# Patient Record
Sex: Male | Born: 1935 | Race: Black or African American | Hispanic: No | Marital: Married | Smoking: Former smoker
Health system: Southern US, Community
[De-identification: ages and names within clinical notes are randomized; demographics above are authoritative.]

## PROBLEM LIST (undated history)

## (undated) DIAGNOSIS — I10 Essential (primary) hypertension: Secondary | ICD-10-CM

## (undated) DIAGNOSIS — N400 Enlarged prostate without lower urinary tract symptoms: Secondary | ICD-10-CM

## (undated) DIAGNOSIS — Z720 Tobacco use: Secondary | ICD-10-CM

## (undated) DIAGNOSIS — F1910 Other psychoactive substance abuse, uncomplicated: Secondary | ICD-10-CM

## (undated) DIAGNOSIS — C8190 Hodgkin lymphoma, unspecified, unspecified site: Secondary | ICD-10-CM

## (undated) DIAGNOSIS — IMO0001 Reserved for inherently not codable concepts without codable children: Secondary | ICD-10-CM

## (undated) DIAGNOSIS — Z86010 Personal history of colonic polyps: Secondary | ICD-10-CM

## (undated) DIAGNOSIS — J449 Chronic obstructive pulmonary disease, unspecified: Secondary | ICD-10-CM

## (undated) DIAGNOSIS — R011 Cardiac murmur, unspecified: Secondary | ICD-10-CM

## (undated) DIAGNOSIS — F1011 Alcohol abuse, in remission: Secondary | ICD-10-CM

## (undated) DIAGNOSIS — Z862 Personal history of diseases of the blood and blood-forming organs and certain disorders involving the immune mechanism: Secondary | ICD-10-CM

## (undated) DIAGNOSIS — Z8719 Personal history of other diseases of the digestive system: Secondary | ICD-10-CM

## (undated) DIAGNOSIS — M1990 Unspecified osteoarthritis, unspecified site: Secondary | ICD-10-CM

## (undated) DIAGNOSIS — F32A Depression, unspecified: Secondary | ICD-10-CM

## (undated) DIAGNOSIS — K219 Gastro-esophageal reflux disease without esophagitis: Secondary | ICD-10-CM

## (undated) DIAGNOSIS — I219 Acute myocardial infarction, unspecified: Secondary | ICD-10-CM

## (undated) DIAGNOSIS — K5790 Diverticulosis of intestine, part unspecified, without perforation or abscess without bleeding: Secondary | ICD-10-CM

## (undated) DIAGNOSIS — K644 Residual hemorrhoidal skin tags: Secondary | ICD-10-CM

## (undated) DIAGNOSIS — E785 Hyperlipidemia, unspecified: Secondary | ICD-10-CM

## (undated) DIAGNOSIS — F329 Major depressive disorder, single episode, unspecified: Secondary | ICD-10-CM

## (undated) DIAGNOSIS — K279 Peptic ulcer, site unspecified, unspecified as acute or chronic, without hemorrhage or perforation: Secondary | ICD-10-CM

## (undated) DIAGNOSIS — K5900 Constipation, unspecified: Secondary | ICD-10-CM

## (undated) HISTORY — DX: Personal history of diseases of the blood and blood-forming organs and certain disorders involving the immune mechanism: Z86.2

## (undated) HISTORY — DX: Benign prostatic hyperplasia without lower urinary tract symptoms: N40.0

## (undated) HISTORY — DX: Unspecified osteoarthritis, unspecified site: M19.90

## (undated) HISTORY — DX: Tobacco use: Z72.0

## (undated) HISTORY — DX: Other psychoactive substance abuse, uncomplicated: F19.10

## (undated) HISTORY — DX: Alcohol abuse, in remission: F10.11

## (undated) HISTORY — DX: Hodgkin lymphoma, unspecified, unspecified site: C81.90

## (undated) HISTORY — DX: Cardiac murmur, unspecified: R01.1

## (undated) HISTORY — PX: ESOPHAGEAL DILATION: SHX303

## (undated) HISTORY — DX: Residual hemorrhoidal skin tags: K64.4

## (undated) HISTORY — DX: Chronic obstructive pulmonary disease, unspecified: J44.9

## (undated) HISTORY — DX: Peptic ulcer, site unspecified, unspecified as acute or chronic, without hemorrhage or perforation: K27.9

## (undated) HISTORY — DX: Hyperlipidemia, unspecified: E78.5

## (undated) HISTORY — PX: INGUINAL HERNIA REPAIR: SHX194

## (undated) HISTORY — DX: Essential (primary) hypertension: I10

## (undated) HISTORY — PX: OTHER SURGICAL HISTORY: SHX169

## (undated) HISTORY — DX: Diverticulosis of intestine, part unspecified, without perforation or abscess without bleeding: K57.90

## (undated) HISTORY — DX: Personal history of colonic polyps: Z86.010

## (undated) HISTORY — DX: Gastro-esophageal reflux disease without esophagitis: K21.9

## (undated) HISTORY — DX: Acute myocardial infarction, unspecified: I21.9

---

## 1997-12-29 ENCOUNTER — Emergency Department (HOSPITAL_COMMUNITY): Admission: EM | Admit: 1997-12-29 | Discharge: 1997-12-29 | Payer: Self-pay | Admitting: Emergency Medicine

## 1998-01-11 ENCOUNTER — Ambulatory Visit (HOSPITAL_COMMUNITY): Admission: RE | Admit: 1998-01-11 | Discharge: 1998-01-11 | Payer: Self-pay | Admitting: General Surgery

## 1999-02-20 ENCOUNTER — Encounter: Payer: Self-pay | Admitting: Cardiology

## 1999-02-20 ENCOUNTER — Ambulatory Visit (HOSPITAL_COMMUNITY): Admission: RE | Admit: 1999-02-20 | Discharge: 1999-02-20 | Payer: Self-pay | Admitting: Cardiology

## 2001-09-08 ENCOUNTER — Encounter: Payer: Self-pay | Admitting: Emergency Medicine

## 2001-09-08 ENCOUNTER — Inpatient Hospital Stay (HOSPITAL_COMMUNITY): Admission: EM | Admit: 2001-09-08 | Discharge: 2001-09-09 | Payer: Self-pay | Admitting: Emergency Medicine

## 2001-09-09 ENCOUNTER — Encounter: Payer: Self-pay | Admitting: Cardiology

## 2001-09-27 ENCOUNTER — Ambulatory Visit (HOSPITAL_COMMUNITY): Admission: RE | Admit: 2001-09-27 | Discharge: 2001-09-27 | Payer: Self-pay | Admitting: Cardiology

## 2002-03-08 HISTORY — PX: UPPER GASTROINTESTINAL ENDOSCOPY: SHX188

## 2002-03-08 HISTORY — PX: COLONOSCOPY: SHX5424

## 2002-08-16 ENCOUNTER — Encounter: Payer: Self-pay | Admitting: Internal Medicine

## 2002-08-16 ENCOUNTER — Encounter: Admission: RE | Admit: 2002-08-16 | Discharge: 2002-08-16 | Payer: Self-pay | Admitting: Internal Medicine

## 2008-12-23 ENCOUNTER — Emergency Department (HOSPITAL_COMMUNITY): Admission: EM | Admit: 2008-12-23 | Discharge: 2008-12-23 | Payer: Self-pay | Admitting: Emergency Medicine

## 2009-04-27 HISTORY — PX: LUNG SURGERY: SHX703

## 2009-05-20 ENCOUNTER — Encounter (HOSPITAL_COMMUNITY)
Admission: RE | Admit: 2009-05-20 | Discharge: 2009-08-02 | Payer: Self-pay | Source: Home / Self Care | Admitting: Cardiology

## 2009-07-23 ENCOUNTER — Ambulatory Visit (HOSPITAL_COMMUNITY): Admission: RE | Admit: 2009-07-23 | Discharge: 2009-07-23 | Payer: Self-pay | Admitting: Cardiology

## 2009-07-26 DIAGNOSIS — C8190 Hodgkin lymphoma, unspecified, unspecified site: Secondary | ICD-10-CM

## 2009-07-26 HISTORY — DX: Hodgkin lymphoma, unspecified, unspecified site: C81.90

## 2009-07-30 ENCOUNTER — Observation Stay (HOSPITAL_COMMUNITY): Admission: EM | Admit: 2009-07-30 | Discharge: 2009-08-04 | Payer: Self-pay | Admitting: Emergency Medicine

## 2009-07-31 ENCOUNTER — Encounter (INDEPENDENT_AMBULATORY_CARE_PROVIDER_SITE_OTHER): Payer: Self-pay | Admitting: Internal Medicine

## 2009-11-19 ENCOUNTER — Ambulatory Visit: Payer: Self-pay | Admitting: Oncology

## 2009-11-19 ENCOUNTER — Ambulatory Visit: Payer: Self-pay | Admitting: Pulmonary Disease

## 2009-11-19 ENCOUNTER — Inpatient Hospital Stay (HOSPITAL_COMMUNITY): Admission: EM | Admit: 2009-11-19 | Discharge: 2009-11-26 | Payer: Self-pay | Admitting: Emergency Medicine

## 2009-11-21 ENCOUNTER — Ambulatory Visit: Payer: Self-pay | Admitting: Cardiothoracic Surgery

## 2009-11-22 ENCOUNTER — Encounter: Payer: Self-pay | Admitting: Cardiothoracic Surgery

## 2009-11-26 ENCOUNTER — Ambulatory Visit: Payer: Self-pay | Admitting: Oncology

## 2009-12-02 ENCOUNTER — Ambulatory Visit: Payer: Self-pay | Admitting: Oncology

## 2009-12-02 LAB — CBC WITH DIFFERENTIAL/PLATELET
BASO%: 0.3 % (ref 0.0–2.0)
Basophils Absolute: 0 10*3/uL (ref 0.0–0.1)
EOS%: 2 % (ref 0.0–7.0)
HCT: 34.8 % — ABNORMAL LOW (ref 38.4–49.9)
HGB: 10.9 g/dL — ABNORMAL LOW (ref 13.0–17.1)
MCH: 28.6 pg (ref 27.2–33.4)
MONO#: 0.7 10*3/uL (ref 0.1–0.9)
NEUT%: 59.2 % (ref 39.0–75.0)
RDW: 14.9 % — ABNORMAL HIGH (ref 11.0–14.6)
WBC: 6.9 10*3/uL (ref 4.0–10.3)
lymph#: 1.9 10*3/uL (ref 0.9–3.3)

## 2009-12-02 LAB — URIC ACID: Uric Acid, Serum: 6.2 mg/dL (ref 4.0–7.8)

## 2009-12-02 LAB — COMPREHENSIVE METABOLIC PANEL
ALT: 40 U/L (ref 0–53)
AST: 18 U/L (ref 0–37)
CO2: 29 mEq/L (ref 19–32)
Creatinine, Ser: 0.99 mg/dL (ref 0.40–1.50)
Sodium: 140 mEq/L (ref 135–145)
Total Bilirubin: 0.3 mg/dL (ref 0.3–1.2)
Total Protein: 6.6 g/dL (ref 6.0–8.3)

## 2009-12-02 LAB — LACTATE DEHYDROGENASE: LDH: 210 U/L (ref 94–250)

## 2009-12-03 ENCOUNTER — Ambulatory Visit (HOSPITAL_COMMUNITY): Admission: RE | Admit: 2009-12-03 | Discharge: 2009-12-03 | Payer: Self-pay | Admitting: Cardiothoracic Surgery

## 2009-12-05 ENCOUNTER — Ambulatory Visit: Payer: Self-pay | Admitting: Cardiothoracic Surgery

## 2009-12-11 ENCOUNTER — Ambulatory Visit: Payer: Self-pay | Admitting: Cardiothoracic Surgery

## 2009-12-11 ENCOUNTER — Encounter: Admission: RE | Admit: 2009-12-11 | Discharge: 2009-12-11 | Payer: Self-pay | Admitting: Cardiothoracic Surgery

## 2010-01-14 ENCOUNTER — Ambulatory Visit: Payer: Self-pay | Admitting: Oncology

## 2010-01-17 LAB — CBC WITH DIFFERENTIAL/PLATELET
HCT: 38.9 % (ref 38.4–49.9)
HGB: 12.3 g/dL — ABNORMAL LOW (ref 13.0–17.1)
LYMPH%: 50.4 % — ABNORMAL HIGH (ref 14.0–49.0)
MCH: 28.3 pg (ref 27.2–33.4)
NEUT#: 2 10*3/uL (ref 1.5–6.5)
NEUT%: 37.4 % — ABNORMAL LOW (ref 39.0–75.0)
RBC: 4.35 10*6/uL (ref 4.20–5.82)
RDW: 14.3 % (ref 11.0–14.6)
WBC: 5.3 10*3/uL (ref 4.0–10.3)
lymph#: 2.7 10*3/uL (ref 0.9–3.3)

## 2010-01-17 LAB — COMPREHENSIVE METABOLIC PANEL
BUN: 12 mg/dL (ref 6–23)
CO2: 27 mEq/L (ref 19–32)
Calcium: 10 mg/dL (ref 8.4–10.5)
Chloride: 105 mEq/L (ref 96–112)
Creatinine, Ser: 0.97 mg/dL (ref 0.40–1.50)
Glucose, Bld: 86 mg/dL (ref 70–99)
Total Bilirubin: 0.5 mg/dL (ref 0.3–1.2)

## 2010-01-17 LAB — SEDIMENTATION RATE: Sed Rate: 13 mm/hr (ref 0–16)

## 2010-03-19 ENCOUNTER — Ambulatory Visit: Payer: Self-pay | Admitting: Oncology

## 2010-06-06 ENCOUNTER — Other Ambulatory Visit (HOSPITAL_COMMUNITY): Payer: Self-pay | Admitting: Oncology

## 2010-06-06 ENCOUNTER — Encounter (HOSPITAL_BASED_OUTPATIENT_CLINIC_OR_DEPARTMENT_OTHER): Payer: PRIVATE HEALTH INSURANCE | Admitting: Oncology

## 2010-06-06 DIAGNOSIS — C8590 Non-Hodgkin lymphoma, unspecified, unspecified site: Secondary | ICD-10-CM

## 2010-06-06 DIAGNOSIS — C8194 Hodgkin lymphoma, unspecified, lymph nodes of axilla and upper limb: Secondary | ICD-10-CM

## 2010-06-06 LAB — COMPREHENSIVE METABOLIC PANEL
Albumin: 4.4 g/dL (ref 3.5–5.2)
Chloride: 106 mEq/L (ref 96–112)
Glucose, Bld: 115 mg/dL — ABNORMAL HIGH (ref 70–99)
Potassium: 4.6 mEq/L (ref 3.5–5.3)
Sodium: 142 mEq/L (ref 135–145)
Total Bilirubin: 0.3 mg/dL (ref 0.3–1.2)

## 2010-06-06 LAB — CBC WITH DIFFERENTIAL/PLATELET
BASO%: 0.3 % (ref 0.0–2.0)
EOS%: 1.2 % (ref 0.0–7.0)
HCT: 40 % (ref 38.4–49.9)
MCV: 89.7 fL (ref 79.3–98.0)
NEUT#: 1.8 10*3/uL (ref 1.5–6.5)
Platelets: 140 10*3/uL (ref 140–400)
RDW: 15.9 % — ABNORMAL HIGH (ref 11.0–14.6)
WBC: 4 10*3/uL (ref 4.0–10.3)

## 2010-06-06 LAB — TECHNOLOGIST REVIEW

## 2010-06-06 LAB — SEDIMENTATION RATE: Sed Rate: 6 mm/hr (ref 0–16)

## 2010-06-19 ENCOUNTER — Other Ambulatory Visit (HOSPITAL_COMMUNITY): Payer: PRIVATE HEALTH INSURANCE

## 2010-06-20 ENCOUNTER — Other Ambulatory Visit (HOSPITAL_COMMUNITY): Payer: Self-pay | Admitting: Oncology

## 2010-06-20 DIAGNOSIS — C8590 Non-Hodgkin lymphoma, unspecified, unspecified site: Secondary | ICD-10-CM

## 2010-06-26 ENCOUNTER — Other Ambulatory Visit (HOSPITAL_COMMUNITY): Payer: Self-pay | Admitting: Oncology

## 2010-06-26 ENCOUNTER — Ambulatory Visit (HOSPITAL_COMMUNITY)
Admission: RE | Admit: 2010-06-26 | Discharge: 2010-06-26 | Disposition: A | Discharge status: Home / Self Care | Payer: Medicare Other | Source: Ambulatory Visit | Attending: Oncology | Admitting: Oncology

## 2010-06-26 ENCOUNTER — Other Ambulatory Visit (HOSPITAL_COMMUNITY): Payer: PRIVATE HEALTH INSURANCE

## 2010-06-26 DIAGNOSIS — K449 Diaphragmatic hernia without obstruction or gangrene: Secondary | ICD-10-CM | POA: Insufficient documentation

## 2010-06-26 DIAGNOSIS — C8588 Other specified types of non-Hodgkin lymphoma, lymph nodes of multiple sites: Secondary | ICD-10-CM | POA: Insufficient documentation

## 2010-06-26 DIAGNOSIS — N323 Diverticulum of bladder: Secondary | ICD-10-CM | POA: Insufficient documentation

## 2010-06-26 DIAGNOSIS — C8590 Non-Hodgkin lymphoma, unspecified, unspecified site: Secondary | ICD-10-CM

## 2010-06-26 DIAGNOSIS — K4020 Bilateral inguinal hernia, without obstruction or gangrene, not specified as recurrent: Secondary | ICD-10-CM | POA: Insufficient documentation

## 2010-06-26 DIAGNOSIS — Z23 Encounter for immunization: Secondary | ICD-10-CM

## 2010-06-26 MED ORDER — IOHEXOL 300 MG/ML  SOLN
100.0000 mL | Freq: Once | INTRAMUSCULAR | Status: AC | PRN
  Administered 2010-06-26: 100 mL via INTRAVENOUS

## 2010-07-11 LAB — GLUCOSE, CAPILLARY: Glucose-Capillary: 120 mg/dL — ABNORMAL HIGH (ref 70–99)

## 2010-07-12 LAB — BASIC METABOLIC PANEL
BUN: 10 mg/dL (ref 6–23)
CO2: 28 mEq/L (ref 19–32)
CO2: 29 mEq/L (ref 19–32)
CO2: 29 mEq/L (ref 19–32)
Calcium: 8.2 mg/dL — ABNORMAL LOW (ref 8.4–10.5)
Calcium: 9 mg/dL (ref 8.4–10.5)
Chloride: 103 mEq/L (ref 96–112)
Chloride: 105 mEq/L (ref 96–112)
Creatinine, Ser: 0.88 mg/dL (ref 0.4–1.5)
Creatinine, Ser: 0.97 mg/dL (ref 0.4–1.5)
Creatinine, Ser: 1.02 mg/dL (ref 0.4–1.5)
GFR calc Af Amer: >60 mL/min (ref >60)
GFR calc Af Amer: >60 mL/min (ref >60)
GFR calc non Af Amer: >60 mL/min (ref >60)
Glucose, Bld: 152 mg/dL — ABNORMAL HIGH (ref 70–99)
Glucose, Bld: 153 mg/dL — ABNORMAL HIGH (ref 70–99)
Potassium: 4.2 mEq/L (ref 3.5–5.1)
Potassium: 4.4 mEq/L (ref 3.5–5.1)
Sodium: 136 mEq/L (ref 135–145)
Sodium: 139 mEq/L (ref 135–145)

## 2010-07-12 LAB — COMPREHENSIVE METABOLIC PANEL
ALT: 34 U/L (ref 0–53)
AST: 42 U/L — ABNORMAL HIGH (ref 0–37)
Albumin: 2.6 g/dL — ABNORMAL LOW (ref 3.5–5.2)
Alkaline Phosphatase: 47 U/L (ref 39–117)
BUN: 11 mg/dL (ref 6–23)
CO2: 30 mEq/L (ref 19–32)
Calcium: 8.7 mg/dL (ref 8.4–10.5)
Chloride: 102 mEq/L (ref 96–112)
Creatinine, Ser: 0.93 mg/dL (ref 0.4–1.5)
GFR calc Af Amer: >60 mL/min (ref >60)
GFR calc non Af Amer: >60 mL/min (ref >60)
Glucose, Bld: 131 mg/dL — ABNORMAL HIGH (ref 70–99)
Potassium: 3.9 mEq/L (ref 3.5–5.1)
Sodium: 138 mEq/L (ref 135–145)
Total Bilirubin: 0.6 mg/dL (ref 0.3–1.2)
Total Protein: 5.6 g/dL — ABNORMAL LOW (ref 6.0–8.3)

## 2010-07-12 LAB — DIFFERENTIAL
Basophils Relative: 0 % (ref 0–1)
Eosinophils Absolute: 0.1 10*3/uL (ref 0.0–0.7)
Monocytes Relative: 8 % (ref 3–12)
Neutrophils Relative %: 27 % — ABNORMAL LOW (ref 43–77)

## 2010-07-12 LAB — BLOOD GAS, ARTERIAL
Acid-Base Excess: 0.9 mmol/L (ref 0.0–2.0)
Acid-Base Excess: 1.2 mmol/L (ref 0.0–2.0)
Bicarbonate: 26.1 mEq/L — ABNORMAL HIGH (ref 20.0–24.0)
Bicarbonate: 27.5 mEq/L — ABNORMAL HIGH (ref 20.0–24.0)
Drawn by: 32526
Drawn by: 325261
FIO2: 0.4 %
FIO2: 1 %
O2 Saturation: 87.2 %
O2 Saturation: 98.2 %
Patient temperature: 98.6
Patient temperature: 98.6
TCO2: 27.7 mmol/L (ref 0–100)
TCO2: 29.4 mmol/L (ref 0–100)
pCO2 arterial: 50.5 mmHg — ABNORMAL HIGH (ref 35.0–45.0)
pCO2 arterial: 62.7 mmHg (ref 35.0–45.0)
pH, Arterial: 7.264 — ABNORMAL LOW (ref 7.350–7.450)
pH, Arterial: 7.334 — ABNORMAL LOW (ref 7.350–7.450)
pO2, Arterial: 126 mmHg — ABNORMAL HIGH (ref 80.0–100.0)
pO2, Arterial: 63.9 mmHg — ABNORMAL LOW (ref 80.0–100.0)

## 2010-07-12 LAB — CARDIAC PANEL(CRET KIN+CKTOT+MB+TROPI)
CK, MB: 2.6 ng/mL (ref 0.3–4.0)
CK, MB: 4.7 ng/mL — ABNORMAL HIGH (ref 0.3–4.0)
Relative Index: 0.4 (ref 0.0–2.5)
Relative Index: 0.8 (ref 0.0–2.5)
Total CK: 595 U/L — ABNORMAL HIGH (ref 7–232)
Total CK: 601 U/L — ABNORMAL HIGH (ref 7–232)
Troponin I: 0.04 ng/mL (ref 0.00–0.06)
Troponin I: 0.04 ng/mL (ref 0.00–0.06)

## 2010-07-12 LAB — TYPE AND SCREEN
ABO/RH(D): O POS
Antibody Screen: NEGATIVE

## 2010-07-12 LAB — CBC
HCT: 30.7 % — ABNORMAL LOW (ref 39.0–52.0)
HCT: 32.2 % — ABNORMAL LOW (ref 39.0–52.0)
HCT: 35.9 % — ABNORMAL LOW (ref 39.0–52.0)
HCT: 37.7 % — ABNORMAL LOW (ref 39.0–52.0)
Hemoglobin: 10.2 g/dL — ABNORMAL LOW (ref 13.0–17.0)
Hemoglobin: 10.3 g/dL — ABNORMAL LOW (ref 13.0–17.0)
Hemoglobin: 11.9 g/dL — ABNORMAL LOW (ref 13.0–17.0)
Hemoglobin: 12 g/dL — ABNORMAL LOW (ref 13.0–17.0)
Hemoglobin: 13.7 g/dL (ref 13.0–17.0)
MCH: 29.4 pg (ref 26.0–34.0)
MCH: 29.5 pg (ref 26.0–34.0)
MCH: 30 pg (ref 26.0–34.0)
MCH: 30 pg (ref 26.0–34.0)
MCH: 30.3 pg (ref 26.0–34.0)
MCHC: 32 g/dL (ref 30.0–36.0)
MCHC: 32.6 g/dL (ref 30.0–36.0)
MCHC: 33.3 g/dL (ref 30.0–36.0)
MCHC: 33.3 g/dL (ref 30.0–36.0)
MCV: 90.2 fL (ref 78.0–100.0)
MCV: 91.1 fL (ref 78.0–100.0)
MCV: 92 fL (ref 78.0–100.0)
MCV: 92 fL (ref 78.0–100.0)
Platelets: 112 10*3/uL — ABNORMAL LOW (ref 150–400)
Platelets: 123 10*3/uL — ABNORMAL LOW (ref 150–400)
Platelets: 137 10*3/uL — ABNORMAL LOW (ref 150–400)
Platelets: 164 10*3/uL (ref 150–400)
RBC: 3.37 MIL/uL — ABNORMAL LOW (ref 4.22–5.81)
RBC: 3.5 MIL/uL — ABNORMAL LOW (ref 4.22–5.81)
RBC: 3.98 MIL/uL — ABNORMAL LOW (ref 4.22–5.81)
RBC: 4.09 MIL/uL — ABNORMAL LOW (ref 4.22–5.81)
RDW: 15.3 % (ref 11.5–15.5)
RDW: 15.7 % — ABNORMAL HIGH (ref 11.5–15.5)
RDW: 15.8 % — ABNORMAL HIGH (ref 11.5–15.5)
WBC: 5.9 10*3/uL (ref 4.0–10.5)
WBC: 6.8 10*3/uL (ref 4.0–10.5)
WBC: 9.7 10*3/uL (ref 4.0–10.5)

## 2010-07-12 LAB — URINALYSIS, ROUTINE W REFLEX MICROSCOPIC
Bilirubin Urine: NEGATIVE
Glucose, UA: NEGATIVE mg/dL
Hgb urine dipstick: NEGATIVE
Ketones, ur: NEGATIVE mg/dL
Nitrite: NEGATIVE
Protein, ur: NEGATIVE mg/dL
Specific Gravity, Urine: 1.007 (ref 1.005–1.030)
Urobilinogen, UA: 0.2 mg/dL (ref 0.0–1.0)
pH: 7 (ref 5.0–8.0)

## 2010-07-12 LAB — POCT I-STAT 3, ART BLOOD GAS (G3+)
Bicarbonate: 28.4 mEq/L — ABNORMAL HIGH (ref 20.0–24.0)
O2 Saturation: 96 %
Patient temperature: 100.3
Patient temperature: 97.7
TCO2: 30 mmol/L (ref 0–100)
pCO2 arterial: 48.8 mmHg — ABNORMAL HIGH (ref 35.0–45.0)
pH, Arterial: 7.369 (ref 7.350–7.450)

## 2010-07-12 LAB — PROTIME-INR
INR: 1.05 (ref 0.00–1.49)
Prothrombin Time: 13.6 seconds (ref 11.6–15.2)

## 2010-07-12 LAB — MRSA PCR SCREENING: MRSA by PCR: NEGATIVE

## 2010-07-12 LAB — APTT: aPTT: 29 seconds (ref 24–37)

## 2010-07-12 LAB — HEPATIC FUNCTION PANEL
Alkaline Phosphatase: 48 U/L (ref 39–117)
Bilirubin, Direct: 0.3 mg/dL (ref 0.0–0.3)
Indirect Bilirubin: 0.4 mg/dL (ref 0.3–0.9)
Total Protein: 6.8 g/dL (ref 6.0–8.3)

## 2010-07-12 LAB — URINE CULTURE
Colony Count: NO GROWTH
Culture: NO GROWTH
Special Requests: NEGATIVE

## 2010-07-12 LAB — LACTATE DEHYDROGENASE: LDH: 331 U/L — ABNORMAL HIGH (ref 94–250)

## 2010-07-12 LAB — CK TOTAL AND CKMB (NOT AT ARMC)
CK, MB: 4.3 ng/mL — ABNORMAL HIGH (ref 0.3–4.0)
Relative Index: 1 (ref 0.0–2.5)
Total CK: 440 U/L — ABNORMAL HIGH (ref 7–232)

## 2010-07-12 LAB — POCT CARDIAC MARKERS: Myoglobin, poc: 104 ng/mL (ref 12–200)

## 2010-07-16 LAB — CBC
HCT: 35.7 % — ABNORMAL LOW (ref 39.0–52.0)
Hemoglobin: 11.6 g/dL — ABNORMAL LOW (ref 13.0–17.0)
Hemoglobin: 12.7 g/dL — ABNORMAL LOW (ref 13.0–17.0)
MCHC: 32.5 g/dL (ref 30.0–36.0)
RBC: 4.18 MIL/uL — ABNORMAL LOW (ref 4.22–5.81)
WBC: 6.1 10*3/uL (ref 4.0–10.5)

## 2010-07-16 LAB — DIFFERENTIAL
Basophils Relative: 0 % (ref 0–1)
Lymphocytes Relative: 30 % (ref 12–46)
Lymphs Abs: 1.8 10*3/uL (ref 0.7–4.0)
Monocytes Absolute: 0.7 10*3/uL (ref 0.1–1.0)
Monocytes Relative: 11 % (ref 3–12)
Neutro Abs: 3.5 10*3/uL (ref 1.7–7.7)
Neutrophils Relative %: 57 % (ref 43–77)

## 2010-07-16 LAB — BASIC METABOLIC PANEL
BUN: 9 mg/dL (ref 6–23)
CO2: 26 mEq/L (ref 19–32)
CO2: 28 mEq/L (ref 19–32)
Calcium: 9.3 mg/dL (ref 8.4–10.5)
Creatinine, Ser: 0.91 mg/dL (ref 0.4–1.5)
GFR calc Af Amer: >60 mL/min (ref >60)
GFR calc non Af Amer: >60 mL/min (ref >60)
Glucose, Bld: 94 mg/dL (ref 70–99)
Potassium: 4.1 mEq/L (ref 3.5–5.1)
Sodium: 137 mEq/L (ref 135–145)
Sodium: 139 mEq/L (ref 135–145)

## 2010-07-16 LAB — PROTIME-INR
INR: 1.05 (ref 0.00–1.49)
Prothrombin Time: 13.6 seconds (ref 11.6–15.2)

## 2010-07-16 LAB — IRON AND TIBC
Iron: 33 ug/dL — ABNORMAL LOW (ref 42–135)
TIBC: 233 ug/dL (ref 215–435)

## 2010-07-16 LAB — BLOOD GAS, ARTERIAL
Bicarbonate: 28 mEq/L — ABNORMAL HIGH (ref 20.0–24.0)
Drawn by: 280971
FIO2: 0.21 %
O2 Saturation: 89.8 %
Patient temperature: 98.6
pH, Arterial: 7.406 (ref 7.350–7.450)

## 2010-07-16 LAB — URINALYSIS, ROUTINE W REFLEX MICROSCOPIC
Hgb urine dipstick: NEGATIVE
Nitrite: NEGATIVE
Protein, ur: NEGATIVE mg/dL
Specific Gravity, Urine: 1.018 (ref 1.005–1.030)
Urobilinogen, UA: 1 mg/dL (ref 0.0–1.0)

## 2010-07-16 LAB — BRAIN NATRIURETIC PEPTIDE: Pro B Natriuretic peptide (BNP): 130 pg/mL — ABNORMAL HIGH (ref 0.0–100.0)

## 2010-07-16 LAB — RETICULOCYTES: Retic Ct Pct: 1.3 % (ref 0.4–3.1)

## 2010-07-16 LAB — HIV ANTIBODY (ROUTINE TESTING W REFLEX): HIV: NONREACTIVE

## 2010-07-16 LAB — RPR: RPR Ser Ql: NONREACTIVE

## 2010-07-16 LAB — URINE CULTURE

## 2010-07-16 LAB — LIPID PANEL
Cholesterol: 141 mg/dL (ref 0–200)
Total CHOL/HDL Ratio: 3.1 RATIO

## 2010-07-16 LAB — VITAMIN B12: Vitamin B-12: 594 pg/mL (ref 211–911)

## 2010-07-16 LAB — APTT: aPTT: 32 seconds (ref 24–37)

## 2010-07-16 LAB — POCT CARDIAC MARKERS
CKMB, poc: 2.9 ng/mL (ref 1.0–8.0)
Myoglobin, poc: 161 ng/mL (ref 12–200)
Troponin i, poc: <0.05 ng/mL (ref 0.00–0.09)

## 2010-07-16 LAB — FOLATE: Folate: 14.9 ng/mL

## 2010-07-16 LAB — CULTURE, BLOOD (ROUTINE X 2)

## 2010-07-16 LAB — HEMOCCULT GUIAC POC 1CARD (OFFICE): Fecal Occult Bld: NEGATIVE

## 2010-07-16 LAB — HSV(HERPES SIMPLEX VRS) I + II AB-IGG: Herpes Simplex Vrs I + II Ab, IgG: 17.1 IV — ABNORMAL HIGH

## 2010-09-09 NOTE — Assessment & Plan Note (Signed)
OFFICE VISIT   [name], [name]  DOB:  May 23, 1932                                        December 11, 2009  CHART #:  [id number]   PROBLEMS:  1. Status post left VATS resection of apical blebs and pleurodesis for      recurrent spontaneous left pneumothorax.  2. COPD, reformed smoker 3 years ago.  3. Recent recurrence of lymphoma treated by Dr. Arline Asp.   PRESENT ILLNESS:  The patient is a very nice 77 year old gentleman who  returns for his first office visit following hospitalization for a  spontaneous pneumothorax requiring a chest tube and subsequent VATS bleb  stapling.  The pathology on the specimens removed from lung were all  negative-inflammatory.  He was also noted to have a left axillary node  biopsy proven to be Hodgkin lymphoma that was not treated up until he  was hospitalized for his pneumothorax.  While he was in the hospital, he  finished his complete evaluation under the direction of Mursinson and a  plan of care was initiated for that problem.  He did well following his  VATS bleb resection and was discharged home in 3-4 days.  He has had no  more difficulty with chest pain, shortness of breath.  Incisions have  healed.  He is doing well at home on room air and his home oxygen is  being removed.  He is taking Ultram for pain.   PHYSICAL EXAMINATION:  Blood pressure 128/80, pulse 70, respirations 20,  saturation on room air 93-94%.  He is alert and pleasant.  Breath sounds  are clear and equal.  The left VATS incisions are all well healed.  Cardiac:  Rhythm is regular.   PA and lateral chest x-ray revealed some postoperative changes in the  left lung, but no recurrent pneumothorax.  He does have stable COPD  changes.   IMPRESSION AND PLAN:  I was told the patient that he could advance his  activity to include driving and lifting up to 10-15 pounds maximum.  He  knows he should continue light activity through September.  He will call  us  if he needs any more refills on the Ultram prescription for pain and  otherwise will be entering treatment under the direction of Dr. Arline Asp  for his probable recurrent lymphoma.   Kerin Perna, M.D.  Electronically Signed   PV/MEDQ  D:  12/11/2009  T:  12/11/2009  Job:  [id number]   cc:   Samul Dada, M.D.

## 2010-09-12 NOTE — Cardiovascular Report (Signed)
[place]. [facility]  Patient:    Jim Beck, Jim Beck Visit Number: [id number] MRN: [id number]          Service Type: CAT Location: [facility] 2852 [id number] Attending Physician:  Robynn Pane Dictated by:   Eduardo Osier Sharyn Lull, M.D. Proc. Date: 09/27/01 Admit Date:  09/27/2001 Discharge Date: 09/27/2001   CC:         Cardiac Catheterization Laboratory   Cardiac Catheterization  PROCEDURE: Left cardiac catheterization with selective left and right coronary angiography, left ventriculography via right groin using Judkins technique.  INDICATIONS FOR PROCEDURE: The patient is a 77 year old black male with a past medical history significant for hypertension, history of peptic ulcer disease, tobacco abuse, was recently discharged from the hospital, continues to complain of retrosternal and precordial burning chest pain associated with dizziness with exertion, relieved with rest. Denies any nausea, vomiting, diaphoresis. Denies palpitations, lightheadedness or syncope. The patient had Persantine Cardiolite approximately two weeks ago for ischemia and also had Persantine Cardiolite in October of 2000, which showed mild ischemia in the inferolateral wall with EF of 51%. The patient initially opted for medical management but due to recurrent chest pain now consents for left catheterization, possible PCI.  PAST MEDICAL HISTORY: As above.  PAST SURGICAL HISTORY: He had bilateral inguinal hernia repair many years ago. Had throat cancer at the age of 55.  ALLERGIES: He has an intolerance to ASPIRIN.  MEDICATIONS: 1. He is on enteric-coated aspirin 81 mg p.o. q.d. 2. Norvasc 5 mg p.o. q.d. 3. Protonix 40 mg p.o. q.d. 4. Lipitor 10 mg p.o. q.d. 5. Nitrostat 0.4 mg sublingual p.r.n.  SOCIAL HISTORY: He is married and has seven children. Smoked one pack-per-day for 40 years, quit 10 years ago. Used to drink socially for 40 years, quit 10 years ago. 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  FAMILY HISTORY: Positive for cancer. His father died of CA of stomach and one sister died of CA of stomach.  PHYSICAL EXAMINATION:  GENERAL: On examination, he is alert, awake, oriented x3 in no acute distress.   VITAL SIGNS: Blood pressure was 130/80, pulse was 68, regular.  HEENT: Conjunctiva pink.  NECK: Supple, no JVD, no bruits.  LUNGS: Lungs are clear to auscultation without rhonchi or rales.  CARDIOVASCULAR: S1 and S2 was normal. There was no S3 gallop.  ABDOMEN: Soft, bowel sounds are present, nontender.  EXTREMITIES: There was no clubbing, cyanosis or edema.  IMPRESSION: Recurrent chest pain, had history of mildly positive Persantine Cardiolite in the past, rule out coronary insufficiency, hypertension, hypercholesterolemia, history of tobacco abuse, history of alcohol abuse.  Discussed at length with patient regarding left catheterization, possible PTCA stenting, its risks, i.e. death, MI, stroke, need for emergency CABG, risk of restenosis, local vascular complications, etc., and consented for PCI.  DESCRIPTION OF PROCEDURE: After obtaining the informed consent, the patient was brought to the catheterization lab and was placed on the fluoroscopy table.  The right groin was prepped and draped in the usual fashion. Xylocaine 2% was used for local anesthesia in the right groin. With the help of a thin-walled needle, a 6 French arterial sheath was placed. The sheath was aspirated and flushed. Next, a 6 French left Judkins catheter was advanced over the wire under fluoroscopic guidance up to the ascending aorta. The wire was pulled out, the catheter was aspirated and connected to the manifold. The catheter was further advanced and engaged into the left coronary ostium. Multiple views of  the left system were taken. Next, the catheter was disengaged and was pulled out over the wire and was replaced with a 6 French right Judkins  catheter, which was advanced over the wire under fluoroscopic guidance up to the ascending aorta. The wire was pulled out, the catheter was aspirated and connected to the manifold. The catheter was further advanced and engaged into the right coronary ostium. Multiple views of the right system were taken. Next, the catheter was disengaged and was pulled out over the wire and was replaced with a 6 French pigtail catheter, which was advanced over the wire under fluoroscopic guidance up to the ascending aorta. The catheter was further advanced across the aortic valve into the LV. LV pressures were recorded. Next, left ventriculography was done in 30-degree RAO position. Post angiographic pressures were recorded from LV and then pullback pressures were recorded from the aorta. There was no gradient across the aortic valve. Next, the pigtail catheter was pulled out over the wire, the sheaths were aspirated and flushed.  FINDINGS: LV showed good left ventricular systolic function.  The left main was patent.  The LAD has 30% proximal stenosis with haziness with TIMI grade 3 distal flow. Diagonal #1 is a very small vessel, which is patent. Diagonal #2 to diagonal #4 are small, but they are patent.  Left circumflex is patent. OM-1 is very small. OM-2 is medium sized, which is patent. Dara Lords is patent.  Arteriotomy was closed with Perclose without any complications. The patient tolerated the procedure well and was transferred to recovery room in stable condition. Dictated by:   Eduardo Osier Sharyn Lull, M.D. Attending Physician:  Robynn Pane DD:  09/27/01 TD:  09/28/01 Job: [id number]

## 2010-09-12 NOTE — Discharge Summary (Signed)
[place]. [facility]  Patient:    Jim Beck, Jim Beck Visit Number: [id number] MRN: [id number]          Service Type: MED Attending Physician:  Robynn Pane Md Dictated by:   Eduardo Osier Sharyn Lull, M.D. Admit Date:  09/08/2001 Discharge Date: 09/09/2001                             Discharge Summary  ADMISSION DIAGNOSES 1. New onset angina, rule out coronaryinfection. 2. History of mildly positive Persantine Cardiolite in the past. 3. Uncontrolled hypertension. 4. History of tobacco abuse. 5. History of peptic ulcer disease.  FINAL DIAGNOSES 1. Stable angina. 2. Hypertension. 3. History of tobacco abuse. 4. Hypercholesterolemia. 5. History of peptic ulcer disease.  DISCHARGE HOME MEDICATIONS 1. Norvasc 5 mg 1 tablet q.d. 2. Baby aspirin 81 mg 1 tablet q.d. 3. Protonix 40 mg 1 tablet q.d. 4. Nitroglycerin 0.4 mg sublingual use as directed. 5. Lipitor 10 mg 1 tablet q.d.  DISCHARGE INSTRUCTIONS: Activity as tolerated. Diet low salt, low cholesterol.  FOLLOW UP: Follow up with me in two weeks.  CONDITION ON DISCHARGE: Stable.  HISTORY OF PRESENT ILLNESS: The patient is a 77 year old black male witha past medical history significant for hypertension, history of peptic ulcer disease, history of tobacco abuse, not on any medication. He came tothe ER complaining of retrosternal burning, chest pain radiating to the left arm which started around 9 p.m. last night, relieved with rest in 20to 30 minutes without associated symptoms, and again this morning had similar pain, so decided to come to the ER. The patient presently denies chest pain, nausea or vomiting, diaphoresis, head pain, orthopnea, leg swelling, denies palpitations, light headedness or syncope, denies relation of chest pain to full breathing or movement. The patient had similar pain in October 2000, and subsequently had Persantine Cardiolite which was mildly positive for reversible  ischemia in the anterior wall, but decided totreat medically.  PAST MEDICAL HISTORY: As above.  PAST SURGICAL HISTORY:  None.  MEDICATIONS: He stopped all his medication.  SOCIAL HISTORY: He is married and has seven children. He smoked 1 pack perday for 40 years, quit approximately 10 years ago. He used to drink socially for 40 plus years, quit 10 years ago. Retired, worked for [employer].  FAMILY HISTORY: Positive for cancer. He had a Persantine Cardiolite in October 2000, which showed mild reversible ischemia in the inferior wall and decided to treat medically.  Since then the patient hasbeen pain free until this chest pain.  PHYSICAL EXAMINATION: Onexamination he is alert, awake and oriented x 3, in no acute distress.  VITAL SIGNS: Blood pressure 153/93, pulse 66 and regular.  HEENT:  Normal.  NECK: Supple, no JVD, no bruits.  LUNGS: Clear to auscultation without rhonchi or rales.  CARDIOVASCULAR EXAMINATION:S1, S2 was normal. There was no S3, gallop.  ABDOMEN: Soft, bowel sounds were present, nontender.  EXTREMITIES: No cyanosis, clubbing or edema. Pedal pulses were 2+ bilaterally.  LABORATORY DATA: His EKG showed sinus bradycardia, LVH, nonspecific ST-T wave changes. His three sets of cardiac enzymes were negative. His LDH, his cholesterol was 331, LDL was 144, HDL of 59. His sodium was 136, potassium 4.0, bicarbonate 25, BUN 13, creatinine 1.0. Troponin I also three sets were negative. Hemoglobin was 12.7, hematocrit 38.7.  HOSPITAL COURSE: The patient was admitted to the telemetry unit and MI was ruled out by serial enzymes and EKG. Thepatient did not  have any episodes of chest pain during the hospital stay. The patient underwent Persantine Cardiolite today which showed no evidence of reversible ischemia or infarct, with an ejection fraction of 61%. The patient has been ambulating in the hallway without any problems.  The patient will be discharged to  home on the above medications and willbe followed up in my office in two weeks. The patient has been advised to keep nitroglycerin with him at all times. If he has recurrent chest pain, pressure, tightness, he should use nitro as directed and notify me. Dictated by:   Eduardo Osier Sharyn Lull, M.D. Attending Physician:Harwani, Eduardo Osier Md DD:  09/09/01 TD:  09/12/01 Job: [id number]

## 2010-12-31 ENCOUNTER — Other Ambulatory Visit: Payer: Self-pay | Admitting: Otolaryngology

## 2010-12-31 DIAGNOSIS — R1310 Dysphagia, unspecified: Secondary | ICD-10-CM

## 2011-02-05 ENCOUNTER — Ambulatory Visit (INDEPENDENT_AMBULATORY_CARE_PROVIDER_SITE_OTHER): Payer: Medicare Other | Admitting: Internal Medicine

## 2011-02-05 ENCOUNTER — Encounter: Payer: Self-pay | Admitting: Internal Medicine

## 2011-02-05 ENCOUNTER — Ambulatory Visit: Payer: Medicare Other | Admitting: Internal Medicine

## 2011-02-05 VITALS — BP 138/88 | HR 78 | Ht 71.0 in | Wt 176.4 lb

## 2011-02-05 DIAGNOSIS — R1314 Dysphagia, pharyngoesophageal phase: Secondary | ICD-10-CM

## 2011-02-05 DIAGNOSIS — K219 Gastro-esophageal reflux disease without esophagitis: Secondary | ICD-10-CM | POA: Insufficient documentation

## 2011-02-05 DIAGNOSIS — R1310 Dysphagia, unspecified: Secondary | ICD-10-CM

## 2011-02-05 NOTE — Progress Notes (Deleted)
  Subjective:    Patient ID: Jim Beck, male    DOB: 31-Jan-1933, 78 y.o.   MRN: [id number]  HPI    Review of Systems     Objective:   Physical Exam        Assessment & Plan:

## 2011-02-05 NOTE — Progress Notes (Signed)
  Subjective:     Jim Beck is a 77 y.o. male who presents for evaluation and management of a dysphagia. The dysphagia occurs with solid foods. Symptoms have been present for approximately 3-4 weeks. The symptoms are rapidly worsening. The dysphagia has been persistent and have been progressive.  He complains of frequent heartburn.  He denies melena, hematochezia, hematemesis, and coffee ground emesis.  This has been associated with early satiety.  He denies unexpected weight loss. Appetite also off. Also complains of rare intermittent blood on the toilet paper and some constipation associated with that.  The following portions of the patient's history were reviewed and updated as appropriate: allergies, current medications, past family history, past medical history, past social history, past surgical history and problem list.  Review of Systems A comprehensive review of systems was negative except for: Respiratory: positive for cough    Objective:    BP 138/88  Pulse 78  Ht 5\' 11"  (1.803 m)  Wt 176 lb 6.4 oz (80.015 kg)  BMI 24.60 kg/m2  SpO2 95% BP 138/88  Pulse 78  Ht 5\' 11"  (1.803 m)  Wt 176 lb 6.4 oz (80.015 kg)  BMI 24.60 kg/m2  SpO2 95%  General Appearance:    Alert, cooperative, no distress, appears stated age  Head:    Normocephalic, without obvious abnormality, atraumatic  Eyes:    PERRL, conjunctiva/corneas clear, + arcus       Ears:    Normal TM's and external ear canals, both ears     Throat:   Lips, mucosa, and tongue normal; teeth absent and gums normal  Neck:   Supple, symmetrical, trachea midline, no adenopathy;       thyroid:  No enlargement/tenderness/nodules; no carotid   bruit or JVD     Lungs:     Clear to auscultation bilaterally, respirations unlabored  Chest wall:    No tenderness or deformity  Heart:    Regular rate and rhythm, S1 and S2 normal, no murmur, rub   +s4  Abdomen:     Soft, non-tender, bowel sounds active all four quadrants,    no masses,  no organomegaly        Extremities:   Extremities normal, atraumatic, no cyanosis or edema        Lymph nodes:   Cervical, supraclavicular, and inguinal nodes normal   Laboratory Lab Results  Component Value Date   WBC 6.8 11/24/2009   RBC 3.37* 11/24/2009   HGB 12.9* 06/06/2010   HGB 10.2* 11/24/2009      Assessment:    Dysphagia - esophageal also has large hiatal hernia and history of Hodgkin's lymphoma.   Plan:    Schedule for upper endoscopy. The risks and benefits of my recommendations, as well as other treatment options were discussed with the patient today. Questions were answered. Follow up: as needed.

## 2011-02-05 NOTE — Patient Instructions (Signed)
You have been scheduled for an endoscopy. Please follow written instructions given to you at your visit today.  

## 2011-02-06 ENCOUNTER — Encounter: Payer: Self-pay | Admitting: Internal Medicine

## 2011-02-10 ENCOUNTER — Ambulatory Visit (AMBULATORY_SURGERY_CENTER): Payer: Medicare Other | Admitting: Internal Medicine

## 2011-02-10 ENCOUNTER — Encounter: Payer: Self-pay | Admitting: Internal Medicine

## 2011-02-10 DIAGNOSIS — R1314 Dysphagia, pharyngoesophageal phase: Secondary | ICD-10-CM

## 2011-02-10 DIAGNOSIS — K219 Gastro-esophageal reflux disease without esophagitis: Secondary | ICD-10-CM

## 2011-02-10 DIAGNOSIS — K222 Esophageal obstruction: Secondary | ICD-10-CM

## 2011-02-10 DIAGNOSIS — K449 Diaphragmatic hernia without obstruction or gangrene: Secondary | ICD-10-CM

## 2011-02-10 MED ORDER — LANSOPRAZOLE 30 MG PO CPDR: 30.0000 mg | DELAYED_RELEASE_CAPSULE | Freq: Every day | ORAL | Status: DC

## 2011-02-10 MED ORDER — SODIUM CHLORIDE 0.9 % IV SOLN: 500.0000 mL | INTRAVENOUS | Status: DC

## 2011-02-10 NOTE — Patient Instructions (Addendum)
There is a narrow area where the esophagus (food tube) and stomach meet. 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 It is caused by acid reflux. It was stretched or dilated today. It may need to be stretched again - will need to see how you are doing with your swallowing. I changed the lansoprazole or Prevacid to 30 mg daily - there is a new prescription at your pharmacy - pick it up this week and start the higher dose. This medication blocks acid and will keep the esophagus open. Do not stop it or run out!.  Follow the special diet provided today and then try normal consistency foods. Use your dentures if you can.  My office will call you to check on the swallowing in a few weeks, to see if you need to be stretched more - I suspect you will. If you do not hear from [place] by November 1, call us back. Iva Boop, MD, [facility]  Please follow the ESOPHAGEAL DILATION DIET as follows:  -Nothing by mouth until 4:30PM  -Clear Liquids for 1 hour 4:30PM-5:30pm  -Soft Foods for the rest of today 5:30PM until tomorrow

## 2011-02-11 ENCOUNTER — Telehealth: Payer: Self-pay | Admitting: *Deleted

## 2011-02-11 NOTE — Telephone Encounter (Signed)

## 2011-02-20 ENCOUNTER — Telehealth: Payer: Self-pay | Admitting: *Deleted

## 2011-02-20 ENCOUNTER — Inpatient Hospital Stay (INDEPENDENT_AMBULATORY_CARE_PROVIDER_SITE_OTHER)
Admission: RE | Admit: 2011-02-20 | Discharge: 2011-02-20 | Disposition: A | Discharge status: Home / Self Care | Payer: Medicare Other | Source: Ambulatory Visit | Attending: Family Medicine | Admitting: Family Medicine

## 2011-02-20 DIAGNOSIS — B029 Zoster without complications: Secondary | ICD-10-CM

## 2011-02-20 NOTE — Telephone Encounter (Signed)
Pt and a young relative walked into our office stating he had a medication reaction to Lansoprazole and showed me the bottle- he was started on this on 02/10/11 after a 02/05/11 OV. Pt had an EGD on that day. The rash looks like SHINGLES TO ME AND ANOTHER RN. We have no doctor or mid level in the office this am, so I am sending him to an UC.  I have called Dr Ferd Hibbs of[facility] and they haven't seen him for 5 years and per pt and the male her has no regular PCP. He has been seen by Dr Arline Asp for Lymphoma, but hasn't been seen since February, 2012. He was referred there by Dr Sharyn Lull, his cardiologist whom he last saw in August, 2012.  NO ONE that I spoke to ordered the Valtrex listed on the med sheet on OV 02/05/11.  I called [facility] and Dr Dolphus Jenny ordered it 08/04/2009. Please call me if you have any questions. Graciella Freer, RN [phone]  This note was sent with pt to an UC facility.

## 2011-02-24 ENCOUNTER — Telehealth: Payer: Self-pay

## 2011-02-24 NOTE — Telephone Encounter (Signed)
Message copied by Annett Fabian on Tue Feb 24, 2011  9:21 AM ------      Message from: Iva Boop      Created: Mon Feb 23, 2011  1:00 PM      Regarding: dysphagia and rash f/u       You may have this on your to do list but call him about      1) dysphagia - is it resolved or not      2) rash - what happened at urgent care            He is going to need a Cormier-term PPI - he thought lansoprazole caused a rash but sounds like he had shingles

## 2011-02-24 NOTE — Telephone Encounter (Signed)
Reports that his dysphagia is better on lansoprazole.  His shingles rash is also improving with the cream and acyclovir they started him on.

## 2011-03-03 ENCOUNTER — Ambulatory Visit: Payer: Medicare Other | Admitting: Internal Medicine

## 2012-02-17 ENCOUNTER — Emergency Department (HOSPITAL_COMMUNITY)
Admission: EM | Admit: 2012-02-17 | Discharge: 2012-02-17 | Disposition: A | Discharge status: Home / Self Care | Payer: Medicare Other | Attending: Emergency Medicine | Admitting: Emergency Medicine

## 2012-02-17 ENCOUNTER — Emergency Department (HOSPITAL_COMMUNITY): Payer: Medicare Other

## 2012-02-17 ENCOUNTER — Encounter (HOSPITAL_COMMUNITY): Payer: Self-pay | Admitting: Emergency Medicine

## 2012-02-17 DIAGNOSIS — R0602 Shortness of breath: Secondary | ICD-10-CM | POA: Insufficient documentation

## 2012-02-17 DIAGNOSIS — J4489 Other specified chronic obstructive pulmonary disease: Secondary | ICD-10-CM | POA: Insufficient documentation

## 2012-02-17 DIAGNOSIS — E785 Hyperlipidemia, unspecified: Secondary | ICD-10-CM | POA: Insufficient documentation

## 2012-02-17 DIAGNOSIS — Z8711 Personal history of peptic ulcer disease: Secondary | ICD-10-CM | POA: Insufficient documentation

## 2012-02-17 DIAGNOSIS — K219 Gastro-esophageal reflux disease without esophagitis: Secondary | ICD-10-CM | POA: Insufficient documentation

## 2012-02-17 DIAGNOSIS — I252 Old myocardial infarction: Secondary | ICD-10-CM | POA: Insufficient documentation

## 2012-02-17 DIAGNOSIS — Z79899 Other long term (current) drug therapy: Secondary | ICD-10-CM | POA: Insufficient documentation

## 2012-02-17 DIAGNOSIS — F17200 Nicotine dependence, unspecified, uncomplicated: Secondary | ICD-10-CM | POA: Insufficient documentation

## 2012-02-17 DIAGNOSIS — J449 Chronic obstructive pulmonary disease, unspecified: Secondary | ICD-10-CM | POA: Insufficient documentation

## 2012-02-17 DIAGNOSIS — T148XXA Other injury of unspecified body region, initial encounter: Secondary | ICD-10-CM

## 2012-02-17 DIAGNOSIS — K5730 Diverticulosis of large intestine without perforation or abscess without bleeding: Secondary | ICD-10-CM | POA: Insufficient documentation

## 2012-02-17 DIAGNOSIS — R0789 Other chest pain: Secondary | ICD-10-CM | POA: Insufficient documentation

## 2012-02-17 LAB — BASIC METABOLIC PANEL
BUN: 12 mg/dL (ref 6–23)
CO2: 26 mEq/L (ref 19–32)
Calcium: 9.3 mg/dL (ref 8.4–10.5)
Creatinine, Ser: 1.04 mg/dL (ref 0.50–1.35)
GFR calc non Af Amer: 67 mL/min — ABNORMAL LOW (ref >90)
Glucose, Bld: 86 mg/dL (ref 70–99)
Sodium: 142 mEq/L (ref 135–145)

## 2012-02-17 LAB — CBC
HCT: 34.1 % — ABNORMAL LOW (ref 39.0–52.0)
Hemoglobin: 10.9 g/dL — ABNORMAL LOW (ref 13.0–17.0)
MCH: 28.8 pg (ref 26.0–34.0)
MCHC: 32 g/dL (ref 30.0–36.0)
MCV: 90.2 fL (ref 78.0–100.0)
RBC: 3.78 MIL/uL — ABNORMAL LOW (ref 4.22–5.81)

## 2012-02-17 LAB — PROTIME-INR: INR: 0.98 (ref 0.00–1.49)

## 2012-02-17 MED ORDER — CYCLOBENZAPRINE HCL 10 MG PO TABS: 10.0000 mg | ORAL_TABLET | Freq: Two times a day (BID) | ORAL | Status: DC | PRN

## 2012-02-17 MED ORDER — DIAZEPAM 5 MG PO TABS
5.0000 mg | ORAL_TABLET | Freq: Once | ORAL | Status: AC
  Administered 2012-02-17: 5 mg via ORAL
  Filled 2012-02-17: qty 1

## 2012-02-17 NOTE — ED Notes (Signed)
Pt reports cleaning his carpet last night, started to have L mid back pain that radiates around to his L side chest.  Pt also reports mild SOB.  Pt has hx of MI x 2 years ago.  Pt is NAD.  No other complaints at this time.  Pt placed on cardiac monitor.  O2 2L [place] in place.  Continous pulse ox in place.

## 2012-02-17 NOTE — ED Notes (Signed)
Pt states he was working on his flooring last night when he started having chest pain and SOB.  Pt is dyspneic.  87% on RA.  Pain 9/10.  Hx of MI.

## 2012-02-17 NOTE — ED Provider Notes (Signed)
History     CSN: [id number]  Arrival date & time 02/17/12  1026   First MD Initiated Contact with Patient 02/17/12 1058      Chief Complaint  Patient presents with  . Chest Pain  . Shortness of Breath    (Consider location/radiation/quality/duration/timing/severity/associated sxs/prior treatment) HPI Comments: Jim Beck presents for evaluation of chest and back pain.  He states he picked up a carpet cleaning machine last night.  The pain began immediately after pivoting to place the machine on a different area of the floor.  He reports sudden onset of a spastic tightness and pain.  He states the pain has been waxing and waning depending on his activity level since.  He reports similar discomfort after a lung collapsed several years ago.  He denies any shortness of breath above his baseline dyspnea.  Patient is a 78 y.o. male presenting with chest pain and shortness of breath. The history is provided by the patient. No language interpreter was used.  Chest Pain The chest pain began yesterday. The chest pain is unchanged. The pain is associated with lifting, exertion and breathing. At its most intense, the pain is at 8/10. The pain is currently at 6/10. The severity of the pain is moderate. The quality of the pain is described as sharp, tightness and burning. The pain radiates to the upper back. Chest pain is worsened by certain positions, deep breathing and exertion. Primary symptoms include shortness of breath. Pertinent negatives for primary symptoms include no fever, no fatigue, no syncope, no cough, no wheezing, no palpitations, no abdominal pain, no nausea, no vomiting, no dizziness and no altered mental status.  Pertinent negatives for associated symptoms include no claudication, no diaphoresis, no lower extremity edema, no near-syncope, no numbness, no orthopnea, no paroxysmal nocturnal dyspnea and no weakness. He tried nothing for the symptoms. Risk factors include male gender, being  elderly and sedentary lifestyle.  His past medical history is significant for CAD, cancer, COPD, hyperlipidemia and hypertension.    Shortness of Breath  Associated symptoms include chest pain and shortness of breath. Pertinent negatives include no orthopnea, no fever, no cough and no wheezing.    Past Medical History  Diagnosis Date  . COPD (chronic obstructive pulmonary disease)   . Tobacco abuse   . Hypertension   . Hyperlipemia   . Hodgkin lymphoma 07/2009    head and neck  . GERD (gastroesophageal reflux disease)     large hiatal hernia and stricture  . Hx of blood diseases     bullous blood disease  . PUD (peptic ulcer disease)   . BPH (benign prostatic hypertrophy)   . History of alcohol abuse   . Heart murmur   . Diverticulosis   . External hemorrhoids   . Arthritis   . Myocardial infarction   . Substance abuse     Past Surgical History  Procedure Date  . Inguinal hernia repair     bilateral  . Upper gastrointestinal endoscopy 03/08/2002    esophageal stricture, hiatal hernia  . Colonoscopy 03/08/2002    diverticulosis, external hemorrhoids    Family History  Problem Relation Age of Onset  . Cancer Brother     abdominal  . Lung cancer Brother   . Throat cancer Sister     History  Substance Use Topics  . Smoking status: Former Games developer  . Smokeless tobacco: Never Used  . Alcohol Use: No      Review of Systems  Constitutional: Negative for fever,  diaphoresis and fatigue.  Respiratory: Positive for shortness of breath. Negative for cough and wheezing.   Cardiovascular: Positive for chest pain. Negative for palpitations, orthopnea, claudication, syncope and near-syncope.  Gastrointestinal: Negative for nausea, vomiting and abdominal pain.  Musculoskeletal: Positive for back pain.  Neurological: Negative for dizziness, weakness and numbness.  Psychiatric/Behavioral: Negative for altered mental status.  All other systems reviewed and are  negative.    Allergies  Aspirin  Home Medications   Current Outpatient Rx  Name Route Sig Dispense Refill  . AMLODIPINE BESYLATE 5 MG PO TABS Oral Take 5 mg by mouth daily.     Marland Kitchen DOCUSATE SODIUM 100 MG PO CAPS Oral Take 100 mg by mouth 2 (two) times daily as needed. Constipation    . LANSOPRAZOLE 30 MG PO CPDR Oral Take 1 capsule (30 mg total) by mouth daily. Take 30 minutes before breakfast 30 capsule 11  . ROSUVASTATIN CALCIUM 10 MG PO TABS Oral Take 10 mg by mouth every evening.     Marland Kitchen NITROGLYCERIN 0.4 MG SL SUBL Sublingual Place 0.4 mg under the tongue every 5 (five) minutes as needed.        BP 154/97  Pulse 72  Resp 25  SpO2 97%  Physical Exam  Nursing note and vitals reviewed. Constitutional: He appears well-developed and well-nourished. No distress. He is not intubated.  HENT:  Head: Normocephalic and atraumatic.  Right Ear: External ear normal.  Left Ear: External ear normal.  Mouth/Throat: Oropharynx is clear and moist. No oropharyngeal exudate.  Eyes: Conjunctivae normal are normal. Pupils are equal, round, and reactive to light. Right eye exhibits no discharge. Left eye exhibits no discharge. No scleral icterus.  Neck: Normal range of motion. No JVD present. No tracheal deviation present.  Cardiovascular: Normal rate, regular rhythm, normal heart sounds and intact distal pulses.  Exam reveals no gallop and no friction rub.   No murmur heard. Pulmonary/Chest: Effort normal. No accessory muscle usage or stridor. No apnea, not tachypneic and not bradypneic. He is not intubated. No respiratory distress. He has decreased breath sounds (diffuse and symmetric). He has no wheezes. He has no rhonchi. He has no rales. Chest wall is not dull to percussion. He exhibits tenderness. He exhibits no crepitus.    Abdominal: Soft. Bowel sounds are normal. He exhibits no distension, no ascites, no pulsatile midline mass and no mass. There is no hepatosplenomegaly. There is no  tenderness. There is no rebound, no guarding and no CVA tenderness. No hernia.  Musculoskeletal:       Left shoulder: He exhibits normal range of motion, no tenderness, no bony tenderness, no swelling, no crepitus, no deformity, no spasm and normal strength.       Lumbar back: He exhibits tenderness.       Arms:      Tenderness just lateral to the left scapula.  Shoulder is nontender.  No skin changes noted.  Discomfort wraps around to the anterior/lateral chest wall.  Lymphadenopathy:    He has no cervical adenopathy.  Skin: Skin is warm, dry and intact. No rash noted. He is not diaphoretic. No cyanosis. No pallor. Nails show no clubbing.    ED Course  Procedures (including critical care time)  Labs Reviewed - No data to display No results found.   No diagnosis found.    MDM  Pt presents for evaluation of left side and back pain.  The discomfort began immediately after lifting a carpet cleaner.  It is exacerbated by changes  in the position of his torso or the act of sitting upright.  He does have reproducible tenderness on exam.  He has also risk factors for heart disease and has been admitted and treated previously for a collapsed left lung.  Will screen for MI and ptx secondary to these risk factors.  His exam does point towards musculoskeletal pain.  1700.  Pt stable, NAD.  His pain is greatly improved but still reproduced with palpation just lateral to the left scapula.  He reports no worsening of his chronic shortness of breath.  Chest xray demonstrates no acute changes, PNA, PTX, effusion, edema.  He does have a hiatal hernia but clinically it appears to be asymptomatic at this time.  Repeat troponin is also negative.  Plan discharge home.  He will follow-up with his primary physician.      Tobin Chad, MD 02/17/12 [phone]

## 2012-02-17 NOTE — Progress Notes (Signed)
Pt states CV MD is Dr Sharyn Lull, Oncologist is Murinson, GI is Leone Payor but unable to recall who his pcp is He can only recall that the pcp is located on [address] in [place]

## 2012-02-17 NOTE — ED Notes (Signed)
Pt resting comfortably-denies pain when he is not moving.

## 2012-03-04 ENCOUNTER — Encounter: Payer: Self-pay | Admitting: Internal Medicine

## 2012-08-15 ENCOUNTER — Encounter: Payer: Self-pay | Admitting: Internal Medicine

## 2012-09-30 ENCOUNTER — Encounter: Payer: Medicare Other | Admitting: Internal Medicine

## 2012-10-12 ENCOUNTER — Ambulatory Visit (INDEPENDENT_AMBULATORY_CARE_PROVIDER_SITE_OTHER): Payer: Medicare Other | Admitting: Internal Medicine

## 2012-10-12 ENCOUNTER — Ambulatory Visit: Payer: Medicare Other | Admitting: Internal Medicine

## 2012-10-12 ENCOUNTER — Encounter: Payer: Self-pay | Admitting: Internal Medicine

## 2012-10-12 VITALS — BP 140/70 | HR 80 | Ht 70.5 in | Wt 182.2 lb

## 2012-10-12 DIAGNOSIS — R1314 Dysphagia, pharyngoesophageal phase: Secondary | ICD-10-CM

## 2012-10-12 DIAGNOSIS — K222 Esophageal obstruction: Secondary | ICD-10-CM

## 2012-10-12 DIAGNOSIS — K449 Diaphragmatic hernia without obstruction or gangrene: Secondary | ICD-10-CM

## 2012-10-12 DIAGNOSIS — Z1211 Encounter for screening for malignant neoplasm of colon: Secondary | ICD-10-CM

## 2012-10-12 DIAGNOSIS — K219 Gastro-esophageal reflux disease without esophagitis: Secondary | ICD-10-CM

## 2012-10-12 MED ORDER — LANSOPRAZOLE 30 MG PO CPDR: 30.0000 mg | DELAYED_RELEASE_CAPSULE | Freq: Every day | ORAL | Status: DC

## 2012-10-12 NOTE — Progress Notes (Signed)
  Subjective:    Patient ID: Jim Beck, male    DOB: 08/28/1932, 79 y.o.   MRN: [id number]  HPI The patient returns, having been seen last in 2012. At that time I dilated an esophageal stricture, started him on lansoprazole and his dysphagia resolved. Sometime since then he stopped taking his lansoprazole and is having increasing solid food dysphagia again. It is now I think a daily problem though he is trying to chew well and avoid problems. This also is an appropriate time for a repeat screening colonoscopy. Wt Readings from Last 3 Encounters:  10/12/12 182 lb 4 oz (82.668 kg)  02/10/11 176 lb (79.833 kg)  02/05/11 176 lb 6.4 oz (80.015 kg)  Medications, allergies, past medical history, past surgical history, family history and social history are reviewed and updated in the EMR.  Review of Systems As above    Objective:   Physical Exam General:  NAD Eyes:   Anicteric Mouth: dentures upper and lower but no lesions Lungs:  clear Heart:  S1S2 no rubs, murmurs or gallops Abdomen:  soft and nontender, BS+ Ext:   no edema    Data Reviewed: 2012 EGD, 2003 colonoscopy    Assessment & Plan:  Dysphagia, pharyngoesophageal phase  Esophageal stricture  Hiatal hernia, large  V76.51  1. Restart lansoprazole 30 mg daily 2. EGD and dilation of esophagus - The risks and benefits as well as alternatives of endoscopic procedure(s) have been discussed and reviewed. All questions answered. The patient agrees to proceed. 3.   Eventual screening colonoscopy

## 2012-10-12 NOTE — Patient Instructions (Addendum)
You have been scheduled for an endoscopy with propofol. Please follow written instructions given to you at your visit today. If you use inhalers (even only as needed), please bring them with you on the day of your procedure. Your physician has requested that you go to www.startemmi.com and enter the access code given to you at your visit today. This web site gives a general overview about your procedure. However, you should still follow specific instructions given to you by our office regarding your preparation for the procedure.  We have sent the following medications to your pharmacy for you to pick up at your convenience: Prevacid  May sure and chop up your food into small bites and chew it thoroughly.   I appreciate the opportunity to care for you. Marland Kitchen

## 2012-10-13 ENCOUNTER — Encounter: Payer: Self-pay | Admitting: Gastroenterology

## 2012-10-27 ENCOUNTER — Encounter: Payer: Medicare Other | Admitting: Internal Medicine

## 2013-01-10 ENCOUNTER — Other Ambulatory Visit: Payer: Self-pay | Admitting: Specialist

## 2013-01-13 ENCOUNTER — Ambulatory Visit
Admission: RE | Admit: 2013-01-13 | Discharge: 2013-01-13 | Disposition: A | Discharge status: Home / Self Care | Payer: Medicare Other | Source: Ambulatory Visit | Attending: Specialist | Admitting: Specialist

## 2013-01-13 MED ORDER — GADOBENATE DIMEGLUMINE 529 MG/ML IV SOLN
16.0000 mL | Freq: Once | INTRAVENOUS | Status: AC | PRN
  Administered 2013-01-13: 16 mL via INTRAVENOUS

## 2013-10-16 ENCOUNTER — Emergency Department (HOSPITAL_COMMUNITY)
Admission: EM | Admit: 2013-10-16 | Discharge: 2013-10-16 | Disposition: A | Discharge status: Home / Self Care | Payer: Medicare Other | Attending: Emergency Medicine | Admitting: Emergency Medicine

## 2013-10-16 ENCOUNTER — Emergency Department (HOSPITAL_COMMUNITY): Payer: Medicare Other

## 2013-10-16 ENCOUNTER — Encounter (HOSPITAL_COMMUNITY): Payer: Self-pay | Admitting: Emergency Medicine

## 2013-10-16 DIAGNOSIS — Z8711 Personal history of peptic ulcer disease: Secondary | ICD-10-CM | POA: Insufficient documentation

## 2013-10-16 DIAGNOSIS — Z8571 Personal history of Hodgkin lymphoma: Secondary | ICD-10-CM | POA: Insufficient documentation

## 2013-10-16 DIAGNOSIS — Z8739 Personal history of other diseases of the musculoskeletal system and connective tissue: Secondary | ICD-10-CM | POA: Insufficient documentation

## 2013-10-16 DIAGNOSIS — I10 Essential (primary) hypertension: Secondary | ICD-10-CM | POA: Insufficient documentation

## 2013-10-16 DIAGNOSIS — R011 Cardiac murmur, unspecified: Secondary | ICD-10-CM | POA: Insufficient documentation

## 2013-10-16 DIAGNOSIS — I252 Old myocardial infarction: Secondary | ICD-10-CM | POA: Insufficient documentation

## 2013-10-16 DIAGNOSIS — Z87448 Personal history of other diseases of urinary system: Secondary | ICD-10-CM | POA: Insufficient documentation

## 2013-10-16 DIAGNOSIS — R1031 Right lower quadrant pain: Secondary | ICD-10-CM | POA: Insufficient documentation

## 2013-10-16 DIAGNOSIS — R350 Frequency of micturition: Secondary | ICD-10-CM | POA: Insufficient documentation

## 2013-10-16 DIAGNOSIS — Z9889 Other specified postprocedural states: Secondary | ICD-10-CM | POA: Insufficient documentation

## 2013-10-16 DIAGNOSIS — E785 Hyperlipidemia, unspecified: Secondary | ICD-10-CM | POA: Insufficient documentation

## 2013-10-16 DIAGNOSIS — Z87891 Personal history of nicotine dependence: Secondary | ICD-10-CM | POA: Insufficient documentation

## 2013-10-16 DIAGNOSIS — K5900 Constipation, unspecified: Secondary | ICD-10-CM | POA: Insufficient documentation

## 2013-10-16 DIAGNOSIS — Z79899 Other long term (current) drug therapy: Secondary | ICD-10-CM | POA: Insufficient documentation

## 2013-10-16 DIAGNOSIS — Z862 Personal history of diseases of the blood and blood-forming organs and certain disorders involving the immune mechanism: Secondary | ICD-10-CM | POA: Insufficient documentation

## 2013-10-16 DIAGNOSIS — R6883 Chills (without fever): Secondary | ICD-10-CM | POA: Insufficient documentation

## 2013-10-16 DIAGNOSIS — R1084 Generalized abdominal pain: Secondary | ICD-10-CM | POA: Insufficient documentation

## 2013-10-16 LAB — URINALYSIS, ROUTINE W REFLEX MICROSCOPIC
BILIRUBIN URINE: NEGATIVE
Glucose, UA: NEGATIVE mg/dL
Hgb urine dipstick: NEGATIVE
Ketones, ur: NEGATIVE mg/dL
LEUKOCYTES UA: NEGATIVE
NITRITE: NEGATIVE
Protein, ur: 30 mg/dL — AB
SPECIFIC GRAVITY, URINE: 1.02 (ref 1.005–1.030)
Urobilinogen, UA: 1 mg/dL (ref 0.0–1.0)
pH: 6 (ref 5.0–8.0)

## 2013-10-16 LAB — COMPREHENSIVE METABOLIC PANEL
ALBUMIN: 3.7 g/dL (ref 3.5–5.2)
ALT: 24 U/L (ref 0–53)
AST: 23 U/L (ref 0–37)
Alkaline Phosphatase: 48 U/L (ref 39–117)
BILIRUBIN TOTAL: 0.5 mg/dL (ref 0.3–1.2)
BUN: 15 mg/dL (ref 6–23)
CHLORIDE: 103 meq/L (ref 96–112)
CO2: 27 mEq/L (ref 19–32)
CREATININE: 1.1 mg/dL (ref 0.50–1.35)
Calcium: 9.4 mg/dL (ref 8.4–10.5)
GFR calc Af Amer: 72 mL/min — ABNORMAL LOW (ref >90)
GFR calc non Af Amer: 62 mL/min — ABNORMAL LOW (ref >90)
Glucose, Bld: 129 mg/dL — ABNORMAL HIGH (ref 70–99)
POTASSIUM: 4.1 meq/L (ref 3.7–5.3)
Sodium: 143 mEq/L (ref 137–147)
TOTAL PROTEIN: 7.5 g/dL (ref 6.0–8.3)

## 2013-10-16 LAB — CBC WITH DIFFERENTIAL/PLATELET
BASOS ABS: 0 10*3/uL (ref 0.0–0.1)
BASOS PCT: 0 % (ref 0–1)
Eosinophils Absolute: 0 10*3/uL (ref 0.0–0.7)
Eosinophils Relative: 0 % (ref 0–5)
HCT: 39.5 % (ref 39.0–52.0)
Hemoglobin: 12.6 g/dL — ABNORMAL LOW (ref 13.0–17.0)
Lymphocytes Relative: 12 % (ref 12–46)
Lymphs Abs: 1.7 10*3/uL (ref 0.7–4.0)
MCH: 28.2 pg (ref 26.0–34.0)
MCHC: 31.9 g/dL (ref 30.0–36.0)
MCV: 88.4 fL (ref 78.0–100.0)
MONO ABS: 1.1 10*3/uL — AB (ref 0.1–1.0)
Monocytes Relative: 8 % (ref 3–12)
NEUTROS ABS: 11.6 10*3/uL — AB (ref 1.7–7.7)
NEUTROS PCT: 80 % — AB (ref 43–77)
Platelets: 159 10*3/uL (ref 150–400)
RBC: 4.47 MIL/uL (ref 4.22–5.81)
RDW: 16.7 % — AB (ref 11.5–15.5)
WBC: 14.4 10*3/uL — ABNORMAL HIGH (ref 4.0–10.5)

## 2013-10-16 LAB — LIPASE, BLOOD: Lipase: 20 U/L (ref 11–59)

## 2013-10-16 LAB — URINE MICROSCOPIC-ADD ON

## 2013-10-16 LAB — LACTIC ACID, PLASMA: LACTIC ACID, VENOUS: 1.4 mmol/L (ref 0.5–2.2)

## 2013-10-16 LAB — TROPONIN I: Troponin I: <0.3 ng/mL (ref <0.30)

## 2013-10-16 MED ORDER — IOHEXOL 300 MG/ML  SOLN
50.0000 mL | Freq: Once | INTRAMUSCULAR | Status: AC | PRN
  Administered 2013-10-16: 50 mL via ORAL

## 2013-10-16 MED ORDER — MORPHINE SULFATE 2 MG/ML IJ SOLN
2.0000 mg | INTRAMUSCULAR | Status: DC | PRN
  Filled 2013-10-16: qty 1

## 2013-10-16 MED ORDER — IOHEXOL 300 MG/ML  SOLN
100.0000 mL | Freq: Once | INTRAMUSCULAR | Status: AC | PRN
  Administered 2013-10-16: 100 mL via INTRAVENOUS

## 2013-10-16 NOTE — ED Notes (Addendum)
Patient has a urinal at bedside- patient knows we need urine

## 2013-10-16 NOTE — Discharge Instructions (Signed)

## 2013-10-16 NOTE — ED Notes (Signed)
Pt c/o right lower side abd pan that started last night, pt denies n/v/d. Last BM on last week on Wednesday.

## 2013-10-16 NOTE — ED Provider Notes (Signed)
CSN: [id number]     Arrival date & time 10/16/13  0539 History   First MD Initiated Contact with Patient 10/16/13 1124     Chief Complaint  Patient presents with  . Abdominal Pain     (Consider location/radiation/quality/duration/timing/severity/associated sxs/prior Treatment) Patient is a 80 y.o. male presenting with abdominal pain.  Abdominal Pain Pain location:  RLQ Pain quality: aching and sharp   Pain radiates to:  Does not radiate Pain severity:  Moderate Onset quality:  Gradual Duration:  2 days Timing:  Constant Progression:  Improving Chronicity:  New Context comment:  Started yesterday after playing bingo Relieved by:  Nothing Worsened by:  Palpation Associated symptoms: chills ( and sweats) and constipation (chronic)   Associated symptoms: no chest pain, no diarrhea, no dysuria (urinary frequency which is chronic), no fever, no shortness of breath and no vomiting     Past Medical History  Diagnosis Date  . COPD (chronic obstructive pulmonary disease)   . Tobacco abuse   . Hypertension   . Hyperlipemia   . Hodgkin lymphoma 07/2009    head and neck  . GERD (gastroesophageal reflux disease)     large hiatal hernia and stricture  . Hx of blood diseases     bullous blood disease  . PUD (peptic ulcer disease)   . BPH (benign prostatic hypertrophy)   . History of alcohol abuse   . Heart murmur   . Diverticulosis   . External hemorrhoids   . Arthritis   . Myocardial infarction   . Substance abuse    Past Surgical History  Procedure Laterality Date  . Inguinal hernia repair      bilateral  . Upper gastrointestinal endoscopy  03/08/2002    esophageal stricture, hiatal hernia  . Colonoscopy  03/08/2002    diverticulosis, external hemorrhoids  . Lung surgery  2011   Family History  Problem Relation Age of Onset  . Cancer Brother     abdominal  . Lung cancer Brother   . Throat cancer Sister    History  Substance Use Topics  . Smoking status: Former  Research scientist (life sciences)  . Smokeless tobacco: Never Used  . Alcohol Use: No    Review of Systems  Constitutional: Positive for chills ( and sweats). Negative for fever.  Respiratory: Negative for shortness of breath.   Cardiovascular: Negative for chest pain.  Gastrointestinal: Positive for abdominal pain and constipation (chronic). Negative for vomiting and diarrhea.  Genitourinary: Negative for dysuria (urinary frequency which is chronic).  All other systems reviewed and are negative.     Allergies  Aspirin  Home Medications   Prior to Admission medications   Medication Sig Start Date End Date Taking? Authorizing Provider  amLODipine (NORVASC) 5 MG tablet Take 5 mg by mouth daily.   Yes Historical Provider, MD  ferrous sulfate 325 (65 FE) MG tablet Take 325 mg by mouth 3 (three) times daily with meals.   Yes Historical Provider, MD  gabapentin (NEURONTIN) 100 MG capsule Take 100 mg by mouth 2 (two) times a week.   Yes Historical Provider, MD  rosuvastatin (CRESTOR) 10 MG tablet Take 10 mg by mouth every evening.    Yes Historical Provider, MD   BP 94/70  Pulse 96  Temp(Src) 98.5 F (36.9 C) (Oral)  Resp 18  SpO2 92% Physical Exam  Nursing note and vitals reviewed. Constitutional: He is oriented to person, place, and time. He appears well-developed and well-nourished. No distress.  HENT:  Head: Normocephalic and atraumatic.  Mouth/Throat: Oropharynx is clear and moist.  Eyes: Conjunctivae are normal. Pupils are equal, round, and reactive to light. No scleral icterus.  Neck: Neck supple.  Cardiovascular: Normal rate, regular rhythm, normal heart sounds and intact distal pulses.   No murmur heard. Pulmonary/Chest: Effort normal and breath sounds normal. No stridor. No respiratory distress. He has no wheezes. He has no rales.  Abdominal: Soft. He exhibits no distension. There is generalized tenderness (worse in RLQ).  Genitourinary: Penis normal. Right testis shows no mass and no  tenderness. Left testis shows no mass and no tenderness.  Musculoskeletal: Normal range of motion. He exhibits no edema.  Neurological: He is alert and oriented to person, place, and time.  Skin: Skin is warm and dry. No rash noted.  Psychiatric: He has a normal mood and affect. His behavior is normal.    ED Course  Procedures (including critical care time) Labs Review Labs Reviewed  CBC WITH DIFFERENTIAL - Abnormal; Notable for the following:    WBC 14.4 (*)    Hemoglobin 12.6 (*)    RDW 16.7 (*)    Neutrophils Relative % 80 (*)    Neutro Abs 11.6 (*)    Monocytes Absolute 1.1 (*)    All other components within normal limits  COMPREHENSIVE METABOLIC PANEL - Abnormal; Notable for the following:    Glucose, Bld 129 (*)    GFR calc non Af Amer 62 (*)    GFR calc Af Amer 72 (*)    All other components within normal limits  URINALYSIS, ROUTINE W REFLEX MICROSCOPIC - Abnormal; Notable for the following:    Protein, ur 30 (*)    All other components within normal limits  LIPASE, BLOOD  LACTIC ACID, PLASMA  TROPONIN I  URINE MICROSCOPIC-ADD ON    Imaging Review Ct Abdomen Pelvis W Contrast  10/16/2013   CLINICAL DATA:  Right lower quadrant abdominal pain.  EXAM: CT ABDOMEN AND PELVIS WITH CONTRAST  TECHNIQUE: Multidetector CT imaging of the abdomen and pelvis was performed using the standard protocol following bolus administration of intravenous contrast.  CONTRAST:  70mL OMNIPAQUE IOHEXOL 300 MG/ML SOLN, 164mL OMNIPAQUE IOHEXOL 300 MG/ML SOLN  COMPARISON:  06/26/2010.  FINDINGS: Lung Bases: Emphysema is evident. There is a large hiatal hernia containing most of the stomach.  Liver:  No focal abnormality.  Spleen: Normal  Stomach: Large hiatal hernia as mentioned above.  Pancreas: Normal.  No dilatation of the main duct.  Gallbladder/Biliary Tree: No evidence for gallstones. No intra or extrahepatic biliary duct dilatation.  Kidneys/Adrenals: No adrenal nodule or mass. Left kidney is  normal. Several small cysts are seen in the right kidney, as before.  Bowel Loops: Duodenum is normal. No evidence for small bowel obstruction. Terminal ileum is normal. The appendix is normal. Scattered diverticular changes seen in the colon without diverticulitis.  Nodes: No pelvic sidewall lymphadenopathy. There is no retroperitoneal or intraperitoneal lymphadenopathy in the abdomen  Vasculature: Atherosclerotic calcification is noted in the wall of the abdominal aorta without aneurysm.  Pelvic Genitourinary: Posterior lateral right bladder diverticulum noted, as before. Prostate gland is unremarkable.  Bones/Musculoskeletal: Bone windows reveal no worrisome lytic or sclerotic osseous lesions.  Body Wall: Bilateral inguinal hernias are evident, right greater than left. Small soft tissue attenuating structure in the right inguinal hernia is unchanged in the interval. There is no edema or inflammation in either hernia sac to suggest fatty incarceration.  Other: No intraperitoneal free fluid.  IMPRESSION: No acute findings in the abdomen  or pelvis. Specifically, no findings to explain the patient's history of right lower sign pain. The appendix and the terminal ileum are normal.  Bilateral inguinal hernias, right greater than left, contain only fat. These are unchanged in appearance since 06/26/2010.  Large hiatal hernia containing nearly all of the stomach. This is stable since the prior study.   Electronically Signed   By: Misty Stanley M.D.   On: 10/16/2013 13:57  All radiology studies independently viewed by me.      EKG Interpretation   Date/Time:  Monday October 16 2013 11:53:17 EDT Ventricular Rate:  78 PR Interval:  180 QRS Duration: 138 QT Interval:  420 QTC Calculation: 478 R Axis:   28 Text Interpretation:  Sinus rhythm Right bundle branch block compared to  last tracing in 2013, RBBB is new Confirmed by [facility]  MD, TREY ([id number]) on  10/16/2013 12:08:45 PM      MDM   Final diagnoses:   Abdominal pain, RLQ (right lower quadrant)    80 year old male presenting with right lower quadrant abdominal pain. Pain severe last night, improved today. Lab work unremarkable except for mild leukocytosis. CT negative for acute process. Given his improving symptoms and negative CT, he appears stable for discharge home. Advised close PCP followup and have given return precautions.    [name] III, MD 10/16/13 [phone]

## 2013-11-17 ENCOUNTER — Telehealth: Payer: Self-pay | Admitting: Internal Medicine

## 2013-11-17 ENCOUNTER — Encounter: Payer: Self-pay | Admitting: Internal Medicine

## 2013-11-17 NOTE — Telephone Encounter (Signed)
Patient with dysphagia has had esophageal stricture dilation in the past.  Is it ok to set up for direct procedure rather than OV?

## 2013-11-17 NOTE — Telephone Encounter (Signed)
Direct ok 

## 2013-11-17 NOTE — Telephone Encounter (Signed)
Left message for patient to call back  

## 2013-11-20 ENCOUNTER — Ambulatory Visit (AMBULATORY_SURGERY_CENTER): Payer: Self-pay | Admitting: *Deleted

## 2013-11-20 VITALS — Ht 71.0 in | Wt 186.0 lb

## 2013-11-20 DIAGNOSIS — Z1211 Encounter for screening for malignant neoplasm of colon: Secondary | ICD-10-CM

## 2013-11-20 DIAGNOSIS — R1310 Dysphagia, unspecified: Secondary | ICD-10-CM

## 2013-11-20 MED ORDER — NA SULFATE-K SULFATE-MG SULF 17.5-3.13-1.6 GM/177ML PO SOLN: 1.0000 | Freq: Once | ORAL | Status: DC

## 2013-11-20 NOTE — Progress Notes (Signed)
Denies allergies to eggs or soy products. Denies complications with sedation or anesthesia. Denies O2 use. Denies use of diet or weight loss medications.  Emmi instructions given for colonoscopy.  

## 2013-11-21 NOTE — Telephone Encounter (Signed)
Patient has been scheduled for 11/30/13 and has had a pre-visit

## 2013-11-30 ENCOUNTER — Encounter: Payer: Self-pay | Admitting: Internal Medicine

## 2013-11-30 ENCOUNTER — Ambulatory Visit (AMBULATORY_SURGERY_CENTER): Payer: Medicare Other | Admitting: Internal Medicine

## 2013-11-30 VITALS — BP 107/78 | HR 65 | Temp 97.0°F | Resp 32 | Ht 71.0 in | Wt 186.0 lb

## 2013-11-30 DIAGNOSIS — K222 Esophageal obstruction: Secondary | ICD-10-CM

## 2013-11-30 DIAGNOSIS — Z860100 Personal history of colon polyps, unspecified: Secondary | ICD-10-CM

## 2013-11-30 DIAGNOSIS — Z1211 Encounter for screening for malignant neoplasm of colon: Secondary | ICD-10-CM

## 2013-11-30 DIAGNOSIS — D126 Benign neoplasm of colon, unspecified: Secondary | ICD-10-CM

## 2013-11-30 DIAGNOSIS — R1310 Dysphagia, unspecified: Secondary | ICD-10-CM

## 2013-11-30 HISTORY — DX: Personal history of colon polyps, unspecified: Z86.0100

## 2013-11-30 HISTORY — DX: Personal history of colonic polyps: Z86.010

## 2013-11-30 MED ORDER — PANTOPRAZOLE SODIUM 40 MG PO TBEC: 40.0000 mg | DELAYED_RELEASE_TABLET | Freq: Every day | ORAL | Status: DC

## 2013-11-30 MED ORDER — SODIUM CHLORIDE 0.9 % IV SOLN: 500.0000 mL | INTRAVENOUS | Status: DC

## 2013-11-30 NOTE — Progress Notes (Signed)
Pt stable to RR 

## 2013-11-30 NOTE — Op Note (Signed)
[place]  [address] [place], [id number]   ENDOSCOPY PROCEDURE REPORT  PATIENT: Jim Beck, Jim Beck  MR#: [id number] BIRTHDATE: [birth year] , 14  yrs. old GENDER: Male ENDOSCOPIST: Gatha Mayer, MD, [facility] PROCEDURE DATE:  11/30/2013 PROCEDURE:  Esophagoscopy  and balloon dilation < 30 ASA CLASS:     Class III INDICATIONS:  Dysphagia.   Therapeutic procedure. MEDICATIONS: propofol (Diprivan) 100mg  IV, MAC sedation, administered by CRNA, and These medications were titrated to patient response per physician's verbal order TOPICAL ANESTHETIC: none  DESCRIPTION OF PROCEDURE: After the risks benefits and alternatives of the procedure were thoroughly explained, informed consent was obtained.  The LB [id number] endoscope was introduced through the mouth and advanced to the stomach antrum. Without limitations.  The instrument was slowly withdrawn as the mucosa was fully examined.        ESOPHAGUS: A stricture was found at the gastroesophageal junction. The stenosis was traversable with the endoscope.   It was inflamed and dilated to 18 mm with 15, 16.5 and 18 mm BS balloon.  STOMACH: A 10 cm hiatal hernia was noted.  The remainder of the upper endoscopy exam was otherwise normal. Retroflexed views revealed a hiatal hernia.     The scope was then withdrawn from the patient and the procedure completed.  COMPLICATIONS: There were no complications. ENDOSCOPIC IMPRESSION: 1.   Stricture was found at the gastroesophageal junction and dilated to 18 mm 2.   10 cm hiatal hernia 3.   The remainder of the upper endoscopy exam was otherwise normal  RECOMMENDATIONS: 1.  Clear liquids until , then soft foods rest iof day.  Resume prior diet tomorrow. 2.  PPI qam - pantoprazole 40 mg - take forever to reduce chance of repeat stricture 3.   Office visit in 2 months 4.   colonoscopy next  eSigned:  Gatha Mayer, MD, [facility] 11/30/2013 10:03 AM   CC:The Patient   and Velna Hatchet, MD

## 2013-11-30 NOTE — Patient Instructions (Addendum)
I stretched the esophagus so you can swallow better.  You need to take medicine to stop acid reflux and keep the esophagus open. You should take this every day no matter what.  I found and removed 2 polyps - they do not look like cancer.  You need to see me in the office in 2 months.  I will let you know pathology results and when to have another routine colonoscopy by mail.  I appreciate the opportunity to care for you. Gatha Mayer, MD, [facility]    FOLLOW DISCHARGE INSTRUCTIONS [facility] AND GREEN SHEETS).YOU HAD AN ENDOSCOPIC PROCEDURE TODAY AT [facility]: Refer to the procedure report that was given to you for any specific questions about what was found during the examination.  If the procedure report does not answer your questions, please call your gastroenterologist to clarify.  If you requested that your care partner not be given the details of your procedure findings, then the procedure report has been included in a sealed envelope for you to review at your convenience later.  YOU SHOULD EXPECT: Some feelings of bloating in the abdomen. Passage of more gas than usual.  Walking can help get rid of the air that was put into your GI tract during the procedure and reduce the bloating. If you had a lower endoscopy (such as a colonoscopy or flexible sigmoidoscopy) you may notice spotting of blood in your stool or on the toilet paper. If you underwent a bowel prep for your procedure, then you may not have a normal bowel movement for a few days.  DIET: Your first meal following the procedure should be a light meal and then it is ok to progress to your normal diet.  A half-sandwich or bowl of soup is an example of a good first meal.  Heavy or fried foods are harder to digest and may make you feel nauseous or bloated.  Likewise meals heavy in dairy and vegetables can cause extra gas to form and this can also increase the bloating.  Drink plenty of fluids but you should avoid alcoholic  beverages for 24 hours.  ACTIVITY: Your care partner should take you home directly after the procedure.  You should plan to take it easy, moving slowly for the rest of the day.  You can resume normal activity the day after the procedure however you should NOT DRIVE or use heavy machinery for 24 hours (because of the sedation medicines used during the test).    SYMPTOMS TO REPORT IMMEDIATELY: A gastroenterologist can be reached at any hour.  During normal business hours, 8:30 AM to 5:00 PM Monday through Friday, call [phone].  After hours and on weekends, please call the GI answering service at [phone] who will take a message and have the physician on call contact you.   Following lower endoscopy (colonoscopy or flexible sigmoidoscopy):  Excessive amounts of blood in the stool  Significant tenderness or worsening of abdominal pains  Swelling of the abdomen that is new, acute  Fever of 100F or higher  Following upper endoscopy (EGD)  Vomiting of blood or coffee ground material  New chest pain or pain under the shoulder blades  Painful or persistently difficult swallowing  New shortness of breath  Fever of 100F or higher  Black, tarry-looking stools  FOLLOW UP: If any biopsies were taken you will be contacted by phone or by letter within the next 1-3 weeks.  Call your gastroenterologist if you have not heard about  the biopsies in 3 weeks.  Our staff will call the home number listed on your records the next business day following your procedure to check on you and address any questions or concerns that you may have at that time regarding the information given to you following your procedure. This is a courtesy call and so if there is no answer at the home number and we have not heard from you through the emergency physician on call, we will assume that you have returned to your regular daily activities without incident.  SIGNATURES/CONFIDENTIALITY: You and/or your care partner  have signed paperwork which will be entered into your electronic medical record.  These signatures attest to the fact that that the information above on your After Visit Summary has been reviewed and is understood.  Full responsibility of the confidentiality of this discharge information lies with you and/or your care-partner.  No aspirin or nonsteroidal anti-inflammatory medication for next two weeks. Stricture information and hiatal hernia information given.  Clear liquids until 11 AM , soft foods remainder of day.  After dilation diet given.   Pantoprazole 40mg  daily as directed.   Polyp information given.

## 2013-11-30 NOTE — Op Note (Signed)
[facility]  [address] [place], [address]   COLONOSCOPY PROCEDURE REPORT  PATIENT: Jim Beck, Jim Beck  MR#: [id number] BIRTHDATE: 1932-12-18 , 80  yrs. old GENDER: Male ENDOSCOPIST: Gatha Mayer, MD, [facility] PROCEDURE DATE:  11/30/2013 PROCEDURE:   Colonoscopy with snare polypectomy First Screening Colonoscopy - Avg.  risk and is 50 yrs.  old or older - No.  Prior Negative Screening - Now for repeat screening. 10 or more years since last screening  History of Adenoma - Now for follow-up colonoscopy & has been > or = to 3 yrs.  N/A  Polyps Removed Today? Yes. ASA CLASS:   Class III INDICATIONS:average risk screening. MEDICATIONS: propofol (Diprivan) 150mg  IV, MAC sedation, administered by CRNA, and These medications were titrated to patient response per physician's verbal order  DESCRIPTION OF PROCEDURE:   After the risks benefits and alternatives of the procedure were thoroughly explained, informed consent was obtained.  A digital rectal exam revealed no rectal mass and A digital rectal exam revealed no prostatic nodules.   The LB [id number]  endoscope was introduced through the anus and advanced to the cecum, which was identified by both the appendix and ileocecal valve. No adverse events experienced.   The quality of the prep was Suprep good  The instrument was then slowly withdrawn as the colon was fully examined.  COLON FINDINGS: Two sessile polyps measuring 12 and 3 mm in size were found in the transverse colon and ascending colon.  A polypectomy was performed using snare cautery and with a cold snare.  The resection was complete and the polyp tissue was completely retrieved.   The colon mucosa was otherwise normal. Retroflexed views revealed no abnormalities. The time to cecum=4 minutes 30 seconds.  Withdrawal time=10 minutes 52 seconds.  The scope was withdrawn and the procedure completed. COMPLICATIONS: There were no complications.  ENDOSCOPIC  IMPRESSION: 1.   Two sessile polyps measuring 12 and 3 mm in size were found in the transverse colon and ascending colon; polypectomy was performed using snare cautery and with a cold snare 2.   The colon mucosa was otherwise normal - good prep  RECOMMENDATIONS: 1.  Timing of repeat colonoscopy will be determined by pathology findings. 2.   At his age 4 not need routine repeat colonoscopy   eSigned:  Gatha Mayer, MD, [facility] 11/30/2013 10:07 AM   cc: The Patient and Velna Hatchet, MD

## 2013-11-30 NOTE — Progress Notes (Signed)
Called to room to assist during endoscopic procedure.  Patient ID and intended procedure confirmed with present staff. Received instructions for my participation in the procedure from the performing physician.  

## 2013-12-01 ENCOUNTER — Telehealth: Payer: Self-pay | Admitting: *Deleted

## 2013-12-01 NOTE — Telephone Encounter (Signed)
Left message that we called for f/u 

## 2013-12-07 ENCOUNTER — Encounter: Payer: Self-pay | Admitting: Internal Medicine

## 2013-12-07 NOTE — Progress Notes (Signed)
Quick Note:  12 mm and diminutive adenoma No routine repeat colon (age) ______

## 2014-01-23 ENCOUNTER — Ambulatory Visit: Payer: Medicare Other | Admitting: Internal Medicine

## 2014-07-04 ENCOUNTER — Telehealth: Payer: Self-pay | Admitting: Oncology

## 2014-07-04 NOTE — Telephone Encounter (Signed)
Faxed pt medical records to [facility] [phone]

## 2014-07-11 ENCOUNTER — Telehealth: Payer: Self-pay | Admitting: Internal Medicine

## 2014-07-11 NOTE — Telephone Encounter (Signed)
called pt made and confirmed appt.    July 26, 2014 Mohamed 11 AM  Dx: Follow up non hodgkins lymphoma Referring: Dr. Delfina Redwood

## 2014-07-12 ENCOUNTER — Telehealth: Payer: Self-pay | Admitting: Internal Medicine

## 2014-07-12 NOTE — Telephone Encounter (Signed)
Chart made on 07/12/14.

## 2014-07-26 ENCOUNTER — Encounter: Payer: Self-pay | Admitting: Internal Medicine

## 2014-07-26 ENCOUNTER — Other Ambulatory Visit (HOSPITAL_BASED_OUTPATIENT_CLINIC_OR_DEPARTMENT_OTHER): Payer: Medicare Other

## 2014-07-26 ENCOUNTER — Ambulatory Visit (HOSPITAL_BASED_OUTPATIENT_CLINIC_OR_DEPARTMENT_OTHER): Payer: Medicare Other | Admitting: Internal Medicine

## 2014-07-26 ENCOUNTER — Other Ambulatory Visit: Payer: Self-pay | Admitting: Internal Medicine

## 2014-07-26 ENCOUNTER — Telehealth: Payer: Self-pay | Admitting: Internal Medicine

## 2014-07-26 ENCOUNTER — Ambulatory Visit: Payer: Self-pay

## 2014-07-26 VITALS — BP 165/95 | HR 70 | Temp 98.5°F | Resp 19 | Ht 71.0 in | Wt 183.3 lb

## 2014-07-26 DIAGNOSIS — M79602 Pain in left arm: Secondary | ICD-10-CM | POA: Diagnosis not present

## 2014-07-26 DIAGNOSIS — C8590 Non-Hodgkin lymphoma, unspecified, unspecified site: Secondary | ICD-10-CM

## 2014-07-26 DIAGNOSIS — M542 Cervicalgia: Secondary | ICD-10-CM | POA: Diagnosis not present

## 2014-07-26 LAB — CBC WITH DIFFERENTIAL/PLATELET
BASO%: 0.2 % (ref 0.0–2.0)
Basophils Absolute: 0 10*3/uL (ref 0.0–0.1)
EOS ABS: 0 10*3/uL (ref 0.0–0.5)
EOS%: 0.9 % (ref 0.0–7.0)
HCT: 34.7 % — ABNORMAL LOW (ref 38.4–49.9)
HGB: 10.5 g/dL — ABNORMAL LOW (ref 13.0–17.1)
LYMPH%: 48.1 % (ref 14.0–49.0)
MCH: 24.6 pg — ABNORMAL LOW (ref 27.2–33.4)
MCHC: 30.3 g/dL — AB (ref 32.0–36.0)
MCV: 81.5 fL (ref 79.3–98.0)
MONO#: 0.5 10*3/uL (ref 0.1–0.9)
MONO%: 11.8 % (ref 0.0–14.0)
NEUT#: 1.7 10*3/uL (ref 1.5–6.5)
NEUT%: 39 % (ref 39.0–75.0)
Platelets: 203 10*3/uL (ref 140–400)
RBC: 4.26 10*6/uL (ref 4.20–5.82)
RDW: 19.5 % — AB (ref 11.0–14.6)
WBC: 4.4 10*3/uL (ref 4.0–10.3)
lymph#: 2.1 10*3/uL (ref 0.9–3.3)
nRBC: 0 % (ref 0–0)

## 2014-07-26 LAB — LACTATE DEHYDROGENASE (CC13): LDH: 212 U/L (ref 125–245)

## 2014-07-26 LAB — COMPREHENSIVE METABOLIC PANEL (CC13)
ALK PHOS: 45 U/L (ref 40–150)
ALT: 12 U/L (ref 0–55)
AST: 14 U/L (ref 5–34)
Albumin: 3.6 g/dL (ref 3.5–5.0)
Anion Gap: 9 mEq/L (ref 3–11)
BUN: 16.6 mg/dL (ref 7.0–26.0)
CO2: 25 mEq/L (ref 22–29)
CREATININE: 1 mg/dL (ref 0.7–1.3)
Calcium: 9.2 mg/dL (ref 8.4–10.4)
Chloride: 109 mEq/L (ref 98–109)
EGFR: 84 mL/min/{1.73_m2} — AB (ref >90)
Glucose: 93 mg/dl (ref 70–140)
Potassium: 4.3 mEq/L (ref 3.5–5.1)
Sodium: 143 mEq/L (ref 136–145)
Total Bilirubin: 0.38 mg/dL (ref 0.20–1.20)
Total Protein: 6.9 g/dL (ref 6.4–8.3)

## 2014-07-26 LAB — TECHNOLOGIST REVIEW

## 2014-07-26 NOTE — Progress Notes (Signed)
Checked in new patient with no issues prior to seeing the dr. He has not traveled. °

## 2014-07-26 NOTE — Progress Notes (Signed)
[place] [phone]   [phone]  CONSULT NOTE  REFERRING PHYSICIAN: Dr. Jori Moll polite  REASON FOR CONSULTATION:  81 years old African-American male with history of Hodgkin's lymphoma  HPI Jim Beck is a 81 y.o. male with past medical history significant for multiple medical problems including history of COPD, hypertension, dyslipidemia, GERD, peptic ulcer disease, diverticulosis, myocardial infarction as well as history of Hodgkin's lymphoma of the neck diagnosed in 1979 status post radiation for 4,000 cGY but no systemic chemotherapy. He also has a history of recurrent disease in the left axilla and neck area diagnosed in April 2011 and was managed at that time by Dr. Ralene Ok. The patient is not sure if she received any systemic treatment for his condition. He was followed by observation. He was lost to follow-up after Dr. Ralene Ok retired. He was seen recently by his primary care physician Dr. polite for routine physical examination and complained of pain in the left neck area of 2-3 years duration. The patient was referred to me today for evaluation and recommendation regarding his condition. When seen today he continues to complain of pain on the left neck as well as the left axilla. The patient has no significant weight loss but has night sweats. He denied having any significant nausea or vomiting but has occasional left-sided chest pain with shortness breath with exertion as well as mild cough. Family history significant for 2 brothers and 3 sisters with cancer. The patient is married and has 2 children. He is currently retired. He has a history of smoking but quit 30 years ago. He has no history of alcohol or drug abuse. HPI  Past Medical History  Diagnosis Date  . COPD (chronic obstructive pulmonary disease)   . Tobacco abuse   . Hypertension   . Hyperlipemia   . Hodgkin lymphoma 07/2009    head and neck  . GERD (gastroesophageal reflux  disease)     large hiatal hernia and stricture  . Hx of blood diseases     bullous blood disease  . PUD (peptic ulcer disease)   . BPH (benign prostatic hypertrophy)   . History of alcohol abuse   . Heart murmur   . Diverticulosis   . External hemorrhoids   . Arthritis   . Myocardial infarction   . Substance abuse   . Personal history of colonic polyps - adenomas 11/30/2013    12 mm and diminutive adenoma - no recall planned (age)    Past Surgical History  Procedure Laterality Date  . Inguinal hernia repair      bilateral  . Upper gastrointestinal endoscopy  03/08/2002    esophageal stricture, hiatal hernia  . Colonoscopy  03/08/2002    diverticulosis, external hemorrhoids  . Lung surgery  2011    Family History  Problem Relation Age of Onset  . Cancer Brother     abdominal  . Lung cancer Brother   . Throat cancer Sister   . Colon cancer Neg Hx   . Esophageal cancer Neg Hx   . Rectal cancer Neg Hx   . Stomach cancer Neg Hx     Social History History  Substance Use Topics  . Smoking status: Former Research scientist (life sciences)  . Smokeless tobacco: Never Used  . Alcohol Use: No    Allergies  Allergen Reactions  . Aspirin     shakes    Current Outpatient Prescriptions  Medication Sig Dispense Refill  . amLODipine (NORVASC) 5 MG tablet Take 5  mg by mouth daily.    . rosuvastatin (CRESTOR) 10 MG tablet Take 10 mg by mouth every evening.     . ferrous sulfate 325 (65 FE) MG tablet Take 1 tablet by mouth daily.     No current facility-administered medications for this visit.    Review of Systems  Constitutional: positive for fatigue and night sweats Eyes: negative Ears, nose, mouth, throat, and face: negative Respiratory: positive for cough, dyspnea on exertion and pleurisy/chest pain Cardiovascular: negative Gastrointestinal: negative Genitourinary:negative Integument/breast: negative Hematologic/lymphatic: negative Musculoskeletal:negative Neurological:  negative Behavioral/Psych: negative Endocrine: negative Allergic/Immunologic: negative  Physical Exam  ZES:PQZRA, healthy, no distress, well nourished and well developed SKIN: skin color, texture, turgor are normal, no rashes or significant lesions HEAD: Normocephalic, No masses, lesions, tenderness or abnormalities EYES: normal, PERRLA, Conjunctiva are pink and non-injected EARS: External ears normal, Canals clear OROPHARYNX:no exudate, no erythema and lips, buccal mucosa, and tongue normal  NECK: supple, no adenopathy, no JVD LYMPH:  no palpable lymphadenopathy, no hepatosplenomegaly LUNGS: clear to auscultation , and palpation HEART: regular rate & rhythm, no murmurs and no gallops ABDOMEN:abdomen soft, non-tender, normal bowel sounds and no masses or organomegaly BACK: Back symmetric, no curvature., No CVA tenderness EXTREMITIES:no joint deformities, effusion, or inflammation, no edema, no skin discoloration  NEURO: alert & oriented x 3 with fluent speech, no focal motor/sensory deficits  PERFORMANCE STATUS: ECOG 1  LABORATORY DATA: Lab Results  Component Value Date   WBC 4.4 07/26/2014   HGB 10.5* 07/26/2014   HCT 34.7* 07/26/2014   MCV 81.5 07/26/2014   PLT 203 07/26/2014      Chemistry      Component Value Date/Time   NA 143 07/26/2014 1102   NA 143 10/16/2013 1151   K 4.3 07/26/2014 1102   K 4.1 10/16/2013 1151   CL 103 10/16/2013 1151   CO2 25 07/26/2014 1102   CO2 27 10/16/2013 1151   BUN 16.6 07/26/2014 1102   BUN 15 10/16/2013 1151   CREATININE 1.0 07/26/2014 1102   CREATININE 1.10 10/16/2013 1151      Component Value Date/Time   CALCIUM 9.2 07/26/2014 1102   CALCIUM 9.4 10/16/2013 1151   ALKPHOS 45 07/26/2014 1102   ALKPHOS 48 10/16/2013 1151   AST 14 07/26/2014 1102   AST 23 10/16/2013 1151   ALT 12 07/26/2014 1102   ALT 24 10/16/2013 1151   BILITOT 0.38 07/26/2014 1102   BILITOT 0.5 10/16/2013 1151       RADIOGRAPHIC STUDIES: No results  found.  ASSESSMENT: This is a very pleasant 81 years old African-American male with history of Hodgkin's lymphoma diagnosed in 1979 status post curative radiotherapy at that time. He has evidence for disease recurrence in April 2011 but I am not sure if the patient received any treatment for his condition under the care of Dr. Ralene Ok. Has been observation for several years and now complaining of left neck and left axilla pain.   PLAN: I had a lengthy discussion with the patient today about his condition. I recommended for him to have repeat CT scan of the neck and chest for evaluation of his condition. He has CT scan of the abdomen performed several months ago that showed no evidence of lymphoma in the abdomen. If the patient has evidence for disease recurrence I will discuss with him treatment options including systemic chemotherapy. The patient was advised to call immediately if he has any concerning symptoms in the interval.  The patient voices understanding of current  disease status and treatment options and is in agreement with the current care plan.  All questions were answered. The patient knows to call the clinic with any problems, questions or concerns. We can certainly see the patient much sooner if necessary.  Thank you so much for allowing me to participate in the care of Jim Beck. I will continue to follow up the patient with you and assist in his care.  I spent 40 minutes counseling the patient face to face. The total time spent in the appointment was 60 minutes.  Disclaimer: This note was dictated with voice recognition software. Similar sounding words can inadvertently be transcribed and may not be corrected upon review.   Nira Visscher K. July 26, 2014, 12:26 PM

## 2014-07-26 NOTE — Telephone Encounter (Signed)
Gave avs & calendar for April. °

## 2014-08-16 ENCOUNTER — Ambulatory Visit (HOSPITAL_COMMUNITY): Payer: Medicare Other

## 2014-08-17 ENCOUNTER — Telehealth: Payer: Self-pay | Admitting: *Deleted

## 2014-08-17 ENCOUNTER — Telehealth: Payer: Self-pay | Admitting: Physician Assistant

## 2014-08-17 NOTE — Telephone Encounter (Signed)
Called patient and he is aware of his new appointment time

## 2014-08-17 NOTE — Telephone Encounter (Signed)
Called pt regarding CT scan r/s for 5/2  Pt advised he thought his CT scan was today 4/22. Discussed with pt appt for 4/25  MD visit is cancelled due to not having CT scan results. Informed pt he will receive a call from scheduling regarding new MD appt. Pt advised he had written down new CT scan appt date/time. Will wait to hear from scheduling for new appt with MD. He had trouble remembering this weeks appointment as he has been taking his wife around town for her eyes and her sickness.  He has asked we leave the information on his machine or keep calling back if he does not answer. No further cocnerns. POF completed.

## 2014-08-17 NOTE — Telephone Encounter (Signed)
Pt no show CT scan 4/21. R/S scan 5/2, pt will f/u with provider to discuss scan. Called pt to inform, unable to reach, unable to leave message- no voice mail.

## 2014-08-20 ENCOUNTER — Ambulatory Visit: Payer: Medicare Other | Admitting: Internal Medicine

## 2014-08-27 ENCOUNTER — Ambulatory Visit (HOSPITAL_COMMUNITY)
Admission: RE | Admit: 2014-08-27 | Discharge: 2014-08-27 | Disposition: A | Discharge status: Home / Self Care | Payer: Medicare Other | Source: Ambulatory Visit | Attending: Internal Medicine | Admitting: Internal Medicine

## 2014-08-27 ENCOUNTER — Encounter (HOSPITAL_COMMUNITY): Payer: Self-pay

## 2014-08-27 DIAGNOSIS — Z8572 Personal history of non-Hodgkin lymphomas: Secondary | ICD-10-CM | POA: Diagnosis not present

## 2014-08-27 DIAGNOSIS — M542 Cervicalgia: Secondary | ICD-10-CM | POA: Insufficient documentation

## 2014-08-27 DIAGNOSIS — R911 Solitary pulmonary nodule: Secondary | ICD-10-CM | POA: Diagnosis not present

## 2014-08-27 DIAGNOSIS — C8590 Non-Hodgkin lymphoma, unspecified, unspecified site: Secondary | ICD-10-CM

## 2014-08-27 MED ORDER — IOHEXOL 300 MG/ML  SOLN
100.0000 mL | Freq: Once | INTRAMUSCULAR | Status: AC | PRN
  Administered 2014-08-27: 100 mL via INTRAVENOUS

## 2014-08-30 ENCOUNTER — Telehealth: Payer: Self-pay | Admitting: *Deleted

## 2014-08-30 NOTE — Telephone Encounter (Signed)
PT.'S APPOINTMENT WAS CONFIRMED FOR Monday,09/03/14, AT 2:15PM. HE VOICES UNDERSTANDING.

## 2014-09-03 ENCOUNTER — Ambulatory Visit (HOSPITAL_BASED_OUTPATIENT_CLINIC_OR_DEPARTMENT_OTHER): Payer: Medicare Other | Admitting: Physician Assistant

## 2014-09-03 ENCOUNTER — Telehealth: Payer: Self-pay | Admitting: Physician Assistant

## 2014-09-03 ENCOUNTER — Encounter: Payer: Self-pay | Admitting: Physician Assistant

## 2014-09-03 VITALS — BP 129/82 | HR 89 | Temp 98.0°F | Resp 20 | Ht 71.0 in | Wt 190.0 lb

## 2014-09-03 DIAGNOSIS — C8590 Non-Hodgkin lymphoma, unspecified, unspecified site: Secondary | ICD-10-CM

## 2014-09-03 NOTE — Progress Notes (Addendum)
Hematology and Oncology Follow Up Visit  Jim Beck [id number] July 02, 1932 81 y.o. 09/03/2014 5:43 PM  Principle Diagnosis: Hodgkin's lymphoma, initially diagnosed in 1979,recurrence in April 2011  Prior Therapy: Status post radiation 4 4000 CT Y without systemic chemotherapy  Current therapy: None  Interim History:  Jim Beck presents for a scheduled follow-up appointment regarding his history of Hodgkin's lymphoma involving the neck and left axilla. He is been complaining of pain in the left side of his neck as well as left axilla and recently had a CT of the neck and chest with contrast to reevaluate his complaints. He presents today to discuss the results. He voices no specific complaints today but when further query does admit to having night sweats. He states the night sweats have been stable for quite some time.  Medications: I have reviewed the patient's current medications.  Allergies:  Allergies  Allergen Reactions  . Aspirin     shakes    Past Medical History, Surgical history, Social history, and Family History were reviewed and updated.  Review of Systems: Review of Systems  Constitutional: Negative for fever, chills, weight loss, malaise/fatigue and diaphoresis.       Night Sweats  HENT: Negative for congestion, ear discharge, ear pain, hearing loss, nosebleeds, sore throat and tinnitus.   Eyes: Negative for blurred vision, double vision, photophobia, pain, discharge and redness.  Respiratory: Negative for cough, hemoptysis, sputum production, shortness of breath, wheezing and stridor.   Cardiovascular: Negative for chest pain, palpitations, orthopnea, claudication, leg swelling and PND.  Gastrointestinal: Negative for heartburn, nausea, vomiting, abdominal pain, diarrhea, constipation, blood in stool and melena.  Genitourinary: Negative.   Musculoskeletal: Positive for myalgias.       Pain in the left neck area and left axilla  Skin: Negative.   Neurological: Negative  for dizziness, tingling, focal weakness, seizures, weakness and headaches.  Endo/Heme/Allergies: Does not bruise/bleed easily.  Psychiatric/Behavioral: Negative for depression. The patient is not nervous/anxious and does not have insomnia.     Remaining ROS negative.  Physical Exam: Blood pressure 129/82, pulse 89, temperature 98 F (36.7 C), temperature source Oral, resp. rate 20, height 5\' 11"  (1.803 m), weight 190 lb (86.183 kg), SpO2 100 %. ECOG:  Physical Exam  Constitutional: He is oriented to person, place, and time and well-developed, well-nourished, and in no distress.  HENT:  Head: Normocephalic and atraumatic.  Mouth/Throat: Oropharynx is clear and moist.  Eyes: Pupils are equal, round, and reactive to light.  Neck: Normal range of motion. Neck supple. No JVD present. No tracheal deviation present. No thyromegaly present.  No palpable lymphadenopathy  Cardiovascular: Normal rate, regular rhythm, normal heart sounds and intact distal pulses.  Exam reveals no gallop and no friction rub.   No murmur heard. Pulmonary/Chest: Effort normal and breath sounds normal. No respiratory distress. He has no wheezes. He has no rales.  Abdominal: Soft. Bowel sounds are normal. He exhibits no distension and no mass. There is no tenderness.  Musculoskeletal: Normal range of motion. He exhibits no edema or tenderness.  Lymphadenopathy:    He has no cervical adenopathy.    He has no axillary adenopathy.  Neurological: He is alert and oriented to person, place, and time. He has normal reflexes. Gait normal.  Skin: Skin is warm and dry. No rash noted.      Lab Results: Lab Results  Component Value Date   WBC 4.4 07/26/2014   HGB 10.5* 07/26/2014   HCT 34.7* 07/26/2014   MCV 81.5  07/26/2014   PLT 203 07/26/2014     Chemistry      Component Value Date/Time   NA 143 07/26/2014 1102   NA 143 10/16/2013 1151   K 4.3 07/26/2014 1102   K 4.1 10/16/2013 1151   CL 103 10/16/2013 1151    CO2 25 07/26/2014 1102   CO2 27 10/16/2013 1151   BUN 16.6 07/26/2014 1102   BUN 15 10/16/2013 1151   CREATININE 1.0 07/26/2014 1102   CREATININE 1.10 10/16/2013 1151      Component Value Date/Time   CALCIUM 9.2 07/26/2014 1102   CALCIUM 9.4 10/16/2013 1151   ALKPHOS 45 07/26/2014 1102   ALKPHOS 48 10/16/2013 1151   AST 14 07/26/2014 1102   AST 23 10/16/2013 1151   ALT 12 07/26/2014 1102   ALT 24 10/16/2013 1151   BILITOT 0.38 07/26/2014 1102   BILITOT 0.5 10/16/2013 1151       Radiological Studies: Ct Soft Tissue Neck W Contrast  08/27/2014   CLINICAL DATA:  Personal history of Hodgkin's lymphoma of the neck diagnosed 1979 status post radiation therapy. Recurrent disease in the left axilla and neck 07/2009. Patient currently experiencing left neck and left axillary pain. Night sweats.  EXAM: CT NECK WITH CONTRAST  TECHNIQUE: Multidetector CT imaging of the neck was performed using the standard protocol following the bolus administration of intravenous contrast.  CONTRAST:  121mL OMNIPAQUE IOHEXOL 300 MG/ML  SOLN  COMPARISON:  PET-CT 12/03/2009  FINDINGS: Pharynx and larynx: The nasopharynx is unremarkable. Mild asymmetry of the soft tissues of the right lateral oropharynx compared to the left is similar to the prior PET-CT without a discrete mass identified. Larynx is unremarkable.  Salivary glands: The left submandibular gland is slightly smaller than the right, possibly related to prior radiation therapy. 5 mm soft tissue nodule in the posterior right parotid gland is unchanged from the prior PET-CT and likely represents a normal intraparotid lymph node. Parotid glands are otherwise unremarkable.  Thyroid: Incidental 5 mm right thyroid lobe nodule.  Lymph nodes: No enlarged lymph nodes are identified in the neck.  Vascular: Moderate right and extensive left carotid bifurcation atherosclerotic calcification is noted without evidence of high-grade common carotid or internal carotid artery  stenosis. Major vascular structures of the neck appear patent.  Limited intracranial: The visualized portion of the brain is unremarkable.  Visualized orbits: Unremarkable.  Mastoids and visualized paranasal sinuses: Minimal bilateral maxillary sinus mucosal thickening.  Skeleton: There is severe, asymmetric right-sided disc space narrowing at C5-6, with moderate narrowing on the right at C4-5. Asymmetric severe right facet arthrosis is present at C6-7. Right neural foraminal stenosis is moderate at C4-5 and severe at C5-6 and C6-7.  Upper chest: Evaluated on separate dedicated chest CT.  IMPRESSION: 1. No enlarged lymph nodes in the neck suggest recurrent disease. 2. Asymmetry of the soft tissues of the oropharynx without a discrete mass, similar to the prior PET-CT and possibly related to prior radiation therapy. Consider direct visualization. 3. Moderate to severe mid to lower cervical disc degeneration with right-sided foraminal stenosis as above.   Electronically Signed   By: Logan Bores   On: 08/27/2014 14:39   Ct Chest W Contrast  08/27/2014   CLINICAL DATA:  History of lymphoma diagnosed in 2011. Left axilla and left lateral neck pain for 1 year.  EXAM: CT CHEST WITH CONTRAST  TECHNIQUE: Multidetector CT imaging of the chest was performed during intravenous contrast administration.  CONTRAST:  148mL OMNIPAQUE IOHEXOL 300 MG/ML  SOLN  COMPARISON:  Chest CT 06/26/2010  FINDINGS: Chest wall: There is bulky left axillary and subpectoral lymphadenopathy. The largest left axillary lymph node measures 32.5 x 24.5 mm on image 11.  The largest subpectoral lymph node measures 20 x 15.5 mm on image number 12 no supraclavicular adenopathy and no right sided axillary adenopathy. Small scattered lymph nodes are noted.  The thyroid gland appears normal. The bony thorax is intact. No destructive bone lesions or spinal canal compromise. Remote healed rib fractures are noted.  Mediastinum: Enlarged prevascular lymph nodes  on image number 21 measure 29 x 11 mm and 24 x 9.5 mm on image 21. No pretracheal or subcarinal adenopathy. No hilar adenopathy. New line the heart is normal in size. No pericardial effusion. The aorta is normal in caliber. No dissection. Large hiatal hernia.  Lungs/pleura stable surgical changes involving the left lung apex. Severe emphysematous changes and pulmonary scarring. There is a stable smoothly marginated well circumscribed 7 mm nodule in the right upper lobe on image number 25. No new pulmonary lesions. No acute pulmonary findings. No pleural effusion.  Upper abdomen: No significant findings. No upper abdominal lymphadenopathy.  IMPRESSION: 1. Enlarged left axillary and subpectoral lymph nodes consistent with recurrent lymphoma. 2. Borderline enlarged but relatively stable prevascular lymph nodes. No pretracheal or subcarinal adenopathy and no hilar adenopathy. 3. Severe emphysematous changes and pulmonary scarring. 4. Stable 7 mm right upper lobe pulmonary nodule.   Electronically Signed   By: Marijo Sanes M.D.   On: 08/27/2014 14:35    Impression and Plan: The patient is a pleasant 81 year old African-American male with a history of Hodgkin's lymphoma initially diagnosed in 1979 status post radiation 4 4000 CT Y without systemic chemotherapy. He had a history of recurrent disease in the left axilla and neck area diagnosed in April 2011, specific treatment unclear. Patient was discussed with and also seen by Dr. Julien Nordmann. The CT scan results were discussed with the patient which revealed enlarging left axillary and subpectoral lymph nodes consistent with recurrent lymphoma. There was also borderline enlarged but relatively stable prevascular lymph node without any pretracheal or subcarinal adenopathy and no hilar adenopathy. There is a stable 7 mm right upper lobe pulmonary nodule as well as severe emphysematous changes and pulmonary scarring. We will refer the patient to general surgeon for removal  and biopsy of the left axillary lymph node. He will return in approximately one month to discuss the results as well as treatment options.  Spent more than half the time coordinating care.    Carlton Adam, [place] 5/9/20165:43 PM  ADDENDUM: Hematology/Oncology Attending: I had a face to face encounter with the patient. I recommended his care plan. This is a very pleasant 81 years old African-American male with history of Hodgkin lymphoma diagnosed in 1979 status post radiotherapy at that time and has been on observation for the last 40 years or so. He has been complaining recently of increasing lymphadenopathy in the neck and axillary area. Repeat CT scan of the neck and chest showed enlarging left axillary and subpectoral lymph nodes consistent with recurrent lymphoma in addition to borderline enlarged but relatively stable prevascular lymph nodes. I discussed the scan results with the patient today. I recommended for him to see surgery for consideration of excisional biopsy of the left axillary lymph node for confirmation of tissue diagnosis. I will see him back for follow-up visit in one month's for reevaluation and discussion of his biopsy results as well as treatment options.  The patient was advised to call immediately if he has any concerning symptoms in the interval.  Disclaimer: This note was dictated with voice recognition software. Similar sounding words can inadvertently be transcribed and may be missed upon review. Eilleen Kempf., MD 09/04/2014

## 2014-09-03 NOTE — Telephone Encounter (Signed)
per pof to sch pt appt-sent email to Tiffany in [facility] to send medical records to [facility] for lymph node removal -adv pt that office will call to sch

## 2014-09-04 ENCOUNTER — Telehealth: Payer: Self-pay | Admitting: Internal Medicine

## 2014-09-04 NOTE — Patient Instructions (Signed)
You're being referred to a general surgeon to his the left axillary lymph node removed and biopsied Follow up in one month to discuss the results and treatment options.

## 2014-09-04 NOTE — Telephone Encounter (Signed)
Faxed pt medical records to [facility]. °

## 2014-09-17 ENCOUNTER — Other Ambulatory Visit: Payer: Self-pay | Admitting: Surgery

## 2014-09-21 ENCOUNTER — Encounter (HOSPITAL_COMMUNITY)
Admission: RE | Admit: 2014-09-21 | Discharge: 2014-09-21 | Disposition: A | Discharge status: Home / Self Care | Payer: Medicare Other | Source: Ambulatory Visit | Attending: Surgery | Admitting: Surgery

## 2014-09-21 ENCOUNTER — Encounter (HOSPITAL_COMMUNITY): Payer: Self-pay

## 2014-09-21 DIAGNOSIS — F1910 Other psychoactive substance abuse, uncomplicated: Secondary | ICD-10-CM | POA: Diagnosis not present

## 2014-09-21 DIAGNOSIS — Z8572 Personal history of non-Hodgkin lymphomas: Secondary | ICD-10-CM | POA: Diagnosis not present

## 2014-09-21 DIAGNOSIS — I252 Old myocardial infarction: Secondary | ICD-10-CM | POA: Diagnosis not present

## 2014-09-21 DIAGNOSIS — Z8711 Personal history of peptic ulcer disease: Secondary | ICD-10-CM | POA: Diagnosis not present

## 2014-09-21 DIAGNOSIS — K449 Diaphragmatic hernia without obstruction or gangrene: Secondary | ICD-10-CM | POA: Diagnosis not present

## 2014-09-21 DIAGNOSIS — E780 Pure hypercholesterolemia: Secondary | ICD-10-CM | POA: Diagnosis not present

## 2014-09-21 DIAGNOSIS — R591 Generalized enlarged lymph nodes: Secondary | ICD-10-CM | POA: Diagnosis present

## 2014-09-21 DIAGNOSIS — F17210 Nicotine dependence, cigarettes, uncomplicated: Secondary | ICD-10-CM | POA: Diagnosis not present

## 2014-09-21 DIAGNOSIS — K219 Gastro-esophageal reflux disease without esophagitis: Secondary | ICD-10-CM | POA: Diagnosis not present

## 2014-09-21 DIAGNOSIS — I10 Essential (primary) hypertension: Secondary | ICD-10-CM | POA: Diagnosis not present

## 2014-09-21 DIAGNOSIS — C8594 Non-Hodgkin lymphoma, unspecified, lymph nodes of axilla and upper limb: Secondary | ICD-10-CM | POA: Diagnosis not present

## 2014-09-21 DIAGNOSIS — F329 Major depressive disorder, single episode, unspecified: Secondary | ICD-10-CM | POA: Diagnosis not present

## 2014-09-21 HISTORY — DX: Depression, unspecified: F32.A

## 2014-09-21 HISTORY — DX: Constipation, unspecified: K59.00

## 2014-09-21 HISTORY — DX: Personal history of other diseases of the digestive system: Z87.19

## 2014-09-21 HISTORY — DX: Major depressive disorder, single episode, unspecified: F32.9

## 2014-09-21 HISTORY — DX: Reserved for inherently not codable concepts without codable children: IMO0001

## 2014-09-21 LAB — CBC
HCT: 33.3 % — ABNORMAL LOW (ref 39.0–52.0)
HEMOGLOBIN: 10 g/dL — AB (ref 13.0–17.0)
MCH: 23.6 pg — ABNORMAL LOW (ref 26.0–34.0)
MCHC: 30 g/dL (ref 30.0–36.0)
MCV: 78.7 fL (ref 78.0–100.0)
Platelets: 196 10*3/uL (ref 150–400)
RBC: 4.23 MIL/uL (ref 4.22–5.81)
RDW: 20 % — ABNORMAL HIGH (ref 11.5–15.5)
WBC: 5.8 10*3/uL (ref 4.0–10.5)

## 2014-09-21 LAB — BASIC METABOLIC PANEL
Anion gap: 7 (ref 5–15)
BUN: 17 mg/dL (ref 6–20)
CO2: 26 mmol/L (ref 22–32)
Calcium: 9.1 mg/dL (ref 8.9–10.3)
Chloride: 105 mmol/L (ref 101–111)
Creatinine, Ser: 1.1 mg/dL (ref 0.61–1.24)
GFR calc Af Amer: >60 mL/min (ref >60)
GFR calc non Af Amer: >60 mL/min (ref >60)
Glucose, Bld: 88 mg/dL (ref 65–99)
Potassium: 4.4 mmol/L (ref 3.5–5.1)
Sodium: 138 mmol/L (ref 135–145)

## 2014-09-21 NOTE — Progress Notes (Signed)
   09/21/14 1456  OBSTRUCTIVE SLEEP APNEA  Have you ever been diagnosed with sleep apnea through a sleep study? No  Do you snore loudly (loud enough to be heard through closed doors)?  0  Do you often feel tired, fatigued, or sleepy during the daytime? 1  Has anyone observed you stop breathing during your sleep? 1  Do you have, or are you being treated for high blood pressure? 1  BMI more than 35 kg/m2? 0  Age over 79 years old? 1  Neck circumference greater than 40 cm/16 inches? 0  Gender: 1

## 2014-09-21 NOTE — Pre-Procedure Instructions (Signed)
    Januel Masser  09/21/2014        Your procedure is scheduled on 09/26/14.  Report to [facility] short stay admitting at 900 A.M.   Call this number if you have problems the morning of surgery:  [phone]              For any other questions, please call [phone], Monday - Friday 8 AM - 4 PM.   Remember:  Do not eat food or drink liquids after midnight.  Take these medicines the morning of surgery with A SIP OF WATER amlodipine      STOP all herbel meds, nsaids (aleve,naproxen,advil,ibuprofen) starting today including aspirin, vitamins   Do not wear jewelry, make-up or nail polish.  Do not wear lotions, powders, or perfumes.  You may wear deodorant.  Do not shave 48 hours prior to surgery.  Men may shave face and neck.  Do not bring valuables to the hospital.  [facility] is not responsible for any belongings or valuables.  Contacts, dentures or bridgework may not be worn into surgery.  Leave your suitcase in the car.  After surgery it may be brought to your room.  For patients admitted to the hospital, discharge time will be determined by your treatment team.  Patients discharged the day of surgery will not be allowed to drive home.     Please read over the following fact sheets that you were given. Pain Booklet, Coughing and Deep Breathing and Surgical Site Infection Prevention

## 2014-09-21 NOTE — Pre-Procedure Instructions (Signed)
Delshon Audia  09/21/2014      [id number] [place] [address] Phone: [phone] Fax: [phone]  [facility] [address] [place], [place] [address] [address] Phone: [phone] Fax: [phone]    Your procedure is scheduled on 09/26/14.  Report to [facility] short stay admitting at 900 A.M.  Call this number if you have problems the morning of surgery:  [phone]   Remember:  Do not eat food or drink liquids after midnight.  Take these medicines the morning of surgery with A SIP OF WATER amlodipine      STOP all herbel meds, nsaids (aleve,naproxen,advil,ibuprofen) starting today including aspirin, vitamins   Do not wear jewelry, make-up or nail polish.  Do not wear lotions, powders, or perfumes.  You may wear deodorant.  Do not shave 48 hours prior to surgery.  Men may shave face and neck.  Do not bring valuables to the hospital.  [facility] is not responsible for any belongings or valuables.  Contacts, dentures or bridgework may not be worn into surgery.  Leave your suitcase in the car.  After surgery it may be brought to your room.  For patients admitted to the hospital, discharge time will be determined by your treatment team.  Patients discharged the day of surgery will not be allowed to drive home.   Name and phone number of your driver:    Special instructions:   Special Instructions: [place] - Preparing for Surgery  Before surgery, you can play an important role.  Because skin is not sterile, your skin needs to be as free of germs as possible.  You can reduce the number of germs on you skin by washing with CHG (chlorahexidine gluconate) soap before surgery.  CHG is an antiseptic cleaner which kills germs and bonds with the skin to continue killing germs even after washing.  Please DO NOT use if you have an  allergy to CHG or antibacterial soaps.  If your skin becomes reddened/irritated stop using the CHG and inform your nurse when you arrive at Short Stay.  Do not shave (including legs and underarms) for at least 48 hours prior to the first CHG shower.  You may shave your face.  Please follow these instructions carefully:   1.  Shower with CHG Soap the night before surgery and the morning of Surgery.  2.  If you choose to wash your hair, wash your hair first as usual with your normal shampoo.  3.  After you shampoo, rinse your hair and body thoroughly to remove the Shampoo.  4.  Use CHG as you would any other liquid soap.  You can apply chg directly  to the skin and wash gently with scrungie or a clean washcloth.  5.  Apply the CHG Soap to your body ONLY FROM THE NECK DOWN.  Do not use on open wounds or open sores.  Avoid contact with your eyes ears, mouth and genitals (private parts).  Wash genitals (private parts)       with your normal soap.  6.  Wash thoroughly, paying special attention to the area where your surgery will be performed.  7.  Thoroughly rinse your body with warm water from the neck down.  8.  DO NOT shower/wash with your normal soap after using and rinsing off the CHG Soap.  9.  Pat yourself dry with  a clean towel.            10.  Wear clean pajamas.            11.  Place clean sheets on your bed the night of your first shower and do not sleep with pets.  Day of Surgery  Do not apply any lotions/deodorants the morning of surgery.  Please wear clean clothes to the hospital/surgery center.  Please read over the following fact sheets that you were given. Pain Booklet, Coughing and Deep Breathing and Surgical Site Infection Prevention

## 2014-09-24 NOTE — Progress Notes (Addendum)
Anesthesia Chart Review:  Pt is 81 year old male scheduled for excisional biopsy L axillary lymph node on 09/26/2014 with Dr. Ninfa Linden.   Cardiologist is Dr. Terrence Dupont, last office visit 09/14/14  PMH includes: MI, HTN, heart murmur, hyperlipidemia, COPD, hx alcohol abuse, hx substance abuse, Hodgkin lymphoma. Former smoker. BMI 26.   Preoperative labs reviewed.  H/H 10.0/33.3.   EKG 09/21/2014: NSR. RBBB.   Cardiac cath 07/23/2009: -LV showed good LV systolic function, EF 16-10% -LM and L CX were patent -LAD had 15-20% proximal stenosis. Diagonal 1 was very very small, diagonal 2 to diagonal 4 were small and patent.  -OM1 and OM2 were very very small. OM3 was moderate sized and patent.  OM4 was small and patent.  -RCA had 10-15% proximal stenosis -PDA had 20-30% proximal stenosis -PLV branch was patent -No AAA, bilateral renal arteries patent  Nuclear stress test 05/20/2009:  1. Inferior wall infarct. No evidence of myocardial ischemia. 2. Mild inferior wall hypokinesis at the base of the heart. Wall motion otherwise normal. 3. Estimated Q G S ejection fraction 53%.  Dr. Zenia Resides note dated 09/14/14 indicates he is aware of pt's upcoming surgery.   If no changes, I anticipate pt can proceed with surgery as scheduled.   Willeen Cass, FNP-BC [facility] Short Stay Surgical Center/Anesthesiology Phone: [phone] 09/25/2014 2:39 PM

## 2014-09-25 MED ORDER — CEFAZOLIN SODIUM-DEXTROSE 2-3 GM-% IV SOLR
2.0000 g | INTRAVENOUS | Status: AC
  Administered 2014-09-26: 2 g via INTRAVENOUS
  Filled 2014-09-25: qty 50

## 2014-09-25 NOTE — H&P (Signed)
  Jim Beck 09/17/2014 2:54 PM Location: [facility] Patient #: [id number] DOB: [birth year] Married / Language: English / Race: Black or African American Male  History of Present Illness (Koral Thaden A. Ninfa Linden MD; 09/17/2014 2:58 PM) Patient words: axillary nodes.  The patient is a 81 year old male who presents with lymphadenopathy. This is a pleasant gentleman referred by Dr. Julien Nordmann for possible recurrent lymphoma. He has a previous history of lymphoma and a recent scan was found to have increasing adenopathy in his chest and left axilla. Excisional biopsy of a left axillary lymph nodes been requested. He is otherwise without complaints.   Other Problems Marjean Donna, CMA; 09/17/2014 2:54 PM) Cancer High blood pressure Hypercholesterolemia  Past Surgical History Marjean Donna, CMA; 09/17/2014 2:54 PM) Lung Surgery Left.  Allergies Davy Pique Bynum, CMA; 09/17/2014 2:55 PM) No Known Drug Allergies05/23/2016  Medication History Davy Pique Bynum, CMA; 09/17/2014 2:55 PM) Crestor (10MG  Tablet, Oral) Active. AmLODIPine Besylate (2.5MG  Tablet, Oral) Active. Medications Reconciled  Social History Marjean Donna, CMA; 09/17/2014 2:54 PM) Caffeine use Coffee. Tobacco use Former smoker.  Family History Marjean Donna, [place]; 09/17/2014 2:54 PM) Alcohol Abuse Father. Cancer Brother, Sister. Diabetes Mellitus Sister, Son.  Review of Systems ([place]; 09/17/2014 2:54 PM) HEENT Present- Ringing in the Ears. Not Present- Earache, Hearing Loss, Hoarseness, Nose Bleed, Oral Ulcers, Seasonal Allergies, Sinus Pain, Sore Throat, Visual Disturbances, Wears glasses/contact lenses and Yellow Eyes. Respiratory Present- Snoring. Not Present- Bloody sputum, Chronic Cough, Difficulty Breathing and Wheezing. Cardiovascular Present- Difficulty Breathing Lying Down. Not Present- Chest Pain, Leg Cramps, Palpitations, Rapid Heart Rate, Shortness of Breath and Swelling of  Extremities. Gastrointestinal Present- Excessive gas. Not Present- Abdominal Pain, Bloating, Bloody Stool, Change in Bowel Habits, Chronic diarrhea, Constipation, Difficulty Swallowing, Gets full quickly at meals, Hemorrhoids, Indigestion, Nausea, Rectal Pain and Vomiting. Musculoskeletal Present- Back Pain. Not Present- Joint Pain, Joint Stiffness, Muscle Pain, Muscle Weakness and Swelling of Extremities. Neurological Present- Headaches. Not Present- Decreased Memory, Fainting, Numbness, Seizures, Tingling, Tremor, Trouble walking and Weakness.   Vitals (Sonya Bynum CMA; 09/17/2014 2:54 PM) 09/17/2014 2:54 PM Weight: 185 lb Height: 69in Body Surface Area: 2.02 m Body Mass Index: 27.32 kg/m Temp.: 45F(Temporal)  Pulse: 79 (Regular)  BP: 130/80 (Sitting, Left Arm, Standard)    Physical Exam (Tyrisha Benninger A. Ninfa Linden MD; 09/17/2014 2:59 PM) The physical exam findings are as follows: Note:General he is well appearance Lungs are clear bilaterally with normal respiratory effort Cardiovascular is regular rate and rhythm with a positive murmur There are several large, mobile lymph nodes in the left axilla. There is no arm swelling Skin shows no rashes and no jaundice    Assessment & Plan (Brett Soza A. Ninfa Linden MD; 09/17/2014 2:59 PM) LYMPHADENOPATHY, AXILLARY (785.6  R59.0) Impression: I discussed this with the patient in detail. He has had lymph node biopsies in the past. I discussed the risk which includes but is not limited to bleeding, infection, seroma formation, need for further surgery, injury to surrounding structures, etc. He understands and wished to proceed with surgery.     Signed by Harl Bowie, MD (09/17/2014 3:02 PM)

## 2014-09-26 ENCOUNTER — Ambulatory Visit (HOSPITAL_COMMUNITY)
Admission: RE | Admit: 2014-09-26 | Discharge: 2014-09-26 | Disposition: A | Discharge status: Home / Self Care | Payer: Medicare Other | Source: Ambulatory Visit | Attending: Surgery | Admitting: Surgery

## 2014-09-26 ENCOUNTER — Encounter (HOSPITAL_COMMUNITY): Payer: Self-pay

## 2014-09-26 ENCOUNTER — Ambulatory Visit (HOSPITAL_COMMUNITY): Payer: Medicare Other | Admitting: Vascular Surgery

## 2014-09-26 ENCOUNTER — Ambulatory Visit (HOSPITAL_COMMUNITY): Payer: Medicare Other | Admitting: Certified Registered Nurse Anesthetist

## 2014-09-26 ENCOUNTER — Encounter (HOSPITAL_COMMUNITY)
Admission: RE | Disposition: A | Discharge status: Home / Self Care | Payer: Self-pay | Source: Ambulatory Visit | Attending: Surgery

## 2014-09-26 DIAGNOSIS — Z8711 Personal history of peptic ulcer disease: Secondary | ICD-10-CM | POA: Insufficient documentation

## 2014-09-26 DIAGNOSIS — F329 Major depressive disorder, single episode, unspecified: Secondary | ICD-10-CM | POA: Diagnosis not present

## 2014-09-26 DIAGNOSIS — C8594 Non-Hodgkin lymphoma, unspecified, lymph nodes of axilla and upper limb: Secondary | ICD-10-CM | POA: Insufficient documentation

## 2014-09-26 DIAGNOSIS — F17210 Nicotine dependence, cigarettes, uncomplicated: Secondary | ICD-10-CM | POA: Insufficient documentation

## 2014-09-26 DIAGNOSIS — K219 Gastro-esophageal reflux disease without esophagitis: Secondary | ICD-10-CM | POA: Insufficient documentation

## 2014-09-26 DIAGNOSIS — F1910 Other psychoactive substance abuse, uncomplicated: Secondary | ICD-10-CM | POA: Insufficient documentation

## 2014-09-26 DIAGNOSIS — E780 Pure hypercholesterolemia: Secondary | ICD-10-CM | POA: Insufficient documentation

## 2014-09-26 DIAGNOSIS — Z8572 Personal history of non-Hodgkin lymphomas: Secondary | ICD-10-CM | POA: Insufficient documentation

## 2014-09-26 DIAGNOSIS — K449 Diaphragmatic hernia without obstruction or gangrene: Secondary | ICD-10-CM | POA: Diagnosis not present

## 2014-09-26 DIAGNOSIS — I252 Old myocardial infarction: Secondary | ICD-10-CM | POA: Insufficient documentation

## 2014-09-26 DIAGNOSIS — I10 Essential (primary) hypertension: Secondary | ICD-10-CM | POA: Diagnosis not present

## 2014-09-26 DIAGNOSIS — Z419 Encounter for procedure for purposes other than remedying health state, unspecified: Secondary | ICD-10-CM

## 2014-09-26 HISTORY — PX: LYMPH NODE BIOPSY: SHX201

## 2014-09-26 SURGERY — LYMPH NODE BIOPSY
Anesthesia: General | Site: Axilla | Laterality: Left

## 2014-09-26 MED ORDER — EPHEDRINE SULFATE 50 MG/ML IJ SOLN
INTRAMUSCULAR | Status: DC | PRN
  Administered 2014-09-26: 10 mg via INTRAVENOUS

## 2014-09-26 MED ORDER — PROPOFOL 10 MG/ML IV BOLUS
INTRAVENOUS | Status: DC | PRN
  Administered 2014-09-26: 150 mg via INTRAVENOUS
  Administered 2014-09-26: 50 mg via INTRAVENOUS

## 2014-09-26 MED ORDER — ONDANSETRON HCL 4 MG/2ML IJ SOLN
INTRAMUSCULAR | Status: DC | PRN
  Administered 2014-09-26: 4 mg via INTRAVENOUS

## 2014-09-26 MED ORDER — BUPIVACAINE-EPINEPHRINE (PF) 0.5% -1:200000 IJ SOLN
INTRAMUSCULAR | Status: AC
  Filled 2014-09-26: qty 30

## 2014-09-26 MED ORDER — FENTANYL CITRATE (PF) 100 MCG/2ML IJ SOLN
INTRAMUSCULAR | Status: DC | PRN
  Administered 2014-09-26 (×2): 50 ug via INTRAVENOUS

## 2014-09-26 MED ORDER — HYDROCODONE-ACETAMINOPHEN 5-325 MG PO TABS
1.0000 | ORAL_TABLET | ORAL | Status: DC | PRN
Start: 2014-09-26 — End: 2015-04-10

## 2014-09-26 MED ORDER — LIDOCAINE HCL (CARDIAC) 20 MG/ML IV SOLN
INTRAVENOUS | Status: DC | PRN
  Administered 2014-09-26: 50 mg via INTRAVENOUS

## 2014-09-26 MED ORDER — HYDROMORPHONE HCL 1 MG/ML IJ SOLN: 0.5000 mg | INTRAMUSCULAR | Status: DC | PRN

## 2014-09-26 MED ORDER — FENTANYL CITRATE (PF) 250 MCG/5ML IJ SOLN
INTRAMUSCULAR | Status: AC
  Filled 2014-09-26: qty 5

## 2014-09-26 MED ORDER — 0.9 % SODIUM CHLORIDE (POUR BTL) OPTIME
TOPICAL | Status: DC | PRN
  Administered 2014-09-26: 1000 mL

## 2014-09-26 MED ORDER — ONDANSETRON HCL 4 MG/2ML IJ SOLN: 4.0000 mg | Freq: Once | INTRAMUSCULAR | Status: DC | PRN

## 2014-09-26 MED ORDER — SUCCINYLCHOLINE CHLORIDE 20 MG/ML IJ SOLN
INTRAMUSCULAR | Status: DC | PRN
  Administered 2014-09-26: 80 mg via INTRAVENOUS

## 2014-09-26 MED ORDER — LACTATED RINGERS IV SOLN
INTRAVENOUS | Status: DC
  Administered 2014-09-26: 10:00:00 via INTRAVENOUS

## 2014-09-26 MED ORDER — BUPIVACAINE-EPINEPHRINE 0.5% -1:200000 IJ SOLN
INTRAMUSCULAR | Status: DC | PRN
  Administered 2014-09-26: 10 mL

## 2014-09-26 MED ORDER — SUCCINYLCHOLINE CHLORIDE 20 MG/ML IJ SOLN
INTRAMUSCULAR | Status: AC
  Filled 2014-09-26: qty 1

## 2014-09-26 SURGICAL SUPPLY — 46 items
APL SKNCLS STERI-STRIP NONHPOA (GAUZE/BANDAGES/DRESSINGS)
APPLIER CLIP 11 MED OPEN (CLIP) ×3
APR CLP MED 11 20 MLT OPN (CLIP) ×1
BENZOIN TINCTURE PRP APPL 2/3 (GAUZE/BANDAGES/DRESSINGS) IMPLANT
BLADE SURG 10 STRL SS (BLADE) IMPLANT
BLADE SURG 15 STRL LF DISP TIS (BLADE) IMPLANT
BLADE SURG 15 STRL SS (BLADE) ×3
CANISTER SUCTION 2500CC (MISCELLANEOUS) IMPLANT
CHLORAPREP W/TINT 10.5 ML (MISCELLANEOUS) ×3 IMPLANT
CLIP APPLIE 11 MED OPEN (CLIP) IMPLANT
CONT SPEC 4OZ CLIKSEAL STRL BL (MISCELLANEOUS) ×3 IMPLANT
COVER SURGICAL LIGHT HANDLE (MISCELLANEOUS) ×3 IMPLANT
DECANTER SPIKE VIAL GLASS SM (MISCELLANEOUS) ×3 IMPLANT
DRAPE PED LAPAROTOMY (DRAPES) ×3 IMPLANT
DRAPE UTILITY XL STRL (DRAPES) ×6 IMPLANT
DRSG TEGADERM 4X4.75 (GAUZE/BANDAGES/DRESSINGS) ×1 IMPLANT
ELECT CAUTERY BLADE 6.4 (BLADE) ×3 IMPLANT
ELECT REM PT RETURN 9FT ADLT (ELECTROSURGICAL) ×3
ELECTRODE REM PT RTRN 9FT ADLT (ELECTROSURGICAL) ×1 IMPLANT
GAUZE SPONGE 2X2 8PLY STRL LF (GAUZE/BANDAGES/DRESSINGS) ×1 IMPLANT
GAUZE SPONGE 4X4 16PLY XRAY LF (GAUZE/BANDAGES/DRESSINGS) ×3 IMPLANT
GLOVE SURG SIGNA 7.5 PF LTX (GLOVE) ×3 IMPLANT
GOWN STRL REUS W/ TWL LRG LVL3 (GOWN DISPOSABLE) ×1 IMPLANT
GOWN STRL REUS W/ TWL XL LVL3 (GOWN DISPOSABLE) ×1 IMPLANT
GOWN STRL REUS W/TWL LRG LVL3 (GOWN DISPOSABLE) ×3
GOWN STRL REUS W/TWL XL LVL3 (GOWN DISPOSABLE) ×3
KIT BASIN OR (CUSTOM PROCEDURE TRAY) ×3 IMPLANT
KIT ROOM TURNOVER OR (KITS) ×3 IMPLANT
LIQUID BAND (GAUZE/BANDAGES/DRESSINGS) ×3 IMPLANT
NEEDLE HYPO 25GX1X1/2 BEV (NEEDLE) ×3 IMPLANT
NS IRRIG 1000ML POUR BTL (IV SOLUTION) ×3 IMPLANT
PACK SURGICAL SETUP 50X90 (CUSTOM PROCEDURE TRAY) ×3 IMPLANT
PAD ARMBOARD 7.5X6 YLW CONV (MISCELLANEOUS) ×6 IMPLANT
PENCIL BUTTON HOLSTER BLD 10FT (ELECTRODE) ×3 IMPLANT
SPONGE GAUZE 2X2 STER 10/PKG (GAUZE/BANDAGES/DRESSINGS)
SUT MNCRL AB 4-0 PS2 18 (SUTURE) ×3 IMPLANT
SUT VIC AB 3-0 SH 27 (SUTURE) ×3
SUT VIC AB 3-0 SH 27XBRD (SUTURE) ×1 IMPLANT
SYR BULB IRRIGATION 50ML (SYRINGE) ×2 IMPLANT
SYR CONTROL 10ML LL (SYRINGE) ×3 IMPLANT
TOWEL OR 17X24 6PK STRL BLUE (TOWEL DISPOSABLE) ×3 IMPLANT
TOWEL OR 17X26 10 PK STRL BLUE (TOWEL DISPOSABLE) ×3 IMPLANT
TUBE CONNECTING 12'X1/4 (SUCTIONS)
TUBE CONNECTING 12X1/4 (SUCTIONS) IMPLANT
WATER STERILE IRR 1000ML POUR (IV SOLUTION) IMPLANT
YANKAUER SUCT BULB TIP NO VENT (SUCTIONS) IMPLANT

## 2014-09-26 NOTE — Anesthesia Preprocedure Evaluation (Signed)
Anesthesia Evaluation  Patient identified by MRN, date of birth, ID band Patient awake    Reviewed: Allergy & Precautions, NPO status , Patient's Chart, lab work & pertinent test results  Airway Mallampati: I       Dental   Pulmonary COPDformer smoker,  + rhonchi   + decreased breath sounds      Cardiovascular hypertension, + Past MI Normal cardiovascular exam    Neuro/Psych Depression    GI/Hepatic hiatal hernia, PUD, GERD-  ,(+)     substance abuse  ,   Endo/Other    Renal/GU      Musculoskeletal  (+) Arthritis -,   Abdominal   Peds  Hematology   Anesthesia Other Findings NH Lympoma  Reproductive/Obstetrics                             Anesthesia Physical Anesthesia Plan  ASA: III  Anesthesia Plan: General   Post-op Pain Management:    Induction: Intravenous  Airway Management Planned: Oral ETT  Additional Equipment:   Intra-op Plan:   Post-operative Plan: Extubation in OR  Informed Consent: I have reviewed the patients History and Physical, chart, labs and discussed the procedure including the risks, benefits and alternatives for the proposed anesthesia with the patient or authorized representative who has indicated his/her understanding and acceptance.     Plan Discussed with: CRNA, Anesthesiologist and Surgeon  Anesthesia Plan Comments:         Anesthesia Quick Evaluation

## 2014-09-26 NOTE — Interval H&P Note (Signed)
History and Physical Interval Note:no change in H and P  09/26/2014 9:24 AM  Skylier Kilburg  has presented today for surgery, with the diagnosis of Lymphedema  The various methods of treatment have been discussed with the patient and family. After consideration of risks, benefits and other options for treatment, the patient has consented to  Procedure(s): EXCISIONAL BIOPSY LEFT AXILLARY LYMPH NODE (Left) as a surgical intervention .  The patient's history has been reviewed, patient examined, no change in status, stable for surgery.  I have reviewed the patient's chart and labs.  Questions were answered to the patient's satisfaction.     Stephany Poorman A

## 2014-09-26 NOTE — Anesthesia Procedure Notes (Signed)
Procedure Name: Intubation Performed by: Gean Maidens Pre-anesthesia Checklist: Patient identified, Emergency Drugs available, Suction available, Timeout performed and Patient being monitored Patient Re-evaluated:Patient Re-evaluated prior to inductionOxygen Delivery Method: Circle system utilized Preoxygenation: Pre-oxygenation with 100% oxygen Intubation Type: IV induction Ventilation: Mask ventilation without difficulty Laryngoscope Size: Mac, 3 and 4 Grade View: Grade I Tube type: Oral Tube size: 7.5 mm Number of attempts: 1 Airway Equipment and Method: Stylet Placement Confirmation: ETT inserted through vocal cords under direct vision,  positive ETCO2,  CO2 detector and breath sounds checked- equal and bilateral Secured at: 23 cm Tube secured with: Tape Dental Injury: Teeth and Oropharynx as per pre-operative assessment

## 2014-09-26 NOTE — Discharge Instructions (Signed)
Ok to shower starting tomorrow  Ice pack also for pain

## 2014-09-26 NOTE — Anesthesia Postprocedure Evaluation (Signed)
  Anesthesia Post-op Note  Patient: Jim Beck  Procedure(s) Performed: Procedure(s): EXCISIONAL BIOPSY LEFT AXILLARY LYMPH NODE (Left)  Patient Location: PACU  Anesthesia Type:General  Level of Consciousness: awake, alert , oriented and patient cooperative  Airway and Oxygen Therapy: Patient Spontanous Breathing  Post-op Pain: none  Post-op Assessment: Post-op Vital signs reviewed, Patient's Cardiovascular Status Stable, Respiratory Function Stable, Patent Airway, No signs of Nausea or vomiting and Pain level controlled  Post-op Vital Signs: stable  Last Vitals:  Filed Vitals:   09/26/14 1100  BP:   Pulse: 75  Temp:   Resp: 24    Complications: No apparent anesthesia complications

## 2014-09-26 NOTE — Op Note (Signed)
EXCISIONAL BIOPSY LEFT AXILLARY LYMPH NODE  Procedure Note  Jim Beck 09/26/2014   Pre-op Diagnosis: Lymphadenopathy     Post-op Diagnosis: same  Procedure(s): EXCISIONAL BIOPSY LEFT AXILLARY LYMPH NODE  Surgeon(s): Coralie Keens, MD  Anesthesia: General  Staff:  Circulator: Dory Peru, RN; Kennieth Francois, RN Scrub Person: Haydee Monica  Estimated Blood Loss: Minimal               Specimens: sent to path          [facility] A   Date: 09/26/2014  Time: 10:51 AM

## 2014-09-26 NOTE — Transfer of Care (Signed)
Immediate Anesthesia Transfer of Care Note  Patient: Jim Beck  Procedure(s) Performed: Procedure(s): EXCISIONAL BIOPSY LEFT AXILLARY LYMPH NODE (Left)  Patient Location: PACU  Anesthesia Type:General  Level of Consciousness: awake and alert   Airway & Oxygen Therapy: Patient Spontanous Breathing and Patient connected to nasal cannula oxygen  Post-op Assessment: Report given to RN and Post -op Vital signs reviewed and stable  Post vital signs: Reviewed and stable  Last Vitals:  Filed Vitals:   09/26/14 1059  BP:   Pulse:   Temp: 36.7 C  Resp:     Complications: No apparent anesthesia complications

## 2014-09-27 ENCOUNTER — Encounter (HOSPITAL_COMMUNITY): Payer: Self-pay | Admitting: Surgery

## 2014-09-27 NOTE — Op Note (Signed)
NAMEKALMEN, LOLLAR                 ACCOUNT NO.:  [id number]  MEDICAL RECORD NO.:  [id number]  LOCATION:  [facility]                         FACILITY:  [facility]  PHYSICIAN:  Coralie Keens, M.D. DATE OF BIRTH:  11-Jun-1932  DATE OF PROCEDURE:  09/26/2014 DATE OF DISCHARGE:  09/26/2014                              OPERATIVE REPORT   PREOPERATIVE DIAGNOSIS:  Lymphadenopathy.  POSTOPERATIVE DIAGNOSIS:  Lymphadenopathy.  PROCEDURE:  Excisional biopsy of left axillary lymph node.  SURGEON:  Coralie Keens, M.D.  ANESTHESIA:  General and 0.5% Marcaine.  ESTIMATED BLOOD LOSS:  Minimal.  INDICATIONS:  This is a 82 year old gentleman with previous lymphoma who now has potential recurrent lymphoma.  The decision has been made to proceed with excisional biopsy of several axillary lymph nodes for histologic evaluation.  PROCEDURE IN DETAIL:  The patient was brought to the operating room, identified as [employer].  He was placed supine on the operating room table and general anesthesia was induced.  His left axilla was then prepped and draped in usual sterile fashion.  I anesthetized the skin with Marcaine and then made a small incision with a scalpel.  I took this down to the axillary tissue with electrocautery.  I was able to palpate several large lymph nodes.  I was able to grasp one with Allis clamp and this caused several to be elevated up into the incision.  I then easily excised these with electrocautery and surgical clips.  These nodes were then sent to Pathology for evaluation.  I palpated in the axilla and found multiple other enlarged lymph nodes.  At this point, hemostasis appeared to be achieved.  I anesthetized the wound further with Marcaine.  I closed the subcutaneous tissue with interrupted 3-0 Vicryl sutures and closed the skin with running 4-0 Monocryl.  Skin glue was then applied.  The patient tolerated the procedure well.  All the counts were correct at the end of the  procedure.  The patient then extubated in the operating room and taken in a stable condition to the recovery room.     Coralie Keens, M.D.     DB/MEDQ  D:  09/26/2014  T:  09/27/2014  Job:  [id number]

## 2014-10-02 ENCOUNTER — Ambulatory Visit: Payer: Medicare Other | Admitting: Internal Medicine

## 2014-10-02 ENCOUNTER — Other Ambulatory Visit: Payer: Medicare Other

## 2014-10-19 ENCOUNTER — Telehealth: Payer: Self-pay | Admitting: Internal Medicine

## 2014-10-19 NOTE — Telephone Encounter (Signed)
pt came in to r/s missed appt on 6/7-gave pt copy of updated avs

## 2014-11-12 ENCOUNTER — Telehealth: Payer: Self-pay | Admitting: Internal Medicine

## 2014-11-12 ENCOUNTER — Ambulatory Visit (HOSPITAL_BASED_OUTPATIENT_CLINIC_OR_DEPARTMENT_OTHER): Payer: Medicare Other | Admitting: Internal Medicine

## 2014-11-12 ENCOUNTER — Encounter: Payer: Self-pay | Admitting: Internal Medicine

## 2014-11-12 ENCOUNTER — Other Ambulatory Visit (HOSPITAL_BASED_OUTPATIENT_CLINIC_OR_DEPARTMENT_OTHER): Payer: Medicare Other

## 2014-11-12 DIAGNOSIS — C8190 Hodgkin lymphoma, unspecified, unspecified site: Secondary | ICD-10-CM

## 2014-11-12 DIAGNOSIS — C8590 Non-Hodgkin lymphoma, unspecified, unspecified site: Secondary | ICD-10-CM

## 2014-11-12 LAB — COMPREHENSIVE METABOLIC PANEL (CC13)
ALT: 11 U/L (ref 0–55)
AST: 13 U/L (ref 5–34)
Albumin: 3.6 g/dL (ref 3.5–5.0)
Alkaline Phosphatase: 46 U/L (ref 40–150)
Anion Gap: 6 mEq/L (ref 3–11)
BUN: 16.4 mg/dL (ref 7.0–26.0)
CO2: 26 mEq/L (ref 22–29)
Calcium: 9.4 mg/dL (ref 8.4–10.4)
Chloride: 110 mEq/L — ABNORMAL HIGH (ref 98–109)
Creatinine: 1 mg/dL (ref 0.7–1.3)
EGFR: 79 mL/min/{1.73_m2} — ABNORMAL LOW (ref >90)
GLUCOSE: 85 mg/dL (ref 70–140)
Potassium: 4.3 mEq/L (ref 3.5–5.1)
Sodium: 142 mEq/L (ref 136–145)
TOTAL PROTEIN: 6.9 g/dL (ref 6.4–8.3)
Total Bilirubin: 0.27 mg/dL (ref 0.20–1.20)

## 2014-11-12 LAB — CBC WITH DIFFERENTIAL/PLATELET
BASO%: 0.2 % (ref 0.0–2.0)
BASOS ABS: 0 10*3/uL (ref 0.0–0.1)
EOS ABS: 0.1 10*3/uL (ref 0.0–0.5)
EOS%: 1.1 % (ref 0.0–7.0)
HCT: 32.7 % — ABNORMAL LOW (ref 38.4–49.9)
HGB: 10.2 g/dL — ABNORMAL LOW (ref 13.0–17.1)
LYMPH%: 44.7 % (ref 14.0–49.0)
MCH: 24.3 pg — ABNORMAL LOW (ref 27.2–33.4)
MCHC: 31.1 g/dL — ABNORMAL LOW (ref 32.0–36.0)
MCV: 78.1 fL — ABNORMAL LOW (ref 79.3–98.0)
MONO#: 0.7 10*3/uL (ref 0.1–0.9)
MONO%: 14 % (ref 0.0–14.0)
NEUT#: 2.1 10*3/uL (ref 1.5–6.5)
NEUT%: 40 % (ref 39.0–75.0)
Platelets: 191 10*3/uL (ref 140–400)
RBC: 4.19 10*6/uL — ABNORMAL LOW (ref 4.20–5.82)
RDW: 21 % — ABNORMAL HIGH (ref 11.0–14.6)
WBC: 5.2 10*3/uL (ref 4.0–10.3)
lymph#: 2.3 10*3/uL (ref 0.9–3.3)

## 2014-11-12 LAB — LACTATE DEHYDROGENASE (CC13): LDH: 222 U/L (ref 125–245)

## 2014-11-12 NOTE — Progress Notes (Signed)
[facility] [phone]   [phone]  OFFICE PROGRESS NOTE  Kandice Hams, MD [address] [place] [address]  Principle Diagnosis: Hodgkin's lymphoma, initially diagnosed in 1979,recurrence in April 2011  Prior Therapy: Status post radiation 4 4000 CT Y without systemic chemotherapy  Current therapy: None  INTERVAL HISTORY: Jim Beck 81 y.o. male returns to the clinic today for follow-up visit. The patient is feeling fine today with no specific complaints except for the swelling in the left axilla. He recently underwent excisional biopsy of the left axillary lymph node by Dr. Ninfa Linden. 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 The patient is here today for evaluation and discussion of his treatment options. He denied having any significant fever or chills. He has no nausea or vomiting. The patient denied having any significant chest pain, shortness of breath, cough or hemoptysis. No significant weight loss or night sweats.   MEDICAL HISTORY: Past Medical History  Diagnosis Date  . COPD (chronic obstructive pulmonary disease)   . Tobacco abuse   . Hypertension   . Hyperlipemia   . GERD (gastroesophageal reflux disease)     large hiatal hernia and stricture  . Hx of blood diseases     bullous blood disease  . PUD (peptic ulcer disease)   . BPH (benign prostatic hypertrophy)   . History of alcohol abuse   . Heart murmur   . Diverticulosis   . External hemorrhoids   . Arthritis   . Myocardial infarction   . Substance abuse   . Personal history of colonic polyps - adenomas 11/30/2013    12 mm and diminutive adenoma - no recall planned (age)  . Shortness of breath dyspnea     with exertion  . Depression   . Constipation   . History of hiatal hernia     per notes  . Hodgkin lymphoma 07/2009    head and neck and throat     ALLERGIES:  is allergic to aspirin.  MEDICATIONS:  Current Outpatient Prescriptions  Medication Sig Dispense Refill  . amLODipine (NORVASC) 5 MG tablet Take 5 mg by mouth daily.    Marland Kitchen aspirin EC 81 MG tablet Take 81 mg by mouth daily.    Marland Kitchen HYDROcodone-acetaminophen (NORCO) 5-325 MG per tablet Take 1-2 tablets by mouth every 4 (four) hours as needed. 30 tablet 0  . magnesium hydroxide (MILK OF MAGNESIA) 400 MG/5ML suspension Take by mouth daily as needed for mild constipation.    . rosuvastatin (CRESTOR) 10 MG tablet Take 10 mg by mouth every evening.      No current facility-administered medications for this visit.    SURGICAL HISTORY:  Past Surgical History  Procedure Laterality Date  . Inguinal hernia repair      bilateral  . Upper gastrointestinal endoscopy  03/08/2002    esophageal stricture, hiatal hernia  . Colonoscopy  03/08/2002    diverticulosis, external hemorrhoids  . Lung surgery  2011  . Esophageal dilation      x2  . Throat cancer    . Lymph node biopsy Left 09/26/2014    Procedure: EXCISIONAL BIOPSY LEFT AXILLARY LYMPH NODE;  Surgeon: Coralie Keens, MD;  Location: [facility];  Service: General;  Laterality: Left;    REVIEW OF SYSTEMS:  Constitutional: positive for fatigue Eyes: negative Ears, nose, mouth, throat, and face: negative Respiratory: negative Cardiovascular: negative Gastrointestinal: negative Genitourinary:negative Integument/breast:  negative Hematologic/lymphatic: negative Musculoskeletal:negative Neurological: negative Behavioral/Psych: negative Endocrine: negative Allergic/Immunologic: negative   PHYSICAL EXAMINATION: General appearance: alert, cooperative, fatigued and no distress Head: Normocephalic, without obvious abnormality, atraumatic Neck: no adenopathy, no JVD, supple, symmetrical, trachea midline and thyroid not enlarged, symmetric, no tenderness/mass/nodules Lymph nodes: Axillary adenopathy: 2-3 cm palpable left axillary  lymphadenopathy Resp: clear to auscultation bilaterally Back: symmetric, no curvature. ROM normal. No CVA tenderness. Cardio: regular rate and rhythm, S1, S2 normal, no murmur, click, rub or gallop GI: soft, non-tender; bowel sounds normal; no masses,  no organomegaly Extremities: extremities normal, atraumatic, no cyanosis or edema Neurologic: Alert and oriented X 3, normal strength and tone. Normal symmetric reflexes. Normal coordination and gait  ECOG PERFORMANCE STATUS: 1 - Symptomatic but completely ambulatory  There were no vitals taken for this visit.  LABORATORY DATA: Lab Results  Component Value Date   WBC 5.2 11/12/2014   HGB 10.2* 11/12/2014   HCT 32.7* 11/12/2014   MCV 78.1* 11/12/2014   PLT 191 11/12/2014      Chemistry      Component Value Date/Time   NA 142 11/12/2014 1342   NA 138 09/21/2014 1521   K 4.3 11/12/2014 1342   K 4.4 09/21/2014 1521   CL 105 09/21/2014 1521   CO2 26 11/12/2014 1342   CO2 26 09/21/2014 1521   BUN 16.4 11/12/2014 1342   BUN 17 09/21/2014 1521   CREATININE 1.0 11/12/2014 1342   CREATININE 1.10 09/21/2014 1521      Component Value Date/Time   CALCIUM 9.4 11/12/2014 1342   CALCIUM 9.1 09/21/2014 1521   ALKPHOS 46 11/12/2014 1342   ALKPHOS 48 10/16/2013 1151   AST 13 11/12/2014 1342   AST 23 10/16/2013 1151   ALT 11 11/12/2014 1342   ALT 24 10/16/2013 1151   BILITOT 0.27 11/12/2014 1342   BILITOT 0.5 10/16/2013 1151       RADIOGRAPHIC STUDIES: No results found.  ASSESSMENT AND PLAN: This is a very pleasant 81 years old African-American male with recurrent nodular lymphocyte predominant Hodgkin lymphoma presenting with large left axillary lymphadenopathy in addition to prevascular lymphadenopathy seen on the previous scan. I discussed the pathology report with the patient today. I recommended for him to complete the staging workup by ordering a PET scan in addition to bone marrow biopsy and aspirate. I will also arrange for  the patient to have pulmonary function test and 2-D echo for evaluation of his pulmonary and cardiac function before starting systemic therapy. I would see the patient back for follow-up visit in 2 weeks for reevaluation and discussion of his treatment options based on the final staging workup. He was advised to call immediately if he has any concerning symptoms in the interval. The patient voices understanding of current disease status and treatment options and is in agreement with the current care plan.  All questions were answered. The patient knows to call the clinic with any problems, questions or concerns. We can certainly see the patient much sooner if necessary.  I spent 15 minutes counseling the patient face to face. The total time spent in the appointment was 25 minutes.  Disclaimer: This note was dictated with voice recognition software. Similar sounding words can inadvertently be transcribed and may not be corrected upon review.

## 2014-11-12 NOTE — Telephone Encounter (Signed)
per pof to sch pt appt-sch pt PFT, sent [facility] email to [facility] after reply-adv pt [facility] will call to sch scans/PET-gave copy of avs

## 2014-11-12 NOTE — Telephone Encounter (Signed)
per pof to sch pt appt-sch pt PFT, sent [facility] email to [employer] [facility] after reply-adv pt [facility] will call to sch scans/PET

## 2014-11-19 ENCOUNTER — Other Ambulatory Visit (HOSPITAL_COMMUNITY): Payer: Self-pay | Admitting: Respiratory Therapy

## 2014-11-19 ENCOUNTER — Ambulatory Visit (HOSPITAL_COMMUNITY)
Admission: RE | Admit: 2014-11-19 | Discharge: 2014-11-19 | Disposition: A | Discharge status: Home / Self Care | Payer: Medicare Other | Source: Ambulatory Visit | Attending: Internal Medicine | Admitting: Internal Medicine

## 2014-11-19 ENCOUNTER — Ambulatory Visit (HOSPITAL_COMMUNITY)
Admission: RE | Admit: 2014-11-19 | Discharge: 2014-11-19 | Disposition: A | Discharge status: Home / Self Care | Payer: Medicare Other | Source: Ambulatory Visit

## 2014-11-19 ENCOUNTER — Telehealth: Payer: Self-pay | Admitting: Internal Medicine

## 2014-11-19 ENCOUNTER — Ambulatory Visit (HOSPITAL_COMMUNITY): Admission: RE | Admit: 2014-11-19 | Payer: Medicare Other | Source: Ambulatory Visit

## 2014-11-19 DIAGNOSIS — C8190 Hodgkin lymphoma, unspecified, unspecified site: Secondary | ICD-10-CM

## 2014-11-19 MED ORDER — ALBUTEROL SULFATE (2.5 MG/3ML) 0.083% IN NEBU: 2.5000 mg | INHALATION_SOLUTION | Freq: Once | RESPIRATORY_TRACT | Status: DC

## 2014-11-19 MED ORDER — ALBUTEROL SULFATE (2.5 MG/3ML) 0.083% IN NEBU
2.5000 mg | INHALATION_SOLUTION | Freq: Once | RESPIRATORY_TRACT | Status: AC
  Administered 2014-11-19: 2.5 mg via RESPIRATORY_TRACT

## 2014-11-20 ENCOUNTER — Ambulatory Visit (HOSPITAL_COMMUNITY)
Admission: RE | Admit: 2014-11-20 | Discharge: 2014-11-20 | Disposition: A | Discharge status: Home / Self Care | Payer: Medicare Other | Source: Ambulatory Visit | Attending: Internal Medicine | Admitting: Internal Medicine

## 2014-11-20 ENCOUNTER — Ambulatory Visit (HOSPITAL_COMMUNITY): Admission: RE | Admit: 2014-11-20 | Payer: Medicare Other | Source: Ambulatory Visit

## 2014-11-20 ENCOUNTER — Ambulatory Visit (HOSPITAL_COMMUNITY): Payer: Medicare Other

## 2014-11-20 DIAGNOSIS — K4020 Bilateral inguinal hernia, without obstruction or gangrene, not specified as recurrent: Secondary | ICD-10-CM | POA: Diagnosis not present

## 2014-11-20 DIAGNOSIS — R590 Localized enlarged lymph nodes: Secondary | ICD-10-CM | POA: Insufficient documentation

## 2014-11-20 DIAGNOSIS — R911 Solitary pulmonary nodule: Secondary | ICD-10-CM | POA: Insufficient documentation

## 2014-11-20 DIAGNOSIS — K449 Diaphragmatic hernia without obstruction or gangrene: Secondary | ICD-10-CM | POA: Diagnosis not present

## 2014-11-20 DIAGNOSIS — I700 Atherosclerosis of aorta: Secondary | ICD-10-CM | POA: Diagnosis not present

## 2014-11-20 DIAGNOSIS — C8190 Hodgkin lymphoma, unspecified, unspecified site: Secondary | ICD-10-CM | POA: Diagnosis not present

## 2014-11-20 DIAGNOSIS — N323 Diverticulum of bladder: Secondary | ICD-10-CM | POA: Diagnosis not present

## 2014-11-20 LAB — GLUCOSE, CAPILLARY: GLUCOSE-CAPILLARY: 83 mg/dL (ref 65–99)

## 2014-11-20 MED ORDER — FLUDEOXYGLUCOSE F - 18 (FDG) INJECTION
9.0500 | Freq: Once | INTRAVENOUS | Status: AC | PRN
  Administered 2014-11-20: 9.05 via INTRAVENOUS

## 2014-11-21 LAB — PULMONARY FUNCTION TEST
DL/VA % pred: 45 %
DL/VA: 2.06 ml/min/mmHg/L
DLCO UNC % PRED: 31 %
DLCO unc: 10.2 ml/min/mmHg
FEF 25-75 PRE: 0.66 L/s
FEF 25-75 Post: 0.49 L/sec
FEF2575-%Change-Post: -26 %
FEF2575-%PRED-POST: 23 %
FEF2575-%Pred-Pre: 31 %
FEV1-%CHANGE-POST: -10 %
FEV1-%Pred-Post: 58 %
FEV1-%Pred-Pre: 64 %
FEV1-Post: 1.56 L
FEV1-Pre: 1.73 L
FEV1FVC-%Change-Post: -10 %
FEV1FVC-%Pred-Pre: 75 %
FEV6-%CHANGE-POST: -8 %
FEV6-%PRED-POST: 77 %
FEV6-%PRED-PRE: 84 %
FEV6-Post: 2.67 L
FEV6-Pre: 2.91 L
FEV6FVC-%Change-Post: -8 %
FEV6FVC-%PRED-POST: 92 %
FEV6FVC-%Pred-Pre: 100 %
FVC-%Change-Post: 0 %
FVC-%Pred-Post: 83 %
FVC-%Pred-Pre: 83 %
FVC-POST: 3.04 L
FVC-Pre: 3.05 L
POST FEV1/FVC RATIO: 51 %
Post FEV6/FVC ratio: 88 %
Pre FEV1/FVC ratio: 57 %
Pre FEV6/FVC Ratio: 95 %
RV % PRED: 149 %
RV: 3.98 L
TLC % pred: 101 %
TLC: 7.19 L

## 2014-11-22 ENCOUNTER — Other Ambulatory Visit: Payer: Self-pay | Admitting: Physician Assistant

## 2014-11-23 ENCOUNTER — Ambulatory Visit (HOSPITAL_COMMUNITY): Admission: RE | Admit: 2014-11-23 | Payer: Medicare Other | Source: Ambulatory Visit

## 2014-11-26 ENCOUNTER — Other Ambulatory Visit: Payer: Self-pay | Admitting: Physician Assistant

## 2014-11-26 DIAGNOSIS — C8190 Hodgkin lymphoma, unspecified, unspecified site: Secondary | ICD-10-CM

## 2014-11-27 ENCOUNTER — Encounter: Payer: Self-pay | Admitting: Physician Assistant

## 2014-11-27 ENCOUNTER — Telehealth: Payer: Self-pay | Admitting: *Deleted

## 2014-11-27 ENCOUNTER — Ambulatory Visit (HOSPITAL_BASED_OUTPATIENT_CLINIC_OR_DEPARTMENT_OTHER): Payer: Medicare Other | Admitting: Physician Assistant

## 2014-11-27 ENCOUNTER — Telehealth: Payer: Self-pay | Admitting: Physician Assistant

## 2014-11-27 ENCOUNTER — Other Ambulatory Visit (HOSPITAL_BASED_OUTPATIENT_CLINIC_OR_DEPARTMENT_OTHER): Payer: Medicare Other

## 2014-11-27 VITALS — BP 150/91 | HR 80 | Temp 98.6°F | Resp 17 | Ht 71.0 in | Wt 180.9 lb

## 2014-11-27 DIAGNOSIS — R1319 Other dysphagia: Secondary | ICD-10-CM

## 2014-11-27 DIAGNOSIS — C8190 Hodgkin lymphoma, unspecified, unspecified site: Secondary | ICD-10-CM

## 2014-11-27 LAB — CBC WITH DIFFERENTIAL/PLATELET
BASO%: 0.4 % (ref 0.0–2.0)
Basophils Absolute: 0 10*3/uL (ref 0.0–0.1)
EOS%: 1.5 % (ref 0.0–7.0)
Eosinophils Absolute: 0.1 10*3/uL (ref 0.0–0.5)
HCT: 34.7 % — ABNORMAL LOW (ref 38.4–49.9)
HGB: 10.7 g/dL — ABNORMAL LOW (ref 13.0–17.1)
LYMPH#: 2 10*3/uL (ref 0.9–3.3)
LYMPH%: 50.2 % — ABNORMAL HIGH (ref 14.0–49.0)
MCH: 24.4 pg — ABNORMAL LOW (ref 27.2–33.4)
MCHC: 30.9 g/dL — ABNORMAL LOW (ref 32.0–36.0)
MCV: 78.9 fL — ABNORMAL LOW (ref 79.3–98.0)
MONO#: 0.5 10*3/uL (ref 0.1–0.9)
MONO%: 13.5 % (ref 0.0–14.0)
NEUT#: 1.4 10*3/uL — ABNORMAL LOW (ref 1.5–6.5)
NEUT%: 34.4 % — AB (ref 39.0–75.0)
Platelets: 177 10*3/uL (ref 140–400)
RBC: 4.4 10*6/uL (ref 4.20–5.82)
RDW: 21.1 % — AB (ref 11.0–14.6)
WBC: 4.1 10*3/uL (ref 4.0–10.3)

## 2014-11-27 LAB — COMPREHENSIVE METABOLIC PANEL (CC13)
ALT: 12 U/L (ref 0–55)
ANION GAP: 4 meq/L (ref 3–11)
AST: 15 U/L (ref 5–34)
Albumin: 3.6 g/dL (ref 3.5–5.0)
Alkaline Phosphatase: 50 U/L (ref 40–150)
BUN: 13.7 mg/dL (ref 7.0–26.0)
CO2: 28 meq/L (ref 22–29)
Calcium: 9.1 mg/dL (ref 8.4–10.4)
Chloride: 109 mEq/L (ref 98–109)
Creatinine: 0.9 mg/dL (ref 0.7–1.3)
EGFR: 88 mL/min/{1.73_m2} — ABNORMAL LOW (ref >90)
GLUCOSE: 93 mg/dL (ref 70–140)
Potassium: 4.2 mEq/L (ref 3.5–5.1)
Sodium: 141 mEq/L (ref 136–145)
Total Bilirubin: 0.4 mg/dL (ref 0.20–1.20)
Total Protein: 6.8 g/dL (ref 6.4–8.3)

## 2014-11-27 LAB — LACTATE DEHYDROGENASE (CC13): LDH: 212 U/L (ref 125–245)

## 2014-11-27 MED ORDER — PROCHLORPERAZINE MALEATE 10 MG PO TABS: 10.0000 mg | ORAL_TABLET | Freq: Four times a day (QID) | ORAL | Status: DC | PRN

## 2014-11-27 MED ORDER — ALLOPURINOL 100 MG PO TABS
100.0000 mg | ORAL_TABLET | Freq: Two times a day (BID) | ORAL | Status: AC
Start: 2014-11-27 — End: ?

## 2014-11-27 NOTE — Telephone Encounter (Signed)
Per staff message and POF I have scheduled appts. Advised scheduler of appts. JMW  

## 2014-11-27 NOTE — Progress Notes (Addendum)
[place] [phone]   [phone]  OFFICE PROGRESS NOTE  Kandice Hams, MD [address] [place] [address]  Principle Diagnosis: Hodgkin's lymphoma, initially diagnosed in 1979,recurrence in April 2011  Prior Therapy: Status post radiation 4 4000 CT Y without systemic chemotherapy  Current therapy: None  INTERVAL HISTORY: Jim Beck 81 y.o. male returns to the clinic today for follow-up visit. The patient is feeling fine today with no specific complaints except for difficulty swallowing particularly with solid foods. this is been an intermittent issue and he was previously seen by Dr. Carlean Purl and had esophageal dilation on 2 separate occasions. the swelling in the left axilla. He recently underwent excisional biopsy of the left axillary lymph node by Dr. Ninfa Linden. 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 To complete his staging workup, he recently had a PET scan as well as pulmonary function studies.The patient is here today for evaluation and discussion of his treatment options. He denied having any significant fever or chills. He has no nausea or vomiting. The patient denied having any significant chest pain, shortness of breath, cough or hemoptysis. No significant weight loss or night sweats. Patient reports that he did have some chemotherapy when he was initially diagnosed with Hodgkin's lymphoma back in 1979.   MEDICAL HISTORY: Past Medical History  Diagnosis Date  . COPD (chronic obstructive pulmonary disease)   . Tobacco abuse   . Hypertension   . Hyperlipemia   . GERD (gastroesophageal reflux disease)     large hiatal hernia and stricture  . Hx of blood diseases     bullous blood disease  . PUD (peptic ulcer disease)   . BPH (benign prostatic hypertrophy)   . History of alcohol abuse   . Heart murmur   . Diverticulosis     . External hemorrhoids   . Arthritis   . Myocardial infarction   . Substance abuse   . Personal history of colonic polyps - adenomas 11/30/2013    12 mm and diminutive adenoma - no recall planned (age)  . Shortness of breath dyspnea     with exertion  . Depression   . Constipation   . History of hiatal hernia     per notes  . Hodgkin lymphoma 07/2009    head and neck and throat    ALLERGIES:  is allergic to aspirin.  MEDICATIONS:  Current Outpatient Prescriptions  Medication Sig Dispense Refill  . amLODipine (NORVASC) 5 MG tablet Take 5 mg by mouth daily.    Marland Kitchen aspirin EC 81 MG tablet Take 81 mg by mouth daily.    . magnesium hydroxide (MILK OF MAGNESIA) 400 MG/5ML suspension Take by mouth daily as needed for mild constipation.    . rosuvastatin (CRESTOR) 10 MG tablet Take 10 mg by mouth every evening.     Marland Kitchen HYDROcodone-acetaminophen (NORCO) 5-325 MG per tablet Take 1-2 tablets by mouth every 4 (four) hours as needed. (Patient not taking: Reported on 11/27/2014) 30 tablet 0  . prochlorperazine (COMPAZINE) 10 MG tablet Take 1 tablet (10 mg total) by mouth every 6 (six) hours as needed for nausea or vomiting. 30 tablet 1   No current facility-administered medications for this visit.    SURGICAL HISTORY:  Past Surgical History  Procedure Laterality Date  . Inguinal hernia repair      bilateral  . Upper gastrointestinal endoscopy  03/08/2002  esophageal stricture, hiatal hernia  . Colonoscopy  03/08/2002    diverticulosis, external hemorrhoids  . Lung surgery  2011  . Esophageal dilation      x2  . Throat cancer    . Lymph node biopsy Left 09/26/2014    Procedure: EXCISIONAL BIOPSY LEFT AXILLARY LYMPH NODE;  Surgeon: Coralie Keens, MD;  Location: [facility];  Service: General;  Laterality: Left;    REVIEW OF SYSTEMS:  Constitutional: positive for fatigue Eyes: negative Ears, nose, mouth, throat, and face: negative Respiratory: negative Cardiovascular:  negative Gastrointestinal: positive for dysphagia and vomiting Genitourinary:negative Integument/breast: negative Hematologic/lymphatic: negative Musculoskeletal:negative Neurological: negative Behavioral/Psych: negative Endocrine: negative Allergic/Immunologic: negative   PHYSICAL EXAMINATION: General appearance: alert, cooperative, fatigued and no distress Head: Normocephalic, without obvious abnormality, atraumatic Neck: no adenopathy, no JVD, supple, symmetrical, trachea midline and thyroid not enlarged, symmetric, no tenderness/mass/nodules Lymph nodes: Cervical, supraclavicular, and axillary nodes normal. and Status post left axillary lymph node excision and biopsy Resp: clear to auscultation bilaterally Back: symmetric, no curvature. ROM normal. No CVA tenderness. Cardio: regular rate and rhythm, S1, S2 normal, no murmur, click, rub or gallop GI: soft, non-tender; bowel sounds normal; no masses,  no organomegaly Extremities: extremities normal, atraumatic, no cyanosis or edema Neurologic: Alert and oriented X 3, normal strength and tone. Normal symmetric reflexes. Normal coordination and gait  ECOG PERFORMANCE STATUS: 1 - Symptomatic but completely ambulatory  Blood pressure 150/91, pulse 80, temperature 98.6 F (37 C), temperature source Oral, resp. rate 17, height 5' 11"  (1.803 m), weight 180 lb 14.4 oz (82.056 kg), SpO2 90 %.  LABORATORY DATA: Lab Results  Component Value Date   WBC 4.1 11/27/2014   HGB 10.7* 11/27/2014   HCT 34.7* 11/27/2014   MCV 78.9* 11/27/2014   PLT 177 11/27/2014      Chemistry      Component Value Date/Time   NA 141 11/27/2014 0849   NA 138 09/21/2014 1521   K 4.2 11/27/2014 0849   K 4.4 09/21/2014 1521   CL 105 09/21/2014 1521   CO2 28 11/27/2014 0849   CO2 26 09/21/2014 1521   BUN 13.7 11/27/2014 0849   BUN 17 09/21/2014 1521   CREATININE 0.9 11/27/2014 0849   CREATININE 1.10 09/21/2014 1521      Component Value Date/Time    CALCIUM 9.1 11/27/2014 0849   CALCIUM 9.1 09/21/2014 1521   ALKPHOS 50 11/27/2014 0849   ALKPHOS 48 10/16/2013 1151   AST 15 11/27/2014 0849   AST 23 10/16/2013 1151   ALT 12 11/27/2014 0849   ALT 24 10/16/2013 1151   BILITOT 0.40 11/27/2014 0849   BILITOT 0.5 10/16/2013 1151       RADIOGRAPHIC STUDIES: Nm Pet Image Initial (pi) Skull Base To Thigh  11/20/2014   CLINICAL DATA:  Subsequent treatment strategy for Hodgkin's lymphoma.  EXAM: NUCLEAR MEDICINE PET SKULL BASE TO THIGH  TECHNIQUE: 9.05 mCi F-18 FDG was injected intravenously. Full-ring PET imaging was performed from the skull base to thigh after the radiotracer. CT data was obtained and used for attenuation correction and anatomic localization.  FASTING BLOOD GLUCOSE:  Value: 83 mg/dl  COMPARISON:  CT neck/ chest dated 08/27/2014. PET-CT dated 12/03/2009.  FINDINGS: NECK  No hypermetabolic lymph nodes in the neck.  Mild asymmetric hypermetabolism in the right tonsil, without correlate on recent enhanced CT, likely physiologic.  CHEST  8 x 6 mm nodule in the posterior right upper lobe (series 7/image 27), previously 7 x 6 mm in 2012, max  SUV 0.8.  Underlying moderate centrilobular and paraseptal emphysematous changes. Left apical pleural parenchymal scarring. No pleural effusion or pneumothorax.  The heart is normal in size.  No pericardial effusion.  Postsurgical seroma in the left axilla related to prior lymphadenectomy. Left axillary lymph nodes measuring up to 2.4 cm short axis (series 4/image 55), max SUV 13.2.  Additional prevascular nodes measuring up to 12 mm short axis (series 4/ image 63), max SUV 3.2. Small right paratracheal, AP window, and subcarinal nodes measuring 8-9 mm short axis, max SUV 2.9. Mild right hilar metabolism, poorly evaluated on unenhanced CT, max SUV 3.1.  ABDOMEN/PELVIS  No abnormal hypermetabolic activity within the liver, pancreas, adrenal glands, or spleen. Spleen is normal in size.  Moderate hiatal  hernia/inverted intrathoracic stomach. Scarring/atrophy in the anterior right kidney. Atherosclerotic calcifications of the abdominal aorta and branch vessels. Right posterolateral bladder diverticulum. Small fat containing bilateral inguinal hernias.  No hypermetabolic lymph nodes in the abdomen. 9 mm short axis right inguinal node (series 4/image 25), max SUV 4.7.  SKELETON  No focal hypermetabolic activity to suggest skeletal metastasis.  IMPRESSION: Postsurgical changes/seroma in the left axilla related to prior left axillary lymph node resection.  Hypermetabolic left axillary, mediastinal, and right hilar lymphadenopathy, compatible with recurrent lymphoma.  Additional 9 mm hypermetabolic right inguinal node, suspicious.  Spleen is normal in size.  8 x 6 mm posterior right upper lobe nodule, minimally increased from 2012, non FDG avid but beneath the size threshold for PET sensitivity. This is unlikely to be related to patient's lymphoma.   Electronically Signed   By: Julian Hy M.D.   On: 11/20/2014 18:03    ASSESSMENT AND PLAN: This is a very pleasant 81 years old African-American male with recurrent nodular lymphocyte predominant Hodgkin lymphoma presenting with large left axillary lymphadenopathy in addition to prevascular lymphadenopathy seen on the previous scan. The patient was discussed with and also seen by Dr. Julien Nordmann. His pulmonary function studies are acceptable to proceed with chemotherapy. The PET scan confirmed hypermetabolic folic left axillary, mediastinal and right hilar lymphadenopathy compatible with recurrent lymphoma. There was an additional 9 mm hypermetabolic right inguinal node that was suspicious. He has not had his 2-D echo performed yet and we will get this rescheduled so that it is performed before he starts his systemic chemotherapy. Dr. Julien Nordmann recommended proceeding with systemic chemotherapy with ABVD to be given every 2 weeks for total of 8 cycles. Patient will be  set up for chemotherapy education class prior to initiation of chemotherapy with ABVD and me expect his first cycle of ABVD to occur in one week. A prescription for Compazine was sent to his pharmacy of record via E scribe for nausea or vomiting.  He'll follow-up in 3 weeks for symptom management visit prior to the start of cycle #2.  He was advised to call immediately if he has any concerning symptoms in the interval. The patient voices understanding of current disease status and treatment options and is in agreement with the current care plan.  All questions were answered. The patient knows to call the clinic with any problems, questions or concerns. We can certainly see the patient much sooner if necessary.  Carlton Adam, PA-C 11/27/2014  ADDENDUM: Hematology/Oncology Attending: I had a face to face encounter with the patient. I recommended his care plan. This is a very pleasant 81 years old African-American male with recurrent Hodgkin's lymphoma presented with lymphadenopathy in the left axilla as well as mediastinal and right  hilar lymphadenopathy. He missed his appointment bone marrow biopsy and aspirate for evaluation of his disease.  His pulmonary function are satisfactory. I had a lengthy discussion with the patient today about his current condition and treatment options. I recommended for the patient treatment with ABVD on days 1 and 15 every 4 weeks. I discussed with the patient adverse effect of this treatment including but not limited to alopecia, myelosuppression, nausea and vomiting, peripheral neuropathy, liver, renal, cardiac or pulmonary dysfunction. He is expected to start the first cycle of his treatment next week. We will arrange for the patient to have a chemotherapy education class before starting the first dose of his treatment. We will also start the patient on Compazine and allopurinol. He would come back for follow-up visit in 3 weeks with the start of day 15 of the  first cycle. He was advised to call immediately if he has any concerning symptoms in the interval.  Disclaimer: This note was dictated with voice recognition software. Similar sounding words can inadvertently be transcribed and may be missed upon review. Eilleen Kempf., MD 11/27/2014

## 2014-11-27 NOTE — Telephone Encounter (Signed)
Advised scheduler to move labs 

## 2014-11-27 NOTE — Telephone Encounter (Signed)
Pt confirmed labs/ov per 08/02 POF, gave pt avs and calendar.... KJ, sent msg to add chemo  °

## 2014-11-27 NOTE — Patient Instructions (Signed)
Keep the appointment for the echocardiogram before starting chemotherapy Your being scheduled for chemotherapy education class for your systemic chemotherapy with ABVD Return in one week to start chemotherapy Follow-up in 3 weeks for reevaluation

## 2014-11-29 ENCOUNTER — Other Ambulatory Visit: Payer: Self-pay | Admitting: Radiology

## 2014-11-30 ENCOUNTER — Ambulatory Visit: Payer: Medicare Other

## 2014-11-30 ENCOUNTER — Other Ambulatory Visit: Payer: Self-pay | Admitting: Radiology

## 2014-11-30 ENCOUNTER — Other Ambulatory Visit: Payer: Self-pay | Admitting: Medical Oncology

## 2014-11-30 ENCOUNTER — Telehealth: Payer: Self-pay | Admitting: Medical Oncology

## 2014-11-30 DIAGNOSIS — C8190 Hodgkin lymphoma, unspecified, unspecified site: Secondary | ICD-10-CM

## 2014-11-30 NOTE — Telephone Encounter (Signed)
I left message for Jim Beck to call me re r/s ECHO

## 2014-12-03 ENCOUNTER — Other Ambulatory Visit: Payer: Self-pay | Admitting: Medical Oncology

## 2014-12-03 ENCOUNTER — Telehealth: Payer: Self-pay | Admitting: Medical Oncology

## 2014-12-03 ENCOUNTER — Encounter (HOSPITAL_COMMUNITY): Payer: Self-pay

## 2014-12-03 ENCOUNTER — Ambulatory Visit (HOSPITAL_COMMUNITY)
Admission: RE | Admit: 2014-12-03 | Discharge: 2014-12-03 | Disposition: A | Discharge status: Home / Self Care | Payer: Medicare Other | Source: Ambulatory Visit | Attending: Internal Medicine | Admitting: Internal Medicine

## 2014-12-03 DIAGNOSIS — I10 Essential (primary) hypertension: Secondary | ICD-10-CM | POA: Diagnosis not present

## 2014-12-03 DIAGNOSIS — E785 Hyperlipidemia, unspecified: Secondary | ICD-10-CM | POA: Insufficient documentation

## 2014-12-03 DIAGNOSIS — I252 Old myocardial infarction: Secondary | ICD-10-CM | POA: Insufficient documentation

## 2014-12-03 DIAGNOSIS — K219 Gastro-esophageal reflux disease without esophagitis: Secondary | ICD-10-CM | POA: Diagnosis not present

## 2014-12-03 DIAGNOSIS — Z79899 Other long term (current) drug therapy: Secondary | ICD-10-CM | POA: Diagnosis not present

## 2014-12-03 DIAGNOSIS — J449 Chronic obstructive pulmonary disease, unspecified: Secondary | ICD-10-CM | POA: Insufficient documentation

## 2014-12-03 DIAGNOSIS — Z808 Family history of malignant neoplasm of other organs or systems: Secondary | ICD-10-CM | POA: Insufficient documentation

## 2014-12-03 DIAGNOSIS — C8190 Hodgkin lymphoma, unspecified, unspecified site: Secondary | ICD-10-CM | POA: Insufficient documentation

## 2014-12-03 DIAGNOSIS — Z7982 Long term (current) use of aspirin: Secondary | ICD-10-CM | POA: Diagnosis not present

## 2014-12-03 DIAGNOSIS — Z801 Family history of malignant neoplasm of trachea, bronchus and lung: Secondary | ICD-10-CM | POA: Diagnosis not present

## 2014-12-03 DIAGNOSIS — Z87891 Personal history of nicotine dependence: Secondary | ICD-10-CM | POA: Insufficient documentation

## 2014-12-03 LAB — PROTIME-INR
INR: 1.1 (ref 0.00–1.49)
Prothrombin Time: 14.4 seconds (ref 11.6–15.2)

## 2014-12-03 LAB — CBC
HCT: 34.6 % — ABNORMAL LOW (ref 39.0–52.0)
Hemoglobin: 10.7 g/dL — ABNORMAL LOW (ref 13.0–17.0)
MCH: 24.6 pg — AB (ref 26.0–34.0)
MCHC: 30.9 g/dL (ref 30.0–36.0)
MCV: 79.5 fL (ref 78.0–100.0)
PLATELETS: 179 10*3/uL (ref 150–400)
RBC: 4.35 MIL/uL (ref 4.22–5.81)
RDW: 20.3 % — ABNORMAL HIGH (ref 11.5–15.5)
WBC: 3.9 10*3/uL — ABNORMAL LOW (ref 4.0–10.5)

## 2014-12-03 LAB — BONE MARROW EXAM

## 2014-12-03 LAB — APTT: aPTT: 30 seconds (ref 24–37)

## 2014-12-03 MED ORDER — SODIUM CHLORIDE 0.9 % IV SOLN
INTRAVENOUS | Status: DC
  Administered 2014-12-03: 08:00:00 via INTRAVENOUS

## 2014-12-03 MED ORDER — FENTANYL CITRATE (PF) 100 MCG/2ML IJ SOLN
INTRAMUSCULAR | Status: AC | PRN
Start: 2014-12-03 — End: 2014-12-03
  Administered 2014-12-03: 50 ug via INTRAVENOUS

## 2014-12-03 MED ORDER — MIDAZOLAM HCL 2 MG/2ML IJ SOLN
INTRAMUSCULAR | Status: AC | PRN
  Administered 2014-12-03 (×2): 1 mg via INTRAVENOUS

## 2014-12-03 MED ORDER — FENTANYL CITRATE (PF) 100 MCG/2ML IJ SOLN
INTRAMUSCULAR | Status: AC
  Filled 2014-12-03: qty 4

## 2014-12-03 MED ORDER — MIDAZOLAM HCL 2 MG/2ML IJ SOLN
INTRAMUSCULAR | Status: AC
  Filled 2014-12-03: qty 6

## 2014-12-03 NOTE — Telephone Encounter (Signed)
Apt for wed at [facility] for 0900 appt. -pt notified.

## 2014-12-03 NOTE — Procedures (Signed)
Technically successful CT guided bone marrow aspiration and biopsy of left iliac crest. No immediate complications.    SignedSandi Mariscal Pager: [phone] 12/03/2014, 9:56 AM

## 2014-12-03 NOTE — H&P (Signed)
Chief Complaint: Patient was seen in consultation today for  CT guided bone marrow biopsy  Referring Physician(s): Mohamed,Mohamed  History of Present Illness: Jim Beck is a 81 y.o. male with history of recurrent Hodgkin's lymphoma who presents today for CT guided bone marrow biopsy for further evaluation.   Past Medical History  Diagnosis Date  . COPD (chronic obstructive pulmonary disease)   . Tobacco abuse   . Hypertension   . Hyperlipemia   . GERD (gastroesophageal reflux disease)     large hiatal hernia and stricture  . Hx of blood diseases     bullous blood disease  . PUD (peptic ulcer disease)   . BPH (benign prostatic hypertrophy)   . History of alcohol abuse   . Heart murmur   . Diverticulosis   . External hemorrhoids   . Arthritis   . Myocardial infarction   . Substance abuse   . Personal history of colonic polyps - adenomas 11/30/2013    12 mm and diminutive adenoma - no recall planned (age)  . Shortness of breath dyspnea     with exertion  . Depression   . Constipation   . History of hiatal hernia     per notes  . Hodgkin lymphoma 07/2009    head and neck and throat    Past Surgical History  Procedure Laterality Date  . Inguinal hernia repair      bilateral  . Upper gastrointestinal endoscopy  03/08/2002    esophageal stricture, hiatal hernia  . Colonoscopy  03/08/2002    diverticulosis, external hemorrhoids  . Lung surgery  2011  . Esophageal dilation      x2  . Throat cancer    . Lymph node biopsy Left 09/26/2014    Procedure: EXCISIONAL BIOPSY LEFT AXILLARY LYMPH NODE;  Surgeon: Coralie Keens, MD;  Location: [facility];  Service: General;  Laterality: Left;    Allergies: Aspirin  Medications: Prior to Admission medications   Medication Sig Start Date End Date Taking? Authorizing Provider  allopurinol (ZYLOPRIM) 100 MG tablet Take 1 tablet (100 mg total) by mouth 2 (two) times daily. 11/27/14  Yes Curt Bears, MD  amLODipine  (NORVASC) 5 MG tablet Take 5 mg by mouth every morning.     Historical Provider, MD  aspirin EC 81 MG tablet Take 81 mg by mouth every morning.     Historical Provider, MD  HYDROcodone-acetaminophen (NORCO) 5-325 MG per tablet Take 1-2 tablets by mouth every 4 (four) hours as needed. Patient not taking: Reported on 11/27/2014 09/26/14   Coralie Keens, MD  magnesium hydroxide (MILK OF MAGNESIA) 400 MG/5ML suspension Take by mouth daily as needed for mild constipation.    Historical Provider, MD  prochlorperazine (COMPAZINE) 10 MG tablet Take 1 tablet (10 mg total) by mouth every 6 (six) hours as needed for nausea or vomiting. 11/27/14   Carlton Adam, PA-C  rosuvastatin (CRESTOR) 10 MG tablet Take 10 mg by mouth every morning.     Historical Provider, MD     Family History  Problem Relation Age of Onset  . Cancer Brother     abdominal  . Lung cancer Brother   . Throat cancer Sister   . Colon cancer Neg Hx   . Esophageal cancer Neg Hx   . Rectal cancer Neg Hx   . Stomach cancer Neg Hx     History   Social History  . Marital Status: Married    Spouse Name: N/A  .  Number of Children: 2  . Years of Education: N/A   Occupational History  .     Social History Main Topics  . Smoking status: Former Smoker  . Smokeless tobacco: Never Used  . Alcohol Use: No  . Drug Use: No  . Sexual Activity: Not on file   Other Topics Concern  . None   Social History Narrative      Review of Systems  Constitutional: Negative for fever and chills.  HENT: Positive for trouble swallowing.   Respiratory: Positive for shortness of breath.        Occ cough  Cardiovascular: Negative for chest pain.  Gastrointestinal: Negative for nausea, vomiting, abdominal pain and blood in stool.  Genitourinary: Negative for dysuria and hematuria.  Musculoskeletal: Negative for back pain.  Neurological:       Occ HA's    Vital Signs: BP 169/96 mmHg  Pulse 63  Temp(Src) 97.5 F (36.4 C) (Oral)  Resp  16  SpO2 96%  Physical Exam  Constitutional: He is oriented to person, place, and time. He appears well-developed and well-nourished.  Cardiovascular: Normal rate and regular rhythm.   Pulmonary/Chest: Effort normal.  distant BS bilat  Abdominal: Soft. Bowel sounds are normal.  Musculoskeletal: Normal range of motion. He exhibits no edema.  Neurological: He is alert and oriented to person, place, and time.    Mallampati Score:     Imaging: Nm Pet Image Initial (pi) Skull Base To Thigh  11/20/2014   CLINICAL DATA:  Subsequent treatment strategy for Hodgkin's lymphoma.  EXAM: NUCLEAR MEDICINE PET SKULL BASE TO THIGH  TECHNIQUE: 9.05 mCi F-18 FDG was injected intravenously. Full-ring PET imaging was performed from the skull base to thigh after the radiotracer. CT data was obtained and used for attenuation correction and anatomic localization.  FASTING BLOOD GLUCOSE:  Value: 83 mg/dl  COMPARISON:  CT neck/ chest dated 08/27/2014. PET-CT dated 12/03/2009.  FINDINGS: NECK  No hypermetabolic lymph nodes in the neck.  Mild asymmetric hypermetabolism in the right tonsil, without correlate on recent enhanced CT, likely physiologic.  CHEST  8 x 6 mm nodule in the posterior right upper lobe (series 7/image 27), previously 7 x 6 mm in 2012, max SUV 0.8.  Underlying moderate centrilobular and paraseptal emphysematous changes. Left apical pleural parenchymal scarring. No pleural effusion or pneumothorax.  The heart is normal in size.  No pericardial effusion.  Postsurgical seroma in the left axilla related to prior lymphadenectomy. Left axillary lymph nodes measuring up to 2.4 cm short axis (series 4/image 55), max SUV 13.2.  Additional prevascular nodes measuring up to 12 mm short axis (series 4/ image 63), max SUV 3.2. Small right paratracheal, AP window, and subcarinal nodes measuring 8-9 mm short axis, max SUV 2.9. Mild right hilar metabolism, poorly evaluated on unenhanced CT, max SUV 3.1.  ABDOMEN/PELVIS   No abnormal hypermetabolic activity within the liver, pancreas, adrenal glands, or spleen. Spleen is normal in size.  Moderate hiatal hernia/inverted intrathoracic stomach. Scarring/atrophy in the anterior right kidney. Atherosclerotic calcifications of the abdominal aorta and branch vessels. Right posterolateral bladder diverticulum. Small fat containing bilateral inguinal hernias.  No hypermetabolic lymph nodes in the abdomen. 9 mm short axis right inguinal node (series 4/image 25), max SUV 4.7.  SKELETON  No focal hypermetabolic activity to suggest skeletal metastasis.  IMPRESSION: Postsurgical changes/seroma in the left axilla related to prior left axillary lymph node resection.  Hypermetabolic left axillary, mediastinal, and right hilar lymphadenopathy, compatible with recurrent lymphoma.  Additional   9 mm hypermetabolic right inguinal node, suspicious.  Spleen is normal in size.  8 x 6 mm posterior right upper lobe nodule, minimally increased from 2012, non FDG avid but beneath the size threshold for PET sensitivity. This is unlikely to be related to patient's lymphoma.   Electronically Signed   By: Sriyesh  Krishnan M.D.   On: 11/20/2014 18:03    Labs:  CBC:  Recent Labs  09/21/14 1521 11/12/14 1342 11/27/14 0849 12/03/14 0704  WBC 5.8 5.2 4.1 3.9*  HGB 10.0* 10.2* 10.7* 10.7*  HCT 33.3* 32.7* 34.7* 34.6*  PLT 196 191 177 179    COAGS:  Recent Labs  12/03/14 0704  INR 1.10  APTT 30    BMP:  Recent Labs  07/26/14 1102 09/21/14 1521 11/12/14 1342 11/27/14 0849  NA 143 138 142 141  K 4.3 4.4 4.3 4.2  CL  --  105  --   --   CO2 25 26 26 28  GLUCOSE 93 88 85 93  BUN 16.6 17 16.4 13.7  CALCIUM 9.2 9.1 9.4 9.1  CREATININE 1.0 1.10 1.0 0.9  GFRNONAA  --  >60  --   --   GFRAA  --  >60  --   --     LIVER FUNCTION TESTS:  Recent Labs  07/26/14 1102 11/12/14 1342 11/27/14 0849  BILITOT 0.38 0.27 0.40  AST 14 13 15  ALT 12 11 12  ALKPHOS 45 46 50  PROT 6.9 6.9 6.8    ALBUMIN 3.6 3.6 3.6    TUMOR MARKERS: No results for input(s): AFPTM, CEA, CA199, CHROMGRNA in the last 8760 hours.  Assessment and Plan: Jim Beck is a 81 y.o. male with history of recurrent Hodgkin's lymphoma who presents today for CT guided bone marrow biopsy for further evaluation. Risks and benefits discussed with the patient/wife including, but not limited to bleeding, infection, damage to adjacent structures or low yield requiring additional tests.All of the patient's questions were answered, patient is agreeable to proceed. Consent signed and in chart.    Thank you for this interesting consult.  I greatly enjoyed meeting Jim Beck and look forward to participating in their care.  A copy of this report was sent to the requesting provider on this date.  Signed: D. Kevin Allred 12/03/2014, 8:18 AM   I spent a total of 15 minutes in face to face in clinical consultation, greater than 50% of which was counseling/coordinating care for CT guided bone marrow biopsy        

## 2014-12-03 NOTE — Discharge Instructions (Signed)
Bone Marrow Aspiration, Bone Marrow Biopsy °Care After °Read the instructions outlined below and refer to this sheet in the next few weeks. These discharge instructions provide you with general information on caring for yourself after you leave the hospital. Your caregiver may also give you specific instructions. While your treatment has been planned according to the most current medical practices available, unavoidable complications occasionally occur. If you have any problems or questions after discharge, call your caregiver. °FINDING OUT THE RESULTS OF YOUR TEST °Not all test results are available during your visit. If your test results are not back during the visit, make an appointment with your caregiver to find out the results. Do not assume everything is normal if you have not heard from your caregiver or the medical facility. It is important for you to follow up on all of your test results.  °HOME CARE INSTRUCTIONS  °You have had sedation and may be sleepy or dizzy. Your thinking may not be as clear as usual. For the next 24 hours: °· Only take over-the-counter or prescription medicines for pain, discomfort, and or fever as directed by your caregiver. °· Do not drink alcohol. °· Do not smoke. °· Do not drive. °· Do not make important legal decisions. °· Do not operate heavy machinery. °· Do not care for small children by yourself. °· Keep your dressing clean and dry. You may replace dressing with a bandage after 24 hours. °· You may take a bath or shower after 24 hours. °· Use an ice pack for 20 minutes every 2 hours while awake for pain as needed. °SEEK MEDICAL CARE IF:  °· There is redness, swelling, or increasing pain at the biopsy site. °· There is pus coming from the biopsy site. °· There is drainage from a biopsy site lasting longer than one day. °· An unexplained oral temperature above 102° F (38.9° C) develops. °SEEK IMMEDIATE MEDICAL CARE IF:  °· You develop a rash. °· You have difficulty  breathing. °· You develop any reaction or side effects to medications given. °Document Released: 10/31/2004 Document Revised: 07/06/2011 Document Reviewed: 04/10/2008 °[employer]® Patient Information ©2015 [employer]. This information is not intended to replace advice given to you by your health care provider. Make sure you discuss any questions you have with your health care provider. °Conscious Sedation, Adult, Care After °Refer to this sheet in the next few weeks. These instructions provide you with information on caring for yourself after your procedure. Your health care provider may also give you more specific instructions. Your treatment has been planned according to current medical practices, but problems sometimes occur. Call your health care provider if you have any problems or questions after your procedure. °WHAT TO EXPECT AFTER THE PROCEDURE  °After your procedure: °· You may feel sleepy, clumsy, and have poor balance for several hours. °· Vomiting may occur if you eat too soon after the procedure. °HOME CARE INSTRUCTIONS °· Do not participate in any activities where you could become injured for at least 24 hours. Do not: °¨ Drive. °¨ Swim. °¨ Ride a bicycle. °¨ Operate heavy machinery. °¨ Cook. °¨ Use power tools. °¨ Climb ladders. °¨ Work from a high place. °· Do not make important decisions or sign legal documents until you are improved. °· If you vomit, drink water, juice, or soup when you can drink without vomiting. Make sure you have little or no nausea before eating solid foods. °· Only take over-the-counter or prescription medicines for pain, discomfort, or fever   as directed by your health care provider.  Make sure you and your family fully understand everything about the medicines given to you, including what side effects may occur.  You should not drink alcohol, take sleeping pills, or take medicines that cause drowsiness for at least 24 hours.  If you smoke, do not smoke without  supervision.  If you are feeling better, you may resume normal activities 24 hours after you were sedated.  Keep all appointments with your health care provider. SEEK MEDICAL CARE IF:  Your skin is pale or bluish in color.  You continue to feel nauseous or vomit.  Your pain is getting worse and is not helped by medicine.  You have bleeding or swelling.  You are still sleepy or feeling clumsy after 24 hours. SEEK IMMEDIATE MEDICAL CARE IF:  You develop a rash.  You have difficulty breathing.  You develop any type of allergic problem.  You have a fever. MAKE SURE YOU:  Understand these instructions.  Will watch your condition.  Will get help right away if you are not doing well or get worse. Document Released: 02/01/2013 Document Reviewed: 02/01/2013 [facility] Patient Information 2015 [place], [place]. This information is not intended to replace advice given to you by your health care provider. Make sure you discuss any questions you have with your health care provider.

## 2014-12-05 ENCOUNTER — Ambulatory Visit: Payer: Medicare Other

## 2014-12-05 ENCOUNTER — Ambulatory Visit (HOSPITAL_COMMUNITY)
Admission: RE | Admit: 2014-12-05 | Discharge: 2014-12-05 | Disposition: A | Discharge status: Home / Self Care | Payer: Medicare Other | Source: Ambulatory Visit | Attending: Internal Medicine | Admitting: Internal Medicine

## 2014-12-05 ENCOUNTER — Other Ambulatory Visit: Payer: Medicare Other

## 2014-12-05 ENCOUNTER — Other Ambulatory Visit: Payer: Self-pay | Admitting: Internal Medicine

## 2014-12-05 DIAGNOSIS — J449 Chronic obstructive pulmonary disease, unspecified: Secondary | ICD-10-CM | POA: Insufficient documentation

## 2014-12-05 DIAGNOSIS — C8190 Hodgkin lymphoma, unspecified, unspecified site: Secondary | ICD-10-CM

## 2014-12-05 DIAGNOSIS — I10 Essential (primary) hypertension: Secondary | ICD-10-CM | POA: Insufficient documentation

## 2014-12-05 DIAGNOSIS — I351 Nonrheumatic aortic (valve) insufficiency: Secondary | ICD-10-CM | POA: Insufficient documentation

## 2014-12-05 DIAGNOSIS — Z720 Tobacco use: Secondary | ICD-10-CM | POA: Diagnosis not present

## 2014-12-05 DIAGNOSIS — I252 Old myocardial infarction: Secondary | ICD-10-CM | POA: Diagnosis not present

## 2014-12-05 DIAGNOSIS — Z79899 Other long term (current) drug therapy: Secondary | ICD-10-CM | POA: Diagnosis not present

## 2014-12-05 DIAGNOSIS — E785 Hyperlipidemia, unspecified: Secondary | ICD-10-CM | POA: Diagnosis not present

## 2014-12-05 NOTE — Progress Notes (Addendum)
  Echocardiogram 2D Echocardiogram has been performed.  Jennette Dubin 12/05/2014, 9:08 AM

## 2014-12-07 LAB — CHROMOSOME ANALYSIS, BONE MARROW

## 2014-12-19 ENCOUNTER — Telehealth: Payer: Self-pay | Admitting: Internal Medicine

## 2014-12-19 ENCOUNTER — Ambulatory Visit (HOSPITAL_BASED_OUTPATIENT_CLINIC_OR_DEPARTMENT_OTHER): Payer: Medicare Other | Admitting: Internal Medicine

## 2014-12-19 ENCOUNTER — Other Ambulatory Visit: Payer: Self-pay

## 2014-12-19 ENCOUNTER — Emergency Department (HOSPITAL_COMMUNITY): Payer: Medicare Other

## 2014-12-19 ENCOUNTER — Emergency Department (HOSPITAL_COMMUNITY)
Admission: EM | Admit: 2014-12-19 | Discharge: 2014-12-19 | Disposition: A | Discharge status: Home / Self Care | Payer: Medicare Other | Attending: Emergency Medicine | Admitting: Emergency Medicine

## 2014-12-19 ENCOUNTER — Encounter (HOSPITAL_COMMUNITY): Payer: Self-pay | Admitting: Emergency Medicine

## 2014-12-19 ENCOUNTER — Ambulatory Visit (HOSPITAL_BASED_OUTPATIENT_CLINIC_OR_DEPARTMENT_OTHER): Payer: Medicare Other

## 2014-12-19 ENCOUNTER — Other Ambulatory Visit (HOSPITAL_BASED_OUTPATIENT_CLINIC_OR_DEPARTMENT_OTHER): Payer: Medicare Other

## 2014-12-19 ENCOUNTER — Encounter: Payer: Self-pay | Admitting: Internal Medicine

## 2014-12-19 VITALS — BP 117/81 | HR 80 | Temp 97.8°F | Resp 18 | Ht 71.0 in | Wt 181.4 lb

## 2014-12-19 VITALS — BP 142/89 | HR 68 | Resp 18

## 2014-12-19 DIAGNOSIS — Z79899 Other long term (current) drug therapy: Secondary | ICD-10-CM | POA: Insufficient documentation

## 2014-12-19 DIAGNOSIS — E785 Hyperlipidemia, unspecified: Secondary | ICD-10-CM | POA: Diagnosis not present

## 2014-12-19 DIAGNOSIS — J449 Chronic obstructive pulmonary disease, unspecified: Secondary | ICD-10-CM | POA: Diagnosis not present

## 2014-12-19 DIAGNOSIS — Z8659 Personal history of other mental and behavioral disorders: Secondary | ICD-10-CM | POA: Diagnosis not present

## 2014-12-19 DIAGNOSIS — C8194 Hodgkin lymphoma, unspecified, lymph nodes of axilla and upper limb: Secondary | ICD-10-CM

## 2014-12-19 DIAGNOSIS — R251 Tremor, unspecified: Secondary | ICD-10-CM | POA: Diagnosis not present

## 2014-12-19 DIAGNOSIS — Z8719 Personal history of other diseases of the digestive system: Secondary | ICD-10-CM | POA: Insufficient documentation

## 2014-12-19 DIAGNOSIS — Z862 Personal history of diseases of the blood and blood-forming organs and certain disorders involving the immune mechanism: Secondary | ICD-10-CM | POA: Insufficient documentation

## 2014-12-19 DIAGNOSIS — Z87438 Personal history of other diseases of male genital organs: Secondary | ICD-10-CM | POA: Diagnosis not present

## 2014-12-19 DIAGNOSIS — R531 Weakness: Secondary | ICD-10-CM | POA: Diagnosis present

## 2014-12-19 DIAGNOSIS — I10 Essential (primary) hypertension: Secondary | ICD-10-CM | POA: Diagnosis not present

## 2014-12-19 DIAGNOSIS — C8190 Hodgkin lymphoma, unspecified, unspecified site: Secondary | ICD-10-CM | POA: Insufficient documentation

## 2014-12-19 DIAGNOSIS — Z87891 Personal history of nicotine dependence: Secondary | ICD-10-CM | POA: Insufficient documentation

## 2014-12-19 DIAGNOSIS — Z86010 Personal history of colonic polyps: Secondary | ICD-10-CM | POA: Insufficient documentation

## 2014-12-19 DIAGNOSIS — R011 Cardiac murmur, unspecified: Secondary | ICD-10-CM | POA: Diagnosis not present

## 2014-12-19 DIAGNOSIS — I252 Old myocardial infarction: Secondary | ICD-10-CM | POA: Diagnosis not present

## 2014-12-19 DIAGNOSIS — Z7982 Long term (current) use of aspirin: Secondary | ICD-10-CM | POA: Diagnosis not present

## 2014-12-19 DIAGNOSIS — M1990 Unspecified osteoarthritis, unspecified site: Secondary | ICD-10-CM | POA: Insufficient documentation

## 2014-12-19 DIAGNOSIS — Z5111 Encounter for antineoplastic chemotherapy: Secondary | ICD-10-CM | POA: Insufficient documentation

## 2014-12-19 LAB — CBC WITH DIFFERENTIAL/PLATELET
BASO%: 0.4 % (ref 0.0–2.0)
BASOS ABS: 0 10*3/uL (ref 0.0–0.1)
Basophils Absolute: 0 10*3/uL (ref 0.0–0.1)
Basophils Relative: 0 % (ref 0–1)
EOS PCT: 1 % (ref 0–5)
EOS%: 1.4 % (ref 0.0–7.0)
Eosinophils Absolute: 0.1 10*3/uL (ref 0.0–0.5)
Eosinophils Absolute: 0 10*3/uL (ref 0.0–0.7)
HCT: 35.4 % — ABNORMAL LOW (ref 38.4–49.9)
HEMATOCRIT: 36.5 % — AB (ref 39.0–52.0)
HEMOGLOBIN: 10.9 g/dL — AB (ref 13.0–17.1)
HEMOGLOBIN: 11.1 g/dL — AB (ref 13.0–17.0)
LYMPH#: 2.1 10*3/uL (ref 0.9–3.3)
LYMPH%: 43.9 % (ref 14.0–49.0)
LYMPHS ABS: 1.4 10*3/uL (ref 0.7–4.0)
LYMPHS PCT: 34 % (ref 12–46)
MCH: 24.7 pg — ABNORMAL LOW (ref 27.2–33.4)
MCH: 24.8 pg — ABNORMAL LOW (ref 26.0–34.0)
MCHC: 30.8 g/dL — ABNORMAL LOW (ref 32.0–36.0)
MCHC: 30.4 g/dL (ref 30.0–36.0)
MCV: 80.3 fL (ref 79.3–98.0)
MCV: 81.5 fL (ref 78.0–100.0)
MONO#: 0.6 10*3/uL (ref 0.1–0.9)
MONO%: 12.3 % (ref 0.0–14.0)
MONOS PCT: 8 % (ref 3–12)
Monocytes Absolute: 0.3 10*3/uL (ref 0.1–1.0)
NEUT#: 2 10*3/uL (ref 1.5–6.5)
NEUT%: 42 % (ref 39.0–75.0)
NRBC: 0 % (ref 0–0)
Neutro Abs: 2.5 10*3/uL (ref 1.7–7.7)
Neutrophils Relative %: 57 % (ref 43–77)
Platelets: 195 10*3/uL (ref 140–400)
Platelets: 198 10*3/uL (ref 150–400)
RBC: 4.41 10*6/uL (ref 4.20–5.82)
RBC: 4.48 MIL/uL (ref 4.22–5.81)
RDW: 20.6 % — AB (ref 11.0–14.6)
RDW: 20.8 % — AB (ref 11.5–15.5)
WBC: 4.9 10*3/uL (ref 4.0–10.3)
WBC: 4.2 10*3/uL (ref 4.0–10.5)

## 2014-12-19 LAB — URINALYSIS, ROUTINE W REFLEX MICROSCOPIC
Bilirubin Urine: NEGATIVE
GLUCOSE, UA: NEGATIVE mg/dL
HGB URINE DIPSTICK: NEGATIVE
KETONES UR: NEGATIVE mg/dL
Leukocytes, UA: NEGATIVE
Nitrite: NEGATIVE
PH: 6.5 (ref 5.0–8.0)
PROTEIN: NEGATIVE mg/dL
Specific Gravity, Urine: 1.015 (ref 1.005–1.030)
Urobilinogen, UA: 0.2 mg/dL (ref 0.0–1.0)

## 2014-12-19 LAB — I-STAT CHEM 8, ED
BUN: 13 mg/dL (ref 6–20)
CALCIUM ION: 1.27 mmol/L (ref 1.13–1.30)
CREATININE: 1 mg/dL (ref 0.61–1.24)
Chloride: 102 mmol/L (ref 101–111)
Glucose, Bld: 141 mg/dL — ABNORMAL HIGH (ref 65–99)
HEMATOCRIT: 42 % (ref 39.0–52.0)
Hemoglobin: 14.3 g/dL (ref 13.0–17.0)
Potassium: 3.9 mmol/L (ref 3.5–5.1)
Sodium: 141 mmol/L (ref 135–145)
TCO2: 26 mmol/L (ref 0–100)

## 2014-12-19 LAB — COMPREHENSIVE METABOLIC PANEL (CC13)
ALBUMIN: 3.6 g/dL (ref 3.5–5.0)
ALK PHOS: 55 U/L (ref 40–150)
ALT: 14 U/L (ref 0–55)
AST: 15 U/L (ref 5–34)
Anion Gap: 7 mEq/L (ref 3–11)
BILIRUBIN TOTAL: 0.51 mg/dL (ref 0.20–1.20)
BUN: 11.8 mg/dL (ref 7.0–26.0)
CO2: 25 mEq/L (ref 22–29)
CREATININE: 1.1 mg/dL (ref 0.7–1.3)
Calcium: 9.5 mg/dL (ref 8.4–10.4)
Chloride: 107 mEq/L (ref 98–109)
EGFR: 77 mL/min/{1.73_m2} — ABNORMAL LOW (ref >90)
GLUCOSE: 105 mg/dL (ref 70–140)
Potassium: 4.2 mEq/L (ref 3.5–5.1)
Sodium: 140 mEq/L (ref 136–145)
TOTAL PROTEIN: 6.7 g/dL (ref 6.4–8.3)

## 2014-12-19 LAB — COMPREHENSIVE METABOLIC PANEL
ALK PHOS: 50 U/L (ref 38–126)
ALT: 14 U/L — AB (ref 17–63)
AST: 20 U/L (ref 15–41)
Albumin: 4 g/dL (ref 3.5–5.0)
Anion gap: 8 (ref 5–15)
BILIRUBIN TOTAL: 0.4 mg/dL (ref 0.3–1.2)
BUN: 12 mg/dL (ref 6–20)
CALCIUM: 9.3 mg/dL (ref 8.9–10.3)
CO2: 28 mmol/L (ref 22–32)
CREATININE: 1.05 mg/dL (ref 0.61–1.24)
Chloride: 102 mmol/L (ref 101–111)
GFR calc Af Amer: >60 mL/min (ref >60)
GLUCOSE: 140 mg/dL — AB (ref 65–99)
POTASSIUM: 3.9 mmol/L (ref 3.5–5.1)
Sodium: 138 mmol/L (ref 135–145)
TOTAL PROTEIN: 7.5 g/dL (ref 6.5–8.1)

## 2014-12-19 LAB — I-STAT TROPONIN, ED: Troponin i, poc: 0.01 ng/mL (ref 0.00–0.08)

## 2014-12-19 LAB — CBG MONITORING, ED: GLUCOSE-CAPILLARY: 92 mg/dL (ref 65–99)

## 2014-12-19 LAB — URIC ACID (CC13): Uric Acid, Serum: 5.9 mg/dl (ref 2.6–7.4)

## 2014-12-19 LAB — LACTATE DEHYDROGENASE (CC13): LDH: 220 U/L (ref 125–245)

## 2014-12-19 MED ORDER — SODIUM CHLORIDE 0.9 % IV BOLUS (SEPSIS)
500.0000 mL | Freq: Once | INTRAVENOUS | Status: AC
  Administered 2014-12-19: 500 mL via INTRAVENOUS

## 2014-12-19 MED ORDER — SODIUM CHLORIDE 0.9 % IV SOLN
Freq: Once | INTRAVENOUS | Status: AC
  Administered 2014-12-19: 20 mL/h via INTRAVENOUS

## 2014-12-19 NOTE — Progress Notes (Signed)
@   approx 2993 pt stated he thought he was going to faint. Pt progressed to a blank look and was unable to carryon a conversation. Noted his lower jaw to have tremors-did not have that when he first came back. VSS obtained, started on 02 @ 2l/min [place] and called for assistance and evaluation from Selena Lesser, NP. This episode lasted approx 15 minutes during which IV was started and pt receiving .9NS wide open. Labs from this am ok. Glucose 105.  Pt had difficulty speaking during this time and eyes closing, both arms with tremors.  After 15 minutes pt began to return to baseline status. Alert, oriented x3. VS returning to baseline. Denied chest pain but stated he felt SOB and had a headache during the episode. Discussed with Dr. Julien Nordmann and Selena Lesser, NP. Chemo to be held today and pt to be transferred to ED for evaluation. Chemo to be rescheduled to next week. Pt voices understanding. To go to [facility]

## 2014-12-19 NOTE — ED Notes (Addendum)
Provider Berniece Salines sending pt from cancer center. Pt currently being treated for nonhodgkins lymphoma, NO CHEMO, was suppose to start chemo today, but they did not. Pt reported shaking spells for a few weeks and has become dizzy with spells. Today pts BP at cancer center was 74-45H systolic. Pt had an episode where he was awake, but unable to speak, his lips and hands started shaking x45 seconds.   Berniece Salines has no reason to think pt has brain mets. Wants cardiac and stroke rule out.

## 2014-12-19 NOTE — ED Notes (Addendum)
Pt in the cancer center (Non-Hodgkin's Lymphoma) to receive first round of chemo. Per RN Patient began starring off, mouth tremors and bilateral extremity shaking. Event lasted approx 6 mins. Received 300 ml IV fluid, symptoms resolved. Pt denies LOC and is currently A/O x4. Initial BP 73/46. Now 142/92.

## 2014-12-19 NOTE — Telephone Encounter (Signed)
Gave patient avs report and appointments for September.  °

## 2014-12-19 NOTE — ED Notes (Signed)
MD at bedside. 

## 2014-12-19 NOTE — Progress Notes (Signed)
[facility] [phone]   [phone]  OFFICE PROGRESS NOTE  Kandice Hams, MD [address] [place] [address]  Principle Diagnosis: Hodgkin's lymphoma, initially diagnosed in 1979,recurrence in April 2011  Prior Therapy: Status post radiation 24 CT Y without systemic chemotherapy  Current therapy: Systemic chemotherapy with ABVD on days 1 and 15 every 4 weeks.  INTERVAL HISTORY: Jim Beck 81 y.o. male returns to the clinic today for follow-up visit. The patient is feeling fine today with no specific complaints except for the swelling in the left axilla. He recently underwent several studies including pulmonary function test as well as 2-D echo and CT-guided bone marrow biopsy and aspirate. The patient is here today for evaluation and discussion of these results as well as starting the first dose of his treatment. He denied having any significant fever or chills. He has no nausea or vomiting. The patient denied having any significant chest pain, shortness of breath, cough or hemoptysis. No significant weight loss or night sweats.   MEDICAL HISTORY: Past Medical History  Diagnosis Date  . COPD (chronic obstructive pulmonary disease)   . Tobacco abuse   . Hypertension   . Hyperlipemia   . GERD (gastroesophageal reflux disease)     large hiatal hernia and stricture  . Hx of blood diseases     bullous blood disease  . PUD (peptic ulcer disease)   . BPH (benign prostatic hypertrophy)   . History of alcohol abuse   . Heart murmur   . Diverticulosis   . External hemorrhoids   . Arthritis   . Myocardial infarction   . Substance abuse   . Personal history of colonic polyps - adenomas 11/30/2013    12 mm and diminutive adenoma - no recall planned (age)  . Shortness of breath dyspnea     with exertion  . Depression   . Constipation   . History of hiatal hernia     per notes  . Hodgkin lymphoma 07/2009    head and neck  and throat    ALLERGIES:  is allergic to aspirin.  MEDICATIONS:  Current Outpatient Prescriptions  Medication Sig Dispense Refill  . allopurinol (ZYLOPRIM) 100 MG tablet Take 1 tablet (100 mg total) by mouth 2 (two) times daily. 60 tablet 2  . amLODipine (NORVASC) 2.5 MG tablet Take 2.5 mg by mouth daily.  0  . amLODipine (NORVASC) 5 MG tablet Take 5 mg by mouth every morning.     Marland Kitchen aspirin EC 81 MG tablet Take 81 mg by mouth every morning.     Marland Kitchen HYDROcodone-acetaminophen (NORCO) 5-325 MG per tablet Take 1-2 tablets by mouth every 4 (four) hours as needed. (Patient not taking: Reported on 11/27/2014) 30 tablet 0  . magnesium hydroxide (MILK OF MAGNESIA) 400 MG/5ML suspension Take by mouth daily as needed for mild constipation.    . prochlorperazine (COMPAZINE) 10 MG tablet Take 1 tablet (10 mg total) by mouth every 6 (six) hours as needed for nausea or vomiting. 30 tablet 1  . rosuvastatin (CRESTOR) 10 MG tablet Take 10 mg by mouth every morning.      No current facility-administered medications for this visit.    SURGICAL HISTORY:  Past Surgical History  Procedure Laterality Date  . Inguinal hernia repair      bilateral  . Upper gastrointestinal endoscopy  03/08/2002    esophageal stricture, hiatal hernia  . Colonoscopy  03/08/2002    diverticulosis,  external hemorrhoids  . Lung surgery  2011  . Esophageal dilation      x2  . Throat cancer    . Lymph node biopsy Left 09/26/2014    Procedure: EXCISIONAL BIOPSY LEFT AXILLARY LYMPH NODE;  Surgeon: Coralie Keens, MD;  Location: [facility];  Service: General;  Laterality: Left;    REVIEW OF SYSTEMS:  Constitutional: positive for fatigue Eyes: negative Ears, nose, mouth, throat, and face: negative Respiratory: negative Cardiovascular: negative Gastrointestinal: negative Genitourinary:negative Integument/breast: negative Hematologic/lymphatic: negative Musculoskeletal:negative Neurological: negative Behavioral/Psych:  negative Endocrine: negative Allergic/Immunologic: negative   PHYSICAL EXAMINATION: General appearance: alert, cooperative, fatigued and no distress Head: Normocephalic, without obvious abnormality, atraumatic Neck: no adenopathy, no JVD, supple, symmetrical, trachea midline and thyroid not enlarged, symmetric, no tenderness/mass/nodules Lymph nodes: Axillary adenopathy: 2-3 cm palpable left axillary lymphadenopathy Resp: clear to auscultation bilaterally Back: symmetric, no curvature. ROM normal. No CVA tenderness. Cardio: regular rate and rhythm, S1, S2 normal, no murmur, click, rub or gallop GI: soft, non-tender; bowel sounds normal; no masses,  no organomegaly Extremities: extremities normal, atraumatic, no cyanosis or edema Neurologic: Alert and oriented X 3, normal strength and tone. Normal symmetric reflexes. Normal coordination and gait  ECOG PERFORMANCE STATUS: 1 - Symptomatic but completely ambulatory  Blood pressure 117/81, pulse 80, temperature 97.8 F (36.6 C), temperature source Oral, resp. rate 18, height _0  (1.803 m), weight 181 lb 6.4 oz (82.283 kg), SpO2 92 %.  LABORATORY DATA: Lab Results  Component Value Date   WBC 4.9 12/19/2014   HGB 10.9* 12/19/2014   HCT 35.4* 12/19/2014   MCV 80.3 12/19/2014   PLT 195 12/19/2014      Chemistry      Component Value Date/Time   NA 141 11/27/2014 0849   NA 138 09/21/2014 1521   K 4.2 11/27/2014 0849   K 4.4 09/21/2014 1521   CL 105 09/21/2014 1521   CO2 28 11/27/2014 0849   CO2 26 09/21/2014 1521   BUN 13.7 11/27/2014 0849   BUN 17 09/21/2014 1521   CREATININE 0.9 11/27/2014 0849   CREATININE 1.10 09/21/2014 1521      Component Value Date/Time   CALCIUM 9.1 11/27/2014 0849   CALCIUM 9.1 09/21/2014 1521   ALKPHOS 50 11/27/2014 0849   ALKPHOS 48 10/16/2013 1151   AST 15 11/27/2014 0849   AST 23 10/16/2013 1151   ALT 12 11/27/2014 0849   ALT 24 10/16/2013 1151   BILITOT 0.40 11/27/2014 0849   BILITOT 0.5  10/16/2013 1151       RADIOGRAPHIC STUDIES: Nm Pet Image Initial (pi) Skull Base To Thigh  11/20/2014   CLINICAL DATA:  Subsequent treatment strategy for Hodgkin's lymphoma.  EXAM: NUCLEAR MEDICINE PET SKULL BASE TO THIGH  TECHNIQUE: 9.05 mCi F-18 FDG was injected intravenously. Full-ring PET imaging was performed from the skull base to thigh after the radiotracer. CT data was obtained and used for attenuation correction and anatomic localization.  FASTING BLOOD GLUCOSE:  Value: 83 mg/dl  COMPARISON:  CT neck/ chest dated 08/27/2014. PET-CT dated 12/03/2009.  FINDINGS: NECK  No hypermetabolic lymph nodes in the neck.  Mild asymmetric hypermetabolism in the right tonsil, without correlate on recent enhanced CT, likely physiologic.  CHEST  8 x 6 mm nodule in the posterior right upper lobe (series 7/image 27), previously 7 x 6 mm in 2012, max SUV 0.8.  Underlying moderate centrilobular and paraseptal emphysematous changes. Left apical pleural parenchymal scarring. No pleural effusion or pneumothorax.  The heart is normal  in size.  No pericardial effusion.  Postsurgical seroma in the left axilla related to prior lymphadenectomy. Left axillary lymph nodes measuring up to 2.4 cm short axis (series 4/image 55), max SUV 13.2.  Additional prevascular nodes measuring up to 12 mm short axis (series 4/ image 63), max SUV 3.2. Small right paratracheal, AP window, and subcarinal nodes measuring 8-9 mm short axis, max SUV 2.9. Mild right hilar metabolism, poorly evaluated on unenhanced CT, max SUV 3.1.  ABDOMEN/PELVIS  No abnormal hypermetabolic activity within the liver, pancreas, adrenal glands, or spleen. Spleen is normal in size.  Moderate hiatal hernia/inverted intrathoracic stomach. Scarring/atrophy in the anterior right kidney. Atherosclerotic calcifications of the abdominal aorta and branch vessels. Right posterolateral bladder diverticulum. Small fat containing bilateral inguinal hernias.  No hypermetabolic lymph  nodes in the abdomen. 9 mm short axis right inguinal node (series 4/image 25), max SUV 4.7.  SKELETON  No focal hypermetabolic activity to suggest skeletal metastasis.  IMPRESSION: Postsurgical changes/seroma in the left axilla related to prior left axillary lymph node resection.  Hypermetabolic left axillary, mediastinal, and right hilar lymphadenopathy, compatible with recurrent lymphoma.  Additional 9 mm hypermetabolic right inguinal node, suspicious.  Spleen is normal in size.  8 x 6 mm posterior right upper lobe nodule, minimally increased from 2012, non FDG avid but beneath the size threshold for PET sensitivity. This is unlikely to be related to patient's lymphoma.   Electronically Signed   By: Julian Hy M.D.   On: 11/20/2014 18:03   Ct Biopsy  12/03/2014   INDICATION: History of lymphoma. Please perform CT-guided bone marrow biopsy for tissue diagnostic purposes.  EXAM: CT GUIDED BONE MARROW BIOPSY AND ASPIRATION  MEDICATIONS: Fentanyl 50 mcg IV; Versed 2 mg IV  ANESTHESIA/SEDATION: Sedation Time  8 minutes  CONTRAST:  None  COMPLICATIONS: None immediate.  PROCEDURE: Informed consent was obtained from the patient following an explanation of the procedure, risks, benefits and alternatives. The patient understands, agrees and consents for the procedure. All questions were addressed. A time out was performed prior to the initiation of the procedure. The patient was positioned prone and non-contrast localization CT was performed of the pelvis to demonstrate the iliac marrow spaces. The operative site was prepped and draped in the usual sterile fashion.  Under sterile conditions and local anesthesia, a 22 gauge spinal needle was utilized for procedural planning. Next, an 11 gauge coaxial bone biopsy needle was advanced into the left iliac marrow space. Needle position was confirmed with CT imaging. Initially, bone marrow aspiration was performed. Next, a bone marrow biopsy was obtained with the 11 gauge  outer bone marrow device. The 11 gauge coaxial bone biopsy needle was re-advanced into a slightly different location within the left iliac marrow space, positioning was confirmed and an additional bone marrow biopsy was obtained. Samples were prepared with the cytotechnologist and deemed adequate. The needle was removed intact. Hemostasis was obtained with compression and a dressing was placed. The patient tolerated the procedure well without immediate post procedural complication.  IMPRESSION: Successful CT guided left iliac bone marrow aspiration and core biopsies.   Electronically Signed   By: Sandi Mariscal M.D.   On: 12/03/2014 11:27   Patient: CHISUM, HABENICHT Collected: 12/03/2014 Client: [facility] Accession: [id number] Received: 12/03/2014 John "Ronny Bacon, MD DOB: 1932/08/07 Age: 47 Gender: M Reported: 12/05/2014 [address] Patient Ph: [phone] MRN #: [id number] [place], [place] [address] Visit #: [id number] Chart #: Phone: [phone] Fax: CC: Curt Bears, MD BONE  MARROW REPORT FINAL DIAGNOSIS Diagnosis Bone Marrow, Aspirate,Biopsy, and Clot, left iliac - HYPERCELLULAR BONE MARROW FOR AGE WITH TRILINEAGE HEMATOPOIESIS. - A FEW SMALL LYMPHOID AGGREGATES PRESENT. - SEE COMMENT. PERIPHERAL BLOOD: - NORMOCYTIC-NORMOCHROMIC ANEMIA. - MILD LEUKOPENIA. Diagnosis Note The bone marrow is hypercellular for age with trilineage hematopoiesis and non-specific myeloid changes. The clot sections in particular display a few small interstitial and relatively well-circumscribed lymphoid aggregates mostly composed of small lymphocytes. An admixed component of large atypical lymphoid cells is not appreciated. Flow cytometric analysis and immunoperoxidase stains fail to show any significant T or B-cell phenotypic abnormalities. In the setting of a previous history of nodular lymphocyte predominant Hodgkin lymphoma, the appearance is not considered specific or diagnostic of marrow  involvement by lymphoma. Clinical correlation is recommended. Susanne Greenhouse MD Pathologist, Electronic Signature (Case signed 12/05/2014)  ASSESSMENT AND PLAN: This is a very pleasant 81 years old African-American male with recurrent nodular lymphocyte predominant Hodgkin lymphoma presenting with large left axillary lymphadenopathy in addition to prevascular lymphadenopathy seen on the previous scan. He completed the evaluation before starting the chemotherapy. The bone marrow biopsy and aspirate showed no evidence for involvement with Hodgkin's lymphoma. His 2 D echo showed ejection fraction of 55-60 %.  I recommended for the patient to proceed with his systemic chemotherapy today with ABVD as scheduled. I reminded him with the adverse effect of this treatment including but not limited to alopecia, myelosuppression, nausea and vomiting, peripheral neuropathy, cardiac or pulmonary dysfunction. I would see the patient back for follow-up visit in 2 weeks for reevaluation and management of any adverse effect of his treatment.  He was advised to call immediately if he has any concerning symptoms in the interval. The patient voices understanding of current disease status and treatment options and is in agreement with the current care plan.  All questions were answered. The patient knows to call the clinic with any problems, questions or concerns. We can certainly see the patient much sooner if necessary.  I spent 15 minutes counseling the patient face to face. The total time spent in the appointment was 25 minutes.  Disclaimer: This note was dictated with voice recognition software. Similar sounding words can inadvertently be transcribed and may not be corrected upon review.

## 2014-12-19 NOTE — ED Provider Notes (Signed)
CSN: [id number]     Arrival date & time 12/19/14  1054 History   First MD Initiated Contact with Patient 12/19/14 1143     Chief Complaint  Patient presents with  . Weakness/Shaking   . Cancer Patient      (Consider location/radiation/quality/duration/timing/severity/associated sxs/prior Treatment) HPI..... Patient had vague complaints of weakness and shaking at the [facility] a brief time ago. He apparently was "staring off, mouth tremors, extremity shaking". Patient states he feels fine. No chest pain, dyspnea, cough, dysuria. He is being treated for Hodgkin's lymphoma. He is ambulatory.  Patient exhibited no postictal behavior the emergency department.  Past Medical History  Diagnosis Date  . COPD (chronic obstructive pulmonary disease)   . Tobacco abuse   . Hypertension   . Hyperlipemia   . GERD (gastroesophageal reflux disease)     large hiatal hernia and stricture  . Hx of blood diseases     bullous blood disease  . PUD (peptic ulcer disease)   . BPH (benign prostatic hypertrophy)   . History of alcohol abuse   . Heart murmur   . Diverticulosis   . External hemorrhoids   . Arthritis   . Myocardial infarction   . Substance abuse   . Personal history of colonic polyps - adenomas 11/30/2013    12 mm and diminutive adenoma - no recall planned (age)  . Shortness of breath dyspnea     with exertion  . Depression   . Constipation   . History of hiatal hernia     per notes  . Hodgkin lymphoma 07/2009    head and neck and throat   Past Surgical History  Procedure Laterality Date  . Inguinal hernia repair      bilateral  . Upper gastrointestinal endoscopy  03/08/2002    esophageal stricture, hiatal hernia  . Colonoscopy  03/08/2002    diverticulosis, external hemorrhoids  . Lung surgery  2011  . Esophageal dilation      x2  . Throat cancer    . Lymph node biopsy Left 09/26/2014    Procedure: EXCISIONAL BIOPSY LEFT AXILLARY LYMPH NODE;  Surgeon: Coralie Keens, MD;   Location: [place];  Service: General;  Laterality: Left;   Family History  Problem Relation Age of Onset  . Cancer Brother     abdominal  . Lung cancer Brother   . Throat cancer Sister   . Colon cancer Neg Hx   . Esophageal cancer Neg Hx   . Rectal cancer Neg Hx   . Stomach cancer Neg Hx    Social History  Substance Use Topics  . Smoking status: Former Research scientist (life sciences)  . Smokeless tobacco: Never Used  . Alcohol Use: No    Review of Systems  All other systems reviewed and are negative.     Allergies  Aspirin  Home Medications   Prior to Admission medications   Medication Sig Start Date End Date Taking? Authorizing Provider  allopurinol (ZYLOPRIM) 100 MG tablet Take 1 tablet (100 mg total) by mouth 2 (two) times daily. Patient taking differently: Take 100 mg by mouth daily.  11/27/14  Yes Curt Bears, MD  amLODipine (NORVASC) 2.5 MG tablet Take 2.5 mg by mouth daily. 12/12/14  Yes Historical Provider, MD  aspirin EC 81 MG tablet Take 81 mg by mouth every morning.    Yes Historical Provider, MD  rosuvastatin (CRESTOR) 10 MG tablet Take 10 mg by mouth every morning.    Yes Historical Provider, MD  HYDROcodone-acetaminophen [facility])  5-325 MG per tablet Take 1-2 tablets by mouth every 4 (four) hours as needed. Patient not taking: Reported on 11/27/2014 09/26/14   Coralie Keens, MD  prochlorperazine (COMPAZINE) 10 MG tablet Take 1 tablet (10 mg total) by mouth every 6 (six) hours as needed for nausea or vomiting. Patient not taking: Reported on 12/19/2014 11/27/14   Burnetta Sabin E Johnson, PA-C   BP 150/88 mmHg  Pulse 69  Temp(Src) 97.5 F (36.4 C) (Oral)  Resp 28  Ht 5\' 11"  (1.803 m)  Wt 181 lb 6 oz (82.271 kg)  BMI 25.31 kg/m2  SpO2 92% Physical Exam  Constitutional: He is oriented to person, place, and time. He appears well-developed and well-nourished.  HENT:  Head: Normocephalic and atraumatic.  Eyes: Conjunctivae and EOM are normal. Pupils are equal, round, and reactive to light.   Neck: Normal range of motion. Neck supple.  Cardiovascular: Normal rate and regular rhythm.   Pulmonary/Chest: Effort normal and breath sounds normal.  Abdominal: Soft. Bowel sounds are normal.  Musculoskeletal: Normal range of motion.  Neurological: He is alert and oriented to person, place, and time.  Skin: Skin is warm and dry.  Psychiatric: He has a normal mood and affect. His behavior is normal.  Nursing note and vitals reviewed.   ED Course  Procedures (including critical care time) Labs Review Labs Reviewed  CBC WITH DIFFERENTIAL/PLATELET - Abnormal; Notable for the following:    Hemoglobin 11.1 (*)    HCT 36.5 (*)    MCH 24.8 (*)    RDW 20.8 (*)    All other components within normal limits  COMPREHENSIVE METABOLIC PANEL - Abnormal; Notable for the following:    Glucose, Bld 140 (*)    ALT 14 (*)    All other components within normal limits  I-STAT CHEM 8, ED - Abnormal; Notable for the following:    Glucose, Bld 141 (*)    All other components within normal limits  URINALYSIS, ROUTINE W REFLEX MICROSCOPIC (NOT AT [facility])  CBG MONITORING, ED  I-STAT TROPOININ, ED    Imaging Review Dg Chest 2 View  12/19/2014   CLINICAL DATA:  Non-Hodgkin's lymphoma, onset of mild tremors, BILATERAL extremity shaking and altered mental status during first front chemotherapy, past history COPD, former smoker, hypertension, coronary artery disease post MI  EXAM: CHEST  2 VIEW  COMPARISON:  02/17/2012  FINDINGS: Minimal enlargement of cardiac silhouette.  Large hiatal hernia.  Mediastinal contours and pulmonary vascularity otherwise normal.  Numerous EKG leads project over chest particularly on LEFT.  Lungs grossly clear.  Underlying emphysematous changes.  No infiltrate, pleural effusion, or pneumothorax.  Staple line at LEFT apex post resection.  No acute osseous findings.  Chronic height loss of a vertebra at the thoracolumbar junction.  IMPRESSION: Minimal enlargement of cardiac silhouette.   Large hiatal hernia.  COPD changes without acute abnormalities.   Electronically Signed   By: Lavonia Dana M.D.   On: 12/19/2014 13:26   Ct Head Wo Contrast  12/19/2014   CLINICAL DATA:  Onset of altered mental status during chemotherapy, mild tremors, shaking in extremities, weakness, symptoms lasting 6 minutes, now resolved, history non-Hodgkin's lymphoma, COPD, smoking, hypertension, coronary disease post MI  EXAM: CT HEAD WITHOUT CONTRAST  TECHNIQUE: Contiguous axial images were obtained from the base of the skull through the vertex without intravenous contrast.  COMPARISON:  None  FINDINGS: Generalized atrophy.  Normal ventricular morphology.  No midline shift or mass effect.  Small vessel chronic ischemic changes of  deep cerebral white matter.  No intracranial hemorrhage, mass lesion, or acute infarction.  No extra-axial fluid collections.  Small BILATERAL basal ganglia calcifications.  Visualized paranasal sinuses and mastoid air cells clear.  Bones unremarkable.  IMPRESSION: Atrophy with small vessel chronic ischemic changes of deep cerebral white matter.  No acute intracranial abnormalities.  If patient has persistent or recurrent seizures, recommend follow-up MR brain with and without contrast to further assess.   Electronically Signed   By: Lavonia Dana M.D.   On: 12/19/2014 13:22   I have personally reviewed and evaluated these images and lab results as part of my medical decision-making.   EKG Interpretation   Date/Time:  Wednesday December 19 2014 11:22:30 EDT Ventricular Rate:  65 PR Interval:  175 QRS Duration: 139 QT Interval:  458 QTC Calculation: 476 R Axis:   28 Text Interpretation:  Sinus rhythm Right bundle branch block ED PHYSICIAN  INTERPRETATION AVAILABLE IN [facility] Confirmed by TEST, Record  ([id number]) on 12/20/2014 8:34:19 AM      Date: 12/19/2014  Rate: 65  Rhythm: normal sinus rhythm  QRS Axis: normal  Intervals: normal  ST/T Wave abnormalities: normal   Conduction Disutrbances: RBBB  Narrative Interpretation: unremarkable    MDM   Final diagnoses:  Weakness    Event could've been a seizure, but patient did not exhibit any postictal behavior in the emergency department. CT head was negative for acute findings. Glucose was stable. EKG shows normal sinus rhythm. Patient was completely symptom-free in the ED.    Nat Christen, MD 12/20/14 1725

## 2014-12-19 NOTE — ED Notes (Signed)
Bed: WA14 Expected date:  Expected time:  Means of arrival:  Comments: 

## 2014-12-19 NOTE — ED Notes (Signed)
Bed: RESB Expected date:  Expected time:  Means of arrival:  Comments: Cancer pt

## 2014-12-20 ENCOUNTER — Other Ambulatory Visit: Payer: Self-pay | Admitting: Medical Oncology

## 2014-12-20 ENCOUNTER — Other Ambulatory Visit: Payer: Self-pay | Admitting: *Deleted

## 2014-12-20 ENCOUNTER — Encounter (HOSPITAL_COMMUNITY): Payer: Self-pay

## 2014-12-20 DIAGNOSIS — I878 Other specified disorders of veins: Secondary | ICD-10-CM

## 2014-12-20 DIAGNOSIS — C8190 Hodgkin lymphoma, unspecified, unspecified site: Secondary | ICD-10-CM

## 2014-12-20 DIAGNOSIS — C8590 Non-Hodgkin lymphoma, unspecified, unspecified site: Secondary | ICD-10-CM

## 2014-12-20 NOTE — Progress Notes (Signed)
PAC order complete

## 2014-12-28 ENCOUNTER — Other Ambulatory Visit: Payer: Self-pay | Admitting: Radiology

## 2015-01-01 ENCOUNTER — Other Ambulatory Visit: Payer: Self-pay | Admitting: Internal Medicine

## 2015-01-01 ENCOUNTER — Encounter (HOSPITAL_COMMUNITY): Payer: Self-pay

## 2015-01-01 ENCOUNTER — Ambulatory Visit (HOSPITAL_COMMUNITY)
Admission: RE | Admit: 2015-01-01 | Discharge: 2015-01-01 | Disposition: A | Discharge status: Home / Self Care | Payer: Medicare Other | Source: Ambulatory Visit | Attending: Internal Medicine | Admitting: Internal Medicine

## 2015-01-01 DIAGNOSIS — Z7982 Long term (current) use of aspirin: Secondary | ICD-10-CM | POA: Insufficient documentation

## 2015-01-01 DIAGNOSIS — C8190 Hodgkin lymphoma, unspecified, unspecified site: Secondary | ICD-10-CM | POA: Insufficient documentation

## 2015-01-01 DIAGNOSIS — I878 Other specified disorders of veins: Secondary | ICD-10-CM

## 2015-01-01 LAB — CBC
HCT: 34.8 % — ABNORMAL LOW (ref 39.0–52.0)
HEMOGLOBIN: 10.5 g/dL — AB (ref 13.0–17.0)
MCH: 24.9 pg — ABNORMAL LOW (ref 26.0–34.0)
MCHC: 30.2 g/dL (ref 30.0–36.0)
MCV: 82.7 fL (ref 78.0–100.0)
Platelets: 176 10*3/uL (ref 150–400)
RBC: 4.21 MIL/uL — AB (ref 4.22–5.81)
RDW: 20.5 % — ABNORMAL HIGH (ref 11.5–15.5)
WBC: 4.2 10*3/uL (ref 4.0–10.5)

## 2015-01-01 LAB — PROTIME-INR
INR: 1.15 (ref 0.00–1.49)
PROTHROMBIN TIME: 14.8 s (ref 11.6–15.2)

## 2015-01-01 LAB — BASIC METABOLIC PANEL
ANION GAP: 4 — AB (ref 5–15)
BUN: 13 mg/dL (ref 6–20)
CALCIUM: 9 mg/dL (ref 8.9–10.3)
CO2: 28 mmol/L (ref 22–32)
Chloride: 107 mmol/L (ref 101–111)
Creatinine, Ser: 0.91 mg/dL (ref 0.61–1.24)
Glucose, Bld: 91 mg/dL (ref 65–99)
Potassium: 3.6 mmol/L (ref 3.5–5.1)
SODIUM: 139 mmol/L (ref 135–145)

## 2015-01-01 LAB — APTT: aPTT: 31 seconds (ref 24–37)

## 2015-01-01 MED ORDER — FENTANYL CITRATE (PF) 100 MCG/2ML IJ SOLN
INTRAMUSCULAR | Status: AC
  Filled 2015-01-01: qty 4

## 2015-01-01 MED ORDER — HEPARIN SOD (PORK) LOCK FLUSH 100 UNIT/ML IV SOLN
INTRAVENOUS | Status: AC | PRN
  Administered 2015-01-01: 500 [IU]

## 2015-01-01 MED ORDER — SODIUM CHLORIDE 0.9 % IV SOLN
Freq: Once | INTRAVENOUS | Status: AC
  Administered 2015-01-01: 10:00:00 via INTRAVENOUS

## 2015-01-01 MED ORDER — LIDOCAINE HCL 1 % IJ SOLN
INTRAMUSCULAR | Status: AC
  Filled 2015-01-01: qty 20

## 2015-01-01 MED ORDER — CEFAZOLIN SODIUM-DEXTROSE 2-3 GM-% IV SOLR
INTRAVENOUS | Status: AC
  Filled 2015-01-01: qty 50

## 2015-01-01 MED ORDER — FENTANYL CITRATE (PF) 100 MCG/2ML IJ SOLN
INTRAMUSCULAR | Status: AC | PRN
  Administered 2015-01-01: 50 ug via INTRAVENOUS

## 2015-01-01 MED ORDER — MIDAZOLAM HCL 2 MG/2ML IJ SOLN
INTRAMUSCULAR | Status: AC | PRN
  Administered 2015-01-01: 1 mg via INTRAVENOUS
  Administered 2015-01-01 (×3): 0.5 mg via INTRAVENOUS

## 2015-01-01 MED ORDER — MIDAZOLAM HCL 2 MG/2ML IJ SOLN
INTRAMUSCULAR | Status: AC
  Filled 2015-01-01: qty 6

## 2015-01-01 MED ORDER — CEFAZOLIN SODIUM-DEXTROSE 2-3 GM-% IV SOLR
2.0000 g | Freq: Once | INTRAVENOUS | Status: AC
  Administered 2015-01-01: 2 g via INTRAVENOUS

## 2015-01-01 MED ORDER — HEPARIN SOD (PORK) LOCK FLUSH 100 UNIT/ML IV SOLN
INTRAVENOUS | Status: AC
  Filled 2015-01-01: qty 5

## 2015-01-01 MED ORDER — LIDOCAINE-EPINEPHRINE 2 %-1:100000 IJ SOLN
INTRAMUSCULAR | Status: AC
  Filled 2015-01-01: qty 1

## 2015-01-01 NOTE — Discharge Instructions (Signed)
Implanted Port Insertion, Care After °Refer to this sheet in the next few weeks. These instructions provide you with information on caring for yourself after your procedure. Your health care provider may also give you more specific instructions. Your treatment has been planned according to current medical practices, but problems sometimes occur. Call your health care provider if you have any problems or questions after your procedure. °WHAT TO EXPECT AFTER THE PROCEDURE °After your procedure, it is typical to have the following:  °· Discomfort at the port insertion site. Ice packs to the area will help. °· Bruising on the skin over the port. This will subside in 3-4 days. °HOME CARE INSTRUCTIONS °· After your port is placed, you will get a manufacturer's information card. The card has information about your port. Keep this card with you at all times.   °· Know what kind of port you have. There are many types of ports available.   °· Wear a medical alert bracelet in case of an emergency. This can help alert health care workers that you have a port.   °· The port can stay in for as [place] as your health care provider believes it is necessary.   °· A home health care nurse may give medicines and take care of the port.   °· You or a family member can get special training and directions for giving medicine and taking care of the port at home.   °SEEK MEDICAL CARE IF:  °· Your port does not flush or you are unable to get a blood return.   °· You have a fever or chills. °SEEK IMMEDIATE MEDICAL CARE IF: °· You have new fluid or pus coming from your incision.   °· You notice a bad smell coming from your incision site.   °· You have swelling, pain, or more redness at the incision or port site.   °· You have chest pain or shortness of breath. °Document Released: 02/01/2013 Document Revised: 04/18/2013 Document Reviewed: 02/01/2013 °ExitCare® Patient Information ©2015 [employer]. This information is not intended to replace  advice given to you by your health care provider. Make sure you discuss any questions you have with your health care provider. °Implanted Port Home Guide °An implanted port is a type of central line that is placed under the skin. Central lines are used to provide IV access when treatment or nutrition needs to be given through a person's veins. Implanted ports are used for Domke-term IV access. An implanted port may be placed because:  °· You need IV medicine that would be irritating to the small veins in your hands or arms.   °· You need Terlecki-term IV medicines, such as antibiotics.   °· You need IV nutrition for a Fifita period.   °· You need frequent blood draws for lab tests.   °· You need dialysis.   °Implanted ports are usually placed in the chest area, but they can also be placed in the upper arm, the abdomen, or the leg. An implanted port has two main parts:  °· Reservoir. The reservoir is round and will appear as a small, raised area under your skin. The reservoir is the part where a needle is inserted to give medicines or draw blood.   °· Catheter. The catheter is a thin, flexible tube that extends from the reservoir. The catheter is placed into a large vein. Medicine that is inserted into the reservoir goes into the catheter and then into the vein.   °HOW WILL I CARE FOR MY INCISION SITE? °Do not get the incision site wet. Bathe or   shower as directed by your health care provider.  °HOW IS MY PORT ACCESSED? °Special steps must be taken to access the port:  °· Before the port is accessed, a numbing cream can be placed on the skin. This helps numb the skin over the port site.   °· Your health care provider uses a sterile technique to access the port. °· Your health care provider must put on a mask and sterile gloves. °· The skin over your port is cleaned carefully with an antiseptic and allowed to dry. °· The port is gently pinched between sterile gloves, and a needle is inserted into the port. °· Only  "non-coring" port needles should be used to access the port. Once the port is accessed, a blood return should be checked. This helps ensure that the port is in the vein and is not clogged.   °· If your port needs to remain accessed for a constant infusion, a clear (transparent) bandage will be placed over the needle site. The bandage and needle will need to be changed every week, or as directed by your health care provider.   °· Keep the bandage covering the needle clean and dry. Do not get it wet. Follow your health care provider's instructions on how to take a shower or bath while the port is accessed.   °· If your port does not need to stay accessed, no bandage is needed over the port.   °WHAT IS FLUSHING? °Flushing helps keep the port from getting clogged. Follow your health care provider's instructions on how and when to flush the port. Ports are usually flushed with saline solution or a medicine called heparin. The need for flushing will depend on how the port is used.  °· If the port is used for intermittent medicines or blood draws, the port will need to be flushed:   °· After medicines have been given.   °· After blood has been drawn.   °· As part of routine maintenance.   °· If a constant infusion is running, the port may not need to be flushed.   °HOW Brasel WILL MY PORT STAY IMPLANTED? °The port can stay in for as [place] as your health care provider thinks it is needed. When it is time for the port to come out, surgery will be done to remove it. The procedure is similar to the one performed when the port was put in.  °WHEN SHOULD I SEEK IMMEDIATE MEDICAL CARE? °When you have an implanted port, you should seek immediate medical care if:  °· You notice a bad smell coming from the incision site.   °· You have swelling, redness, or drainage at the incision site.   °· You have more swelling or pain at the port site or the surrounding area.   °· You have a fever that is not controlled with medicine. °Document  Released: 04/13/2005 Document Revised: 02/01/2013 Document Reviewed: 12/19/2012 °ExitCare® Patient Information ©2015 [employer]. This information is not intended to replace advice given to you by your health care provider. Make sure you discuss any questions you have with your health care provider. °Conscious Sedation °Sedation is the use of medicines to promote relaxation and relieve discomfort and anxiety. Conscious sedation is a type of sedation. Under conscious sedation you are less alert than normal but are still able to respond to instructions or stimulation. Conscious sedation is used during short medical and dental procedures. It is milder than deep sedation or general anesthesia and allows you to return to your regular activities sooner.  °LET YOUR HEALTH CARE PROVIDER   KNOW ABOUT:  °· Any allergies you have. °· All medicines you are taking, including vitamins, herbs, eye drops, creams, and over-the-counter medicines. °· Use of steroids (by mouth or creams). °· Previous problems you or members of your family have had with the use of anesthetics. °· Any blood disorders you have. °· Previous surgeries you have had. °· Medical conditions you have. °· Possibility of pregnancy, if this applies. °· Use of cigarettes, alcohol, or illegal drugs. °RISKS AND COMPLICATIONS °Generally, this is a safe procedure. However, as with any procedure, problems can occur. Possible problems include: °· Oversedation. °· Trouble breathing on your own. You may need to have a breathing tube until you are awake and breathing on your own. °· Allergic reaction to any of the medicines used for the procedure. °BEFORE THE PROCEDURE °· You may have blood tests done. These tests can help show how well your kidneys and liver are working. They can also show how well your blood clots. °· A physical exam will be done.   °· Only take medicines as directed by your health care provider. You may need to stop taking medicines (such as blood  thinners, aspirin, or nonsteroidal anti-inflammatory drugs) before the procedure.   °· Do not eat or drink at least 6 hours before the procedure or as directed by your health care provider. °· Arrange for a responsible adult, family member, or friend to take you home after the procedure. He or she should stay with you for at least 24 hours after the procedure, until the medicine has worn off. °PROCEDURE  °· An intravenous (IV) catheter will be inserted into one of your veins. Medicine will be able to flow directly into your body through this catheter. You may be given medicine through this tube to help prevent pain and help you relax. °· The medical or dental procedure will be done. °AFTER THE PROCEDURE °· You will stay in a recovery area until the medicine has worn off. Your blood pressure and pulse will be checked.   °·  Depending on the procedure you had, you may be allowed to go home when you can tolerate liquids and your pain is under control. °Document Released: 01/06/2001 Document Revised: 04/18/2013 Document Reviewed: 12/19/2012 °ExitCare® Patient Information ©2015 [employer]. This information is not intended to replace advice given to you by your health care provider. Make sure you discuss any questions you have with your health care provider. °Conscious Sedation, Adult, Care After °Refer to this sheet in the next few weeks. These instructions provide you with information on caring for yourself after your procedure. Your health care provider may also give you more specific instructions. Your treatment has been planned according to current medical practices, but problems sometimes occur. Call your health care provider if you have any problems or questions after your procedure. °WHAT TO EXPECT AFTER THE PROCEDURE  °After your procedure: °· You may feel sleepy, clumsy, and have poor balance for several hours. °· Vomiting may occur if you eat too soon after the procedure. °HOME CARE INSTRUCTIONS °· Do not  participate in any activities where you could become injured for at least 24 hours. Do not: °¨ Drive. °¨ Swim. °¨ Ride a bicycle. °¨ Operate heavy machinery. °¨ Cook. °¨ Use power tools. °¨ Climb ladders. °¨ Work from a high place. °· Do not make important decisions or sign legal documents until you are improved. °· If you vomit, drink water, juice, or soup when you can drink without vomiting. Make sure you have little   or no nausea before eating solid foods. °· Only take over-the-counter or prescription medicines for pain, discomfort, or fever as directed by your health care provider. °· Make sure you and your family fully understand everything about the medicines given to you, including what side effects may occur. °· You should not drink alcohol, take sleeping pills, or take medicines that cause drowsiness for at least 24 hours. °· If you smoke, do not smoke without supervision. °· If you are feeling better, you may resume normal activities 24 hours after you were sedated. °· Keep all appointments with your health care provider. °SEEK MEDICAL CARE IF: °· Your skin is pale or bluish in color. °· You continue to feel nauseous or vomit. °· Your pain is getting worse and is not helped by medicine. °· You have bleeding or swelling. °· You are still sleepy or feeling clumsy after 24 hours. °SEEK IMMEDIATE MEDICAL CARE IF: °· You develop a rash. °· You have difficulty breathing. °· You develop any type of allergic problem. °· You have a fever. °MAKE SURE YOU: °· Understand these instructions. °· Will watch your condition. °· Will get help right away if you are not doing well or get worse. °Document Released: 02/01/2013 Document Reviewed: 02/01/2013 °ExitCare® Patient Information ©2015 [employer]. This information is not intended to replace advice given to you by your health care provider. Make sure you discuss any questions you have with your health care provider. ° °

## 2015-01-01 NOTE — Procedures (Signed)
Interventional Radiology Procedure Note  Procedure: Placement of a right IJ approach single lumen PowerPort.  Tip is positioned at the superior cavoatrial junction and catheter is ready for immediate use.  Complications: No immediate Recommendations:  - Ok to shower tomorrow - Routine line care   Faryal Marxen T. Taten Merrow, M.D Pager:  [phone]   

## 2015-01-01 NOTE — H&P (Signed)
HPI: Patient with recurrent Hodgkin Lymphoma seen by Dr. Julien Nordmann on 12/19/14 and scheduled today for image guided port a catheter placement.  The patient has had a H&P performed within the last 30 days, all history, medications, and exam have been reviewed. The patient denies any interval changes since the H&P.  Medications: Prior to Admission medications   Medication Sig Start Date End Date Taking? Authorizing Provider  allopurinol (ZYLOPRIM) 100 MG tablet Take 1 tablet (100 mg total) by mouth 2 (two) times daily. Patient taking differently: Take 100 mg by mouth daily.  11/27/14  Yes Curt Bears, MD  amLODipine (NORVASC) 2.5 MG tablet Take 2.5 mg by mouth every morning.  12/12/14  Yes Historical Provider, MD  aspirin EC 81 MG tablet Take 81 mg by mouth every morning.    Yes Historical Provider, MD  prochlorperazine (COMPAZINE) 10 MG tablet Take 1 tablet (10 mg total) by mouth every 6 (six) hours as needed for nausea or vomiting. 11/27/14  Yes Carlton Adam, PA-C  rosuvastatin (CRESTOR) 10 MG tablet Take 10 mg by mouth every morning.    Yes Historical Provider, MD  HYDROcodone-acetaminophen (NORCO) 5-325 MG per tablet Take 1-2 tablets by mouth every 4 (four) hours as needed. Patient not taking: Reported on 11/27/2014 09/26/14   Coralie Keens, MD    Vital Signs: BP 165/102 mmHg  Pulse 73  Temp(Src) 97.5 F (36.4 C) (Oral)  Resp 28  SpO2 96%  Physical Exam  Constitutional: He is oriented to person, place, and time. No distress.  HENT:  Head: Normocephalic and atraumatic.  Cardiovascular: Normal rate and regular rhythm.  Exam reveals no gallop and no friction rub.   No murmur heard. Pulmonary/Chest: Effort normal and breath sounds normal. No respiratory distress. He has no wheezes. He has no rales.  Neurological: He is alert and oriented to person, place, and time.  Skin: He is not diaphoretic.    Mallampati Score:  MD Evaluation Airway: WNL Heart: WNL Abdomen: WNL Chest/  Lungs: WNL ASA  Classification: 3 Mallampati/Airway Score: Two  Labs:  CBC:  Recent Labs  12/03/14 0704 12/19/14 0845 12/19/14 1142 12/19/14 1303 01/01/15 1000  WBC 3.9* 4.9  --  4.2 4.2  HGB 10.7* 10.9* 14.3 11.1* 10.5*  HCT 34.6* 35.4* 42.0 36.5* 34.8*  PLT 179 195  --  198 176    COAGS:  Recent Labs  12/03/14 0704  INR 1.10  APTT 30    BMP:  Recent Labs  09/21/14 1521 11/12/14 1342 11/27/14 0849 12/19/14 0845 12/19/14 1142 12/19/14 1303  NA 138 142 141 140 141 138  K 4.4 4.3 4.2 4.2 3.9 3.9  CL 105  --   --   --  102 102  CO2 26 26 28 25   --  28  GLUCOSE 88 85 93 105 141* 140*  BUN 17 16.4 13.7 11.8 13 12   CALCIUM 9.1 9.4 9.1 9.5  --  9.3  CREATININE 1.10 1.0 0.9 1.1 1.00 1.05  GFRNONAA >60  --   --   --   --  >60  GFRAA >60  --   --   --   --  >60    LIVER FUNCTION TESTS:  Recent Labs  11/12/14 1342 11/27/14 0849 12/19/14 0845 12/19/14 1303  BILITOT 0.27 0.40 0.51 0.4  AST 13 15 15 20   ALT 11 12 14  14*  ALKPHOS 46 50 55 50  PROT 6.9 6.8 6.7 7.5  ALBUMIN 3.6 3.6 3.6 4.0  Assessment/Plan:  Recurrent Hodgkin Lymphoma Seen by Dr. Julien Nordmann 12/19/14 Scheduled today for image guided port a catheter placement with sedation The patient has been NPO, no blood thinners taken, labs and vitals have been reviewed. Risks and Benefits discussed with the patient including, but not limited to bleeding, infection, pneumothorax, or fibrin sheath development and need for additional procedures. All of the patient's questions were answered, patient is agreeable to proceed. Consent signed and in chart.    SignedHedy Jacob 01/01/2015, 10:43 AM

## 2015-01-02 ENCOUNTER — Encounter: Payer: Self-pay | Admitting: Physician Assistant

## 2015-01-02 ENCOUNTER — Ambulatory Visit (HOSPITAL_BASED_OUTPATIENT_CLINIC_OR_DEPARTMENT_OTHER): Payer: Medicare Other | Admitting: Physician Assistant

## 2015-01-02 ENCOUNTER — Other Ambulatory Visit (HOSPITAL_BASED_OUTPATIENT_CLINIC_OR_DEPARTMENT_OTHER): Payer: Medicare Other

## 2015-01-02 ENCOUNTER — Ambulatory Visit (HOSPITAL_BASED_OUTPATIENT_CLINIC_OR_DEPARTMENT_OTHER): Payer: Medicare Other

## 2015-01-02 VITALS — BP 131/80 | HR 90 | Temp 98.5°F | Resp 18 | Ht 71.0 in | Wt 185.7 lb

## 2015-01-02 DIAGNOSIS — Z5111 Encounter for antineoplastic chemotherapy: Secondary | ICD-10-CM | POA: Diagnosis not present

## 2015-01-02 DIAGNOSIS — C8194 Hodgkin lymphoma, unspecified, lymph nodes of axilla and upper limb: Secondary | ICD-10-CM | POA: Diagnosis not present

## 2015-01-02 DIAGNOSIS — C8190 Hodgkin lymphoma, unspecified, unspecified site: Secondary | ICD-10-CM

## 2015-01-02 DIAGNOSIS — C8590 Non-Hodgkin lymphoma, unspecified, unspecified site: Secondary | ICD-10-CM

## 2015-01-02 LAB — CBC WITH DIFFERENTIAL/PLATELET
BASO%: 0.4 % (ref 0.0–2.0)
Basophils Absolute: 0 10*3/uL (ref 0.0–0.1)
EOS%: 1.2 % (ref 0.0–7.0)
Eosinophils Absolute: 0.1 10*3/uL (ref 0.0–0.5)
HCT: 33.9 % — ABNORMAL LOW (ref 38.4–49.9)
HGB: 10.3 g/dL — ABNORMAL LOW (ref 13.0–17.1)
LYMPH%: 28.5 % (ref 14.0–49.0)
MCH: 24.5 pg — ABNORMAL LOW (ref 27.2–33.4)
MCHC: 30.4 g/dL — AB (ref 32.0–36.0)
MCV: 80.7 fL (ref 79.3–98.0)
MONO#: 0.7 10*3/uL (ref 0.1–0.9)
MONO%: 13.3 % (ref 0.0–14.0)
NEUT%: 56.6 % (ref 39.0–75.0)
NEUTROS ABS: 3 10*3/uL (ref 1.5–6.5)
Platelets: 159 10*3/uL (ref 140–400)
RBC: 4.21 10*6/uL (ref 4.20–5.82)
RDW: 21.8 % — ABNORMAL HIGH (ref 11.0–14.6)
WBC: 5.3 10*3/uL (ref 4.0–10.3)
lymph#: 1.5 10*3/uL (ref 0.9–3.3)

## 2015-01-02 LAB — COMPREHENSIVE METABOLIC PANEL (CC13)
ALT: 12 U/L (ref 0–55)
AST: 14 U/L (ref 5–34)
Albumin: 3.3 g/dL — ABNORMAL LOW (ref 3.5–5.0)
Alkaline Phosphatase: 49 U/L (ref 40–150)
Anion Gap: 5 mEq/L (ref 3–11)
BILIRUBIN TOTAL: 0.26 mg/dL (ref 0.20–1.20)
BUN: 16.6 mg/dL (ref 7.0–26.0)
CHLORIDE: 110 meq/L — AB (ref 98–109)
CO2: 28 meq/L (ref 22–29)
CREATININE: 1 mg/dL (ref 0.7–1.3)
Calcium: 9.2 mg/dL (ref 8.4–10.4)
EGFR: 83 mL/min/{1.73_m2} — ABNORMAL LOW (ref >90)
GLUCOSE: 99 mg/dL (ref 70–140)
Potassium: 3.9 mEq/L (ref 3.5–5.1)
SODIUM: 143 meq/L (ref 136–145)
TOTAL PROTEIN: 6.5 g/dL (ref 6.4–8.3)

## 2015-01-02 LAB — URIC ACID (CC13): Uric Acid, Serum: 4 mg/dl (ref 2.6–7.4)

## 2015-01-02 MED ORDER — SODIUM CHLORIDE 0.9 % IJ SOLN
10.0000 mL | INTRAMUSCULAR | Status: DC | PRN
Start: 2015-01-02 — End: 2015-01-02
  Administered 2015-01-02: 10 mL
  Filled 2015-01-02: qty 10

## 2015-01-02 MED ORDER — SODIUM CHLORIDE 0.9 % IV SOLN
Freq: Once | INTRAVENOUS | Status: AC
  Administered 2015-01-02: 12:00:00 via INTRAVENOUS
  Filled 2015-01-02: qty 8

## 2015-01-02 MED ORDER — DOXORUBICIN HCL CHEMO IV INJECTION 2 MG/ML
25.0000 mg/m2 | Freq: Once | INTRAVENOUS | Status: AC
  Administered 2015-01-02: 50 mg via INTRAVENOUS
  Filled 2015-01-02: qty 25

## 2015-01-02 MED ORDER — SODIUM CHLORIDE 0.9 % IV SOLN
375.0000 mg/m2 | Freq: Once | INTRAVENOUS | Status: AC
  Administered 2015-01-02: 760 mg via INTRAVENOUS
  Filled 2015-01-02: qty 38

## 2015-01-02 MED ORDER — VINBLASTINE SULFATE CHEMO INJECTION 1 MG/ML
5.9000 mg/m2 | Freq: Once | INTRAVENOUS | Status: AC
  Administered 2015-01-02: 12 mg via INTRAVENOUS
  Filled 2015-01-02: qty 12

## 2015-01-02 MED ORDER — SODIUM CHLORIDE 0.9 % IV SOLN
Freq: Once | INTRAVENOUS | Status: AC
  Administered 2015-01-02: 12:00:00 via INTRAVENOUS

## 2015-01-02 MED ORDER — HEPARIN SOD (PORK) LOCK FLUSH 100 UNIT/ML IV SOLN
500.0000 [IU] | Freq: Once | INTRAVENOUS | Status: AC | PRN
  Administered 2015-01-02: 500 [IU]
  Filled 2015-01-02: qty 5

## 2015-01-02 MED ORDER — LIDOCAINE-PRILOCAINE 2.5-2.5 % EX CREA: 1.0000 "application " | TOPICAL_CREAM | CUTANEOUS | Status: AC | PRN

## 2015-01-02 MED ORDER — SODIUM CHLORIDE 0.9 % IV SOLN
10.0000 [IU]/m2 | Freq: Once | INTRAVENOUS | Status: AC
  Administered 2015-01-02: 20 [IU] via INTRAVENOUS
  Filled 2015-01-02: qty 6.67

## 2015-01-02 NOTE — Progress Notes (Addendum)
[facility] [phone]   [phone]  OFFICE PROGRESS NOTE  Kandice Hams, MD [address] [place] [address]  Principle Diagnosis: Hodgkin's lymphoma, initially diagnosed in 1979,recurrence in April 2011  Prior Therapy: Status post radiation 78 CT Y without systemic chemotherapy  Current therapy: Systemic chemotherapy with ABVD on days 1 and 15 every 4 weeks.  INTERVAL HISTORY: Jim Beck 81 y.o. male returns to the clinic today for follow-up visit. He is status post Port-A-Cath placement on 01/01/2016. He presents to start his first cycle of systemic chemotherapy with ABVD given on days 1 and 15 every 4 weeks. He currently does not have a prescription for Emla cream. The patient is feeling fine today with no specific complaints.  He denied having any significant fever or chills. He has no nausea or vomiting. The patient denied having any significant chest pain, shortness of breath, cough or hemoptysis. No significant weight loss or night sweats.   MEDICAL HISTORY: Past Medical History  Diagnosis Date  . COPD (chronic obstructive pulmonary disease)   . Tobacco abuse   . Hypertension   . Hyperlipemia   . GERD (gastroesophageal reflux disease)     large hiatal hernia and stricture  . Hx of blood diseases     bullous blood disease  . PUD (peptic ulcer disease)   . BPH (benign prostatic hypertrophy)   . History of alcohol abuse   . Heart murmur   . Diverticulosis   . External hemorrhoids   . Arthritis   . Myocardial infarction   . Substance abuse   . Personal history of colonic polyps - adenomas 11/30/2013    12 mm and diminutive adenoma - no recall planned (age)  . Shortness of breath dyspnea     with exertion  . Depression   . Constipation   . History of hiatal hernia     per notes  . Hodgkin lymphoma 07/2009    head and neck and throat    ALLERGIES:  is allergic to aspirin.  MEDICATIONS:  Current  Outpatient Prescriptions  Medication Sig Dispense Refill  . allopurinol (ZYLOPRIM) 100 MG tablet Take 1 tablet (100 mg total) by mouth 2 (two) times daily. (Patient taking differently: Take 100 mg by mouth daily. ) 60 tablet 2  . amLODipine (NORVASC) 2.5 MG tablet Take 2.5 mg by mouth every morning.   0  . aspirin EC 81 MG tablet Take 81 mg by mouth every morning.     Marland Kitchen HYDROcodone-acetaminophen (NORCO) 5-325 MG per tablet Take 1-2 tablets by mouth every 4 (four) hours as needed. (Patient not taking: Reported on 11/27/2014) 30 tablet 0  . lidocaine-prilocaine (EMLA) cream Apply 1 application topically as needed. 30 g 1  . prochlorperazine (COMPAZINE) 10 MG tablet Take 1 tablet (10 mg total) by mouth every 6 (six) hours as needed for nausea or vomiting. 30 tablet 1  . rosuvastatin (CRESTOR) 10 MG tablet Take 10 mg by mouth every morning.      No current facility-administered medications for this visit.   Facility-Administered Medications Ordered in Other Visits  Medication Dose Route Frequency Provider Last Rate Last Dose  . sodium chloride 0.9 % injection 10 mL  10 mL Intracatheter PRN Curt Bears, MD   10 mL at 01/02/15 1505    SURGICAL HISTORY:  Past Surgical History  Procedure Laterality Date  . Inguinal hernia repair      bilateral  . Upper gastrointestinal  endoscopy  03/08/2002    esophageal stricture, hiatal hernia  . Colonoscopy  03/08/2002    diverticulosis, external hemorrhoids  . Lung surgery  2011  . Esophageal dilation      x2  . Throat cancer    . Lymph node biopsy Left 09/26/2014    Procedure: EXCISIONAL BIOPSY LEFT AXILLARY LYMPH NODE;  Surgeon: Coralie Keens, MD;  Location: [facility];  Service: General;  Laterality: Left;    REVIEW OF SYSTEMS:  Constitutional: positive for fatigue Eyes: negative Ears, nose, mouth, throat, and face: negative Respiratory: negative Cardiovascular: negative Gastrointestinal: negative Genitourinary:negative Integument/breast:  negative Hematologic/lymphatic: negative Musculoskeletal:negative Neurological: negative Behavioral/Psych: negative Endocrine: negative Allergic/Immunologic: negative   PHYSICAL EXAMINATION: General appearance: alert, cooperative, fatigued and no distress Head: Normocephalic, without obvious abnormality, atraumatic Neck: no adenopathy, no JVD, supple, symmetrical, trachea midline and thyroid not enlarged, symmetric, no tenderness/mass/nodules Lymph nodes: Axillary adenopathy: 2-3 cm palpable left axillary lymphadenopathy Resp: clear to auscultation bilaterally Back: symmetric, no curvature. ROM normal. No CVA tenderness. Cardio: regular rate and rhythm, S1, S2 normal, no murmur, click, rub or gallop GI: soft, non-tender; bowel sounds normal; no masses,  no organomegaly Extremities: extremities normal, atraumatic, no cyanosis or edema Neurologic: Alert and oriented X 3, normal strength and tone. Normal symmetric reflexes. Normal coordination and gait  ECOG PERFORMANCE STATUS: 1 - Symptomatic but completely ambulatory  Blood pressure 131/80, pulse 90, temperature 98.5 F (36.9 C), temperature source Oral, resp. rate 18, height 5' 11"  (1.803 m), weight 185 lb 11.2 oz (84.233 kg), SpO2 97 %.  LABORATORY DATA: Lab Results  Component Value Date   WBC 5.3 01/02/2015   HGB 10.3* 01/02/2015   HCT 33.9* 01/02/2015   MCV 80.7 01/02/2015   PLT 159 01/02/2015      Chemistry      Component Value Date/Time   NA 143 01/02/2015 0844   NA 139 01/01/2015 1000   K 3.9 01/02/2015 0844   K 3.6 01/01/2015 1000   CL 107 01/01/2015 1000   CO2 28 01/02/2015 0844   CO2 28 01/01/2015 1000   BUN 16.6 01/02/2015 0844   BUN 13 01/01/2015 1000   CREATININE 1.0 01/02/2015 0844   CREATININE 0.91 01/01/2015 1000      Component Value Date/Time   CALCIUM 9.2 01/02/2015 0844   CALCIUM 9.0 01/01/2015 1000   ALKPHOS 49 01/02/2015 0844   ALKPHOS 50 12/19/2014 1303   AST 14 01/02/2015 0844   AST 20  12/19/2014 1303   ALT 12 01/02/2015 0844   ALT 14* 12/19/2014 1303   BILITOT 0.26 01/02/2015 0844   BILITOT 0.4 12/19/2014 1303       RADIOGRAPHIC STUDIES: Dg Chest 2 View  12/19/2014   CLINICAL DATA:  Non-Hodgkin's lymphoma, onset of mild tremors, BILATERAL extremity shaking and altered mental status during first front chemotherapy, past history COPD, former smoker, hypertension, coronary artery disease post MI  EXAM: CHEST  2 VIEW  COMPARISON:  02/17/2012  FINDINGS: Minimal enlargement of cardiac silhouette.  Large hiatal hernia.  Mediastinal contours and pulmonary vascularity otherwise normal.  Numerous EKG leads project over chest particularly on LEFT.  Lungs grossly clear.  Underlying emphysematous changes.  No infiltrate, pleural effusion, or pneumothorax.  Staple line at LEFT apex post resection.  No acute osseous findings.  Chronic height loss of a vertebra at the thoracolumbar junction.  IMPRESSION: Minimal enlargement of cardiac silhouette.  Large hiatal hernia.  COPD changes without acute abnormalities.   Electronically Signed   By: Elta Guadeloupe  Thornton Papas M.D.   On: 12/19/2014 13:26   Ct Head Wo Contrast  12/19/2014   CLINICAL DATA:  Onset of altered mental status during chemotherapy, mild tremors, shaking in extremities, weakness, symptoms lasting 6 minutes, now resolved, history non-Hodgkin's lymphoma, COPD, smoking, hypertension, coronary disease post MI  EXAM: CT HEAD WITHOUT CONTRAST  TECHNIQUE: Contiguous axial images were obtained from the base of the skull through the vertex without intravenous contrast.  COMPARISON:  None  FINDINGS: Generalized atrophy.  Normal ventricular morphology.  No midline shift or mass effect.  Small vessel chronic ischemic changes of deep cerebral white matter.  No intracranial hemorrhage, mass lesion, or acute infarction.  No extra-axial fluid collections.  Small BILATERAL basal ganglia calcifications.  Visualized paranasal sinuses and mastoid air cells clear.  Bones  unremarkable.  IMPRESSION: Atrophy with small vessel chronic ischemic changes of deep cerebral white matter.  No acute intracranial abnormalities.  If patient has persistent or recurrent seizures, recommend follow-up MR brain with and without contrast to further assess.   Electronically Signed   By: Lavonia Dana M.D.   On: 12/19/2014 13:22   Ir Fluoro Guide Cv Line Right  01/01/2015   CLINICAL DATA:  History of recurrent Hodgkin's lymphoma and poor venous access. The patient requires a porta cath.  EXAM: IMPLANTED PORT A CATH PLACEMENT WITH ULTRASOUND AND FLUOROSCOPIC GUIDANCE  ANESTHESIA/SEDATION: 2.5 Mg IV Versed; 50 mcg IV Fentanyl  Total Moderate Sedation Time:  30 minutes  Additional Medications: 2 g IV Ancef. As antibiotic prophylaxis, Ancef was ordered pre-procedure and administered intravenously within one hour of incision.  FLUOROSCOPY TIME:  24 seconds  PROCEDURE: The procedure, risks, benefits, and alternatives were explained to the patient. Questions regarding the procedure were encouraged and answered. The patient understands and consents to the procedure. A time-out was performed prior to the procedure.  The right neck and chest were prepped with chlorhexidine in a sterile fashion, and a sterile drape was applied covering the operative field. Maximum barrier sterile technique with sterile gowns and gloves were used for the procedure. Local anesthesia was provided with 1% lidocaine.  After creating a small venotomy incision, a 21 gauge needle was advanced into the right internal jugular vein under direct, real-time ultrasound guidance. Ultrasound image documentation was performed. After securing guidewire access, an 8 Fr dilator was placed. A J-wire was kinked to measure appropriate catheter length.  A subcutaneous port pocket was then created along the upper chest wall utilizing sharp and blunt dissection. Portable cautery was utilized. The pocket was irrigated with sterile saline.  A single lumen  power injectable port was chosen for placement. The 8 Fr catheter was tunneled from the port pocket site to the venotomy incision. The port was placed in the pocket. External catheter was trimmed to appropriate length based on guidewire measurement.  At the venotomy, an 8 Fr peel-away sheath was placed over a guidewire. The catheter was then placed through the sheath and the sheath removed. Final catheter positioning was confirmed and documented with a fluoroscopic spot image. The port was accessed with a needle and aspirated and flushed with heparinized saline. The needle was removed.  The venotomy and port pocket incisions were closed with subcutaneous 3-0 Monocryl and subcuticular 4-0 Vicryl. Dermabond was applied to both incisions.  COMPLICATIONS: None  FINDINGS: After catheter placement, the tip lies at the cavoatrial junction. The catheter aspirates normally and is ready for immediate use.  IMPRESSION: Placement of single lumen port a cath via right internal jugular  vein. The catheter tip lies at the cavoatrial junction. A power injectable port a cath was placed and is ready for immediate use.   Electronically Signed   By: Aletta Edouard M.D.   On: 01/01/2015 16:16   Ir US Guide Vasc Access Right  01/01/2015   CLINICAL DATA:  History of recurrent Hodgkin's lymphoma and poor venous access. The patient requires a porta cath.  EXAM: IMPLANTED PORT A CATH PLACEMENT WITH ULTRASOUND AND FLUOROSCOPIC GUIDANCE  ANESTHESIA/SEDATION: 2.5 Mg IV Versed; 50 mcg IV Fentanyl  Total Moderate Sedation Time:  30 minutes  Additional Medications: 2 g IV Ancef. As antibiotic prophylaxis, Ancef was ordered pre-procedure and administered intravenously within one hour of incision.  FLUOROSCOPY TIME:  24 seconds  PROCEDURE: The procedure, risks, benefits, and alternatives were explained to the patient. Questions regarding the procedure were encouraged and answered. The patient understands and consents to the procedure. A time-out  was performed prior to the procedure.  The right neck and chest were prepped with chlorhexidine in a sterile fashion, and a sterile drape was applied covering the operative field. Maximum barrier sterile technique with sterile gowns and gloves were used for the procedure. Local anesthesia was provided with 1% lidocaine.  After creating a small venotomy incision, a 21 gauge needle was advanced into the right internal jugular vein under direct, real-time ultrasound guidance. Ultrasound image documentation was performed. After securing guidewire access, an 8 Fr dilator was placed. A J-wire was kinked to measure appropriate catheter length.  A subcutaneous port pocket was then created along the upper chest wall utilizing sharp and blunt dissection. Portable cautery was utilized. The pocket was irrigated with sterile saline.  A single lumen power injectable port was chosen for placement. The 8 Fr catheter was tunneled from the port pocket site to the venotomy incision. The port was placed in the pocket. External catheter was trimmed to appropriate length based on guidewire measurement.  At the venotomy, an 8 Fr peel-away sheath was placed over a guidewire. The catheter was then placed through the sheath and the sheath removed. Final catheter positioning was confirmed and documented with a fluoroscopic spot image. The port was accessed with a needle and aspirated and flushed with heparinized saline. The needle was removed.  The venotomy and port pocket incisions were closed with subcutaneous 3-0 Monocryl and subcuticular 4-0 Vicryl. Dermabond was applied to both incisions.  COMPLICATIONS: None  FINDINGS: After catheter placement, the tip lies at the cavoatrial junction. The catheter aspirates normally and is ready for immediate use.  IMPRESSION: Placement of single lumen port a cath via right internal jugular vein. The catheter tip lies at the cavoatrial junction. A power injectable port a cath was placed and is ready for  immediate use.   Electronically Signed   By: Aletta Edouard M.D.   On: 01/01/2015 16:16   Patient: WELLS, MABE Collected: 12/03/2014 Client: [facility] Accession: [id number] Received: 12/03/2014 John "Ronny Bacon, MD DOB: 07-Jul-1932 Age: 55 Gender: M Reported: 12/05/2014 [address] Patient Ph: [phone] MRN #: [id number] [place], [place] [address] Visit #: [id number] Chart #: Phone: [phone] Fax: CC: Curt Bears, MD BONE MARROW REPORT FINAL DIAGNOSIS Diagnosis Bone Marrow, Aspirate,Biopsy, and Clot, left iliac - HYPERCELLULAR BONE MARROW FOR AGE WITH TRILINEAGE HEMATOPOIESIS. - A FEW SMALL LYMPHOID AGGREGATES PRESENT. - SEE COMMENT. PERIPHERAL BLOOD: - NORMOCYTIC-NORMOCHROMIC ANEMIA. - MILD LEUKOPENIA. Diagnosis Note The bone marrow is hypercellular for age with trilineage hematopoiesis and non-specific myeloid changes. The clot sections in  particular display a few small interstitial and relatively well-circumscribed lymphoid aggregates mostly composed of small lymphocytes. An admixed component of large atypical lymphoid cells is not appreciated. Flow cytometric analysis and immunoperoxidase stains fail to show any significant T or B-cell phenotypic abnormalities. In the setting of a previous history of nodular lymphocyte predominant Hodgkin lymphoma, the appearance is not considered specific or diagnostic of marrow involvement by lymphoma. Clinical correlation is recommended. Susanne Greenhouse MD Pathologist, Electronic Signature (Case signed 12/05/2014)  ASSESSMENT AND PLAN: This is a very pleasant 81 years old African-American male with recurrent nodular lymphocyte predominant Hodgkin lymphoma presenting with large left axillary lymphadenopathy in addition to prevascular lymphadenopathy seen on the previous scan. He completed the evaluation before starting the chemotherapy. The bone marrow biopsy and aspirate showed no evidence for involvement with Hodgkin's  lymphoma. His 2 D echo showed ejection fraction of 55-60 %.  He is status post Port-A-Cath placement on 01/01/2015. A prescription for Emma cream was sent to his pharmacy of record via E scribe. The patient was discussed with and also seen by Dr. Julien Nordmann. He'll proceed with day 1 of cycle #1 of his systemic chemotherapy with ABVD. He will follow-up in 2 weeks for reevaluation prior to day 15 of cycle #1. He was advised to call immediately if he has any concerning symptoms in the interval. The patient voices understanding of current disease status and treatment options and is in agreement with the current care plan.  All questions were answered. The patient knows to call the clinic with any problems, questions or concerns. We can certainly see the patient much sooner if necessary.  Carlton Adam, PA-C 01/02/2015  ADDENDUM: Hematology/Oncology Attending: I had a face to face encounter with the patient. I recommended his care plan. This is a very pleasant 81 years old African-American male with recurrent Hodgkin's lymphoma is here today to start the first cycle of his systemic chemotherapy with ABVD. The patient had a Port-A-Cath placement recently and he is related to start the treatment. We will see him back for follow-up visit in 2 weeks for reevaluation and management of any adverse effect of his chemotherapy. He was advised to call immediately if he has any concerning symptoms in the interval.  Disclaimer: This note was dictated with voice recognition software. Similar sounding words can inadvertently be transcribed and may not be corrected upon review. Eilleen Kempf., MD 01/06/2015

## 2015-01-02 NOTE — Patient Instructions (Signed)
Follow up in 2 weeks with labs and chemotherapy

## 2015-01-02 NOTE — Patient Instructions (Signed)
[facility] Discharge Instructions for Patients Receiving Chemotherapy  Today you received the following chemotherapy agents Adriamycin, vinblasitne, bleomycin, DTIC  To help prevent nausea and vomiting after your treatment, we encourage you to take your nausea medication   If you develop nausea and vomiting that is not controlled by your nausea medication, call the clinic.   BELOW ARE SYMPTOMS THAT SHOULD BE REPORTED IMMEDIATELY:  *FEVER GREATER THAN 100.5 F  *CHILLS WITH OR WITHOUT FEVER  NAUSEA AND VOMITING THAT IS NOT CONTROLLED WITH YOUR NAUSEA MEDICATION  *UNUSUAL SHORTNESS OF BREATH  *UNUSUAL BRUISING OR BLEEDING  TENDERNESS IN MOUTH AND THROAT WITH OR WITHOUT PRESENCE OF ULCERS  *URINARY PROBLEMS  *BOWEL PROBLEMS  UNUSUAL RASH Items with * indicate a potential emergency and should be followed up as soon as possible.  Feel free to call the clinic you have any questions or concerns. The clinic phone number is [phone].  Please show the [facility] at check-in to the Emergency Department and triage nurse.

## 2015-01-10 ENCOUNTER — Other Ambulatory Visit: Payer: Self-pay | Admitting: Physician Assistant

## 2015-01-10 DIAGNOSIS — T451X5A Adverse effect of antineoplastic and immunosuppressive drugs, initial encounter: Principal | ICD-10-CM

## 2015-01-10 DIAGNOSIS — R112 Nausea with vomiting, unspecified: Secondary | ICD-10-CM

## 2015-01-17 ENCOUNTER — Telehealth: Payer: Self-pay | Admitting: *Deleted

## 2015-01-17 ENCOUNTER — Ambulatory Visit (HOSPITAL_BASED_OUTPATIENT_CLINIC_OR_DEPARTMENT_OTHER): Payer: Medicare Other | Admitting: Internal Medicine

## 2015-01-17 ENCOUNTER — Encounter: Payer: Self-pay | Admitting: Internal Medicine

## 2015-01-17 ENCOUNTER — Other Ambulatory Visit (HOSPITAL_BASED_OUTPATIENT_CLINIC_OR_DEPARTMENT_OTHER): Payer: Medicare Other

## 2015-01-17 ENCOUNTER — Telehealth: Payer: Self-pay | Admitting: Internal Medicine

## 2015-01-17 ENCOUNTER — Ambulatory Visit (HOSPITAL_BASED_OUTPATIENT_CLINIC_OR_DEPARTMENT_OTHER): Payer: Medicare Other

## 2015-01-17 VITALS — BP 132/98 | HR 98 | Temp 98.7°F | Resp 18 | Ht 71.0 in | Wt 187.1 lb

## 2015-01-17 DIAGNOSIS — C8194 Hodgkin lymphoma, unspecified, lymph nodes of axilla and upper limb: Secondary | ICD-10-CM

## 2015-01-17 DIAGNOSIS — Z5111 Encounter for antineoplastic chemotherapy: Secondary | ICD-10-CM

## 2015-01-17 DIAGNOSIS — D709 Neutropenia, unspecified: Secondary | ICD-10-CM | POA: Diagnosis not present

## 2015-01-17 DIAGNOSIS — C8190 Hodgkin lymphoma, unspecified, unspecified site: Secondary | ICD-10-CM

## 2015-01-17 LAB — CBC WITH DIFFERENTIAL/PLATELET
BASO%: 0.3 % (ref 0.0–2.0)
BASOS ABS: 0 10*3/uL (ref 0.0–0.1)
EOS%: 1 % (ref 0.0–7.0)
Eosinophils Absolute: 0 10*3/uL (ref 0.0–0.5)
HEMATOCRIT: 30.2 % — AB (ref 38.4–49.9)
HGB: 9.2 g/dL — ABNORMAL LOW (ref 13.0–17.1)
LYMPH#: 1.5 10*3/uL (ref 0.9–3.3)
LYMPH%: 47.1 % (ref 14.0–49.0)
MCH: 24.6 pg — AB (ref 27.2–33.4)
MCHC: 30.5 g/dL — AB (ref 32.0–36.0)
MCV: 80.7 fL (ref 79.3–98.0)
MONO#: 0.7 10*3/uL (ref 0.1–0.9)
MONO%: 21.8 % — AB (ref 0.0–14.0)
NEUT#: 0.9 10*3/uL — ABNORMAL LOW (ref 1.5–6.5)
NEUT%: 29.8 % — AB (ref 39.0–75.0)
Platelets: 281 10*3/uL (ref 140–400)
RBC: 3.74 10*6/uL — AB (ref 4.20–5.82)
RDW: 19.9 % — ABNORMAL HIGH (ref 11.0–14.6)
WBC: 3.1 10*3/uL — ABNORMAL LOW (ref 4.0–10.3)

## 2015-01-17 LAB — COMPREHENSIVE METABOLIC PANEL (CC13)
ALK PHOS: 46 U/L (ref 40–150)
ALT: 16 U/L (ref 0–55)
AST: 14 U/L (ref 5–34)
Albumin: 3.3 g/dL — ABNORMAL LOW (ref 3.5–5.0)
Anion Gap: 5 mEq/L (ref 3–11)
BUN: 14.4 mg/dL (ref 7.0–26.0)
CHLORIDE: 109 meq/L (ref 98–109)
CO2: 26 mEq/L (ref 22–29)
Calcium: 9.3 mg/dL (ref 8.4–10.4)
Creatinine: 1 mg/dL (ref 0.7–1.3)
EGFR: 87 mL/min/{1.73_m2} — AB (ref >90)
GLUCOSE: 103 mg/dL (ref 70–140)
POTASSIUM: 4.2 meq/L (ref 3.5–5.1)
SODIUM: 141 meq/L (ref 136–145)
Total Bilirubin: <0.3 mg/dL (ref 0.20–1.20)
Total Protein: 6.5 g/dL (ref 6.4–8.3)

## 2015-01-17 LAB — URIC ACID (CC13): Uric Acid, Serum: 4 mg/dl (ref 2.6–7.4)

## 2015-01-17 LAB — LACTATE DEHYDROGENASE (CC13): LDH: 208 U/L (ref 125–245)

## 2015-01-17 MED ORDER — SODIUM CHLORIDE 0.9 % IV SOLN
10.0000 [IU]/m2 | Freq: Once | INTRAVENOUS | Status: AC
  Administered 2015-01-17: 20 [IU] via INTRAVENOUS
  Filled 2015-01-17: qty 6.67

## 2015-01-17 MED ORDER — DACARBAZINE 200 MG IV SOLR
375.0000 mg/m2 | Freq: Once | INTRAVENOUS | Status: AC
  Administered 2015-01-17: 760 mg via INTRAVENOUS
  Filled 2015-01-17: qty 38

## 2015-01-17 MED ORDER — SODIUM CHLORIDE 0.9 % IV SOLN
Freq: Once | INTRAVENOUS | Status: AC
  Administered 2015-01-17: 11:00:00 via INTRAVENOUS
  Filled 2015-01-17: qty 8

## 2015-01-17 MED ORDER — HEPARIN SOD (PORK) LOCK FLUSH 100 UNIT/ML IV SOLN
500.0000 [IU] | Freq: Once | INTRAVENOUS | Status: AC | PRN
  Administered 2015-01-17: 500 [IU]
  Filled 2015-01-17: qty 5

## 2015-01-17 MED ORDER — SODIUM CHLORIDE 0.9 % IJ SOLN
10.0000 mL | INTRAMUSCULAR | Status: DC | PRN
  Administered 2015-01-17: 10 mL
  Filled 2015-01-17: qty 10

## 2015-01-17 MED ORDER — VINBLASTINE SULFATE CHEMO INJECTION 1 MG/ML
5.9000 mg/m2 | Freq: Once | INTRAVENOUS | Status: AC
  Administered 2015-01-17: 12 mg via INTRAVENOUS
  Filled 2015-01-17: qty 12

## 2015-01-17 MED ORDER — SODIUM CHLORIDE 0.9 % IV SOLN
Freq: Once | INTRAVENOUS | Status: AC
  Administered 2015-01-17: 11:00:00 via INTRAVENOUS

## 2015-01-17 MED ORDER — DOXORUBICIN HCL CHEMO IV INJECTION 2 MG/ML
25.0000 mg/m2 | Freq: Once | INTRAVENOUS | Status: AC
  Administered 2015-01-17: 50 mg via INTRAVENOUS
  Filled 2015-01-17: qty 25

## 2015-01-17 NOTE — Patient Instructions (Signed)
[facility] Discharge Instructions for Patients Receiving Chemotherapy  Today you received the following chemotherapy agents: Adriamycin, Vinblastine, Bleomycin, and Dacarbazine.  To help prevent nausea and vomiting after your treatment, we encourage you to take your nausea medication as directed.    If you develop nausea and vomiting that is not controlled by your nausea medication, call the clinic.   BELOW ARE SYMPTOMS THAT SHOULD BE REPORTED IMMEDIATELY:  *FEVER GREATER THAN 100.5 F  *CHILLS WITH OR WITHOUT FEVER  NAUSEA AND VOMITING THAT IS NOT CONTROLLED WITH YOUR NAUSEA MEDICATION  *UNUSUAL SHORTNESS OF BREATH  *UNUSUAL BRUISING OR BLEEDING  TENDERNESS IN MOUTH AND THROAT WITH OR WITHOUT PRESENCE OF ULCERS  *URINARY PROBLEMS  *BOWEL PROBLEMS  UNUSUAL RASH Items with * indicate a potential emergency and should be followed up as soon as possible.  Feel free to call the clinic you have any questions or concerns. The clinic phone number is [phone].  Please show the [facility] at check-in to the Emergency Department and triage nurse.

## 2015-01-17 NOTE — Progress Notes (Signed)
Blood return noted before, every 5cc, and after Adriamycin and Before and after Vinblastine.

## 2015-01-17 NOTE — Telephone Encounter (Signed)
per pof to sch pt appt-sent MW to sch trmt-gave pt copy of avs

## 2015-01-17 NOTE — Telephone Encounter (Signed)
Per staff message and POF I have scheduled appts. Advised scheduler of appts. JMW  

## 2015-01-17 NOTE — Progress Notes (Signed)
Per Dr Julien Nordmann it is okay to treat pt to day with chemotherapy and today's labs. Pt scheduled for neulasta tomorrow.

## 2015-01-17 NOTE — Progress Notes (Signed)
[facility] [phone]) [phone]   [phone]  OFFICE PROGRESS NOTE  Kandice Hams, MD [address] [place] [address]  Principle Diagnosis: Hodgkin's lymphoma, initially diagnosed in 1979,recurrence in April 2011  Prior Therapy: Status post radiation 44 CT Y without systemic chemotherapy  Current therapy: Systemic chemotherapy with ABVD on days 1 and 15 every 4 weeks. Status post day 1 of the first cycle.  INTERVAL HISTORY: Jim Beck 81 y.o. male returns to the clinic today for follow-up visit. The patient is feeling fine today with no specific complaints. He tolerated the first dose of his treatment fairly well. He denied having any significant fever or chills. He has no nausea or vomiting. The patient denied having any significant chest pain, shortness of breath, cough or hemoptysis. No significant weight loss or night sweats. He is here today to start day 15 of the first cycle.  MEDICAL HISTORY: Past Medical History  Diagnosis Date  . COPD (chronic obstructive pulmonary disease)   . Tobacco abuse   . Hypertension   . Hyperlipemia   . GERD (gastroesophageal reflux disease)     large hiatal hernia and stricture  . Hx of blood diseases     bullous blood disease  . PUD (peptic ulcer disease)   . BPH (benign prostatic hypertrophy)   . History of alcohol abuse   . Heart murmur   . Diverticulosis   . External hemorrhoids   . Arthritis   . Myocardial infarction   . Substance abuse   . Personal history of colonic polyps - adenomas 11/30/2013    12 mm and diminutive adenoma - no recall planned (age)  . Shortness of breath dyspnea     with exertion  . Depression   . Constipation   . History of hiatal hernia     per notes  . Hodgkin lymphoma 07/2009    head and neck and throat    ALLERGIES:  is allergic to aspirin.  MEDICATIONS:  Current Outpatient Prescriptions  Medication Sig Dispense Refill  . allopurinol  (ZYLOPRIM) 100 MG tablet Take 1 tablet (100 mg total) by mouth 2 (two) times daily. (Patient taking differently: Take 100 mg by mouth daily. ) 60 tablet 2  . amLODipine (NORVASC) 2.5 MG tablet Take 2.5 mg by mouth every morning.   0  . aspirin EC 81 MG tablet Take 81 mg by mouth every morning.     . lidocaine-prilocaine (EMLA) cream Apply 1 application topically as needed. 30 g 1  . rosuvastatin (CRESTOR) 10 MG tablet Take 10 mg by mouth every morning.     Marland Kitchen HYDROcodone-acetaminophen (NORCO) 5-325 MG per tablet Take 1-2 tablets by mouth every 4 (four) hours as needed. (Patient not taking: Reported on 11/27/2014) 30 tablet 0  . prochlorperazine (COMPAZINE) 10 MG tablet Take 1 tablet (10 mg total) by mouth every 6 (six) hours as needed for nausea or vomiting. (Patient not taking: Reported on 01/17/2015) 30 tablet 1   No current facility-administered medications for this visit.    SURGICAL HISTORY:  Past Surgical History  Procedure Laterality Date  . Inguinal hernia repair      bilateral  . Upper gastrointestinal endoscopy  03/08/2002    esophageal stricture, hiatal hernia  . Colonoscopy  03/08/2002    diverticulosis, external hemorrhoids  . Lung surgery  2011  . Esophageal dilation      x2  . Throat cancer    . Lymph node  biopsy Left 09/26/2014    Procedure: EXCISIONAL BIOPSY LEFT AXILLARY LYMPH NODE;  Surgeon: Coralie Keens, MD;  Location: [facility];  Service: General;  Laterality: Left;    REVIEW OF SYSTEMS:  Constitutional: positive for fatigue Eyes: negative Ears, nose, mouth, throat, and face: negative Respiratory: negative Cardiovascular: negative Gastrointestinal: negative Genitourinary:negative Integument/breast: negative Hematologic/lymphatic: negative Musculoskeletal:negative Neurological: negative Behavioral/Psych: negative Endocrine: negative Allergic/Immunologic: negative   PHYSICAL EXAMINATION: General appearance: alert, cooperative, fatigued and no distress Head:  Normocephalic, without obvious abnormality, atraumatic Neck: no adenopathy, no JVD, supple, symmetrical, trachea midline and thyroid not enlarged, symmetric, no tenderness/mass/nodules Lymph nodes: Axillary adenopathy: 2-3 cm palpable left axillary lymphadenopathy Resp: clear to auscultation bilaterally Back: symmetric, no curvature. ROM normal. No CVA tenderness. Cardio: regular rate and rhythm, S1, S2 normal, no murmur, click, rub or gallop GI: soft, non-tender; bowel sounds normal; no masses,  no organomegaly Extremities: extremities normal, atraumatic, no cyanosis or edema Neurologic: Alert and oriented X 3, normal strength and tone. Normal symmetric reflexes. Normal coordination and gait  ECOG PERFORMANCE STATUS: 1 - Symptomatic but completely ambulatory  Blood pressure 132/98, pulse 98, temperature 98.7 F (37.1 C), temperature source Oral, resp. rate 18, height 5\' 11"  (1.803 m), weight 187 lb 1.6 oz (84.868 kg), SpO2 93 %.  LABORATORY DATA: Lab Results  Component Value Date   WBC 3.1* 01/17/2015   HGB 9.2* 01/17/2015   HCT 30.2* 01/17/2015   MCV 80.7 01/17/2015   PLT 281 01/17/2015      Chemistry      Component Value Date/Time   NA 141 01/17/2015 0836   NA 139 01/01/2015 1000   K 4.2 01/17/2015 0836   K 3.6 01/01/2015 1000   CL 107 01/01/2015 1000   CO2 26 01/17/2015 0836   CO2 28 01/01/2015 1000   BUN 14.4 01/17/2015 0836   BUN 13 01/01/2015 1000   CREATININE 1.0 01/17/2015 0836   CREATININE 0.91 01/01/2015 1000      Component Value Date/Time   CALCIUM 9.3 01/17/2015 0836   CALCIUM 9.0 01/01/2015 1000   ALKPHOS 46 01/17/2015 0836   ALKPHOS 50 12/19/2014 1303   AST 14 01/17/2015 0836   AST 20 12/19/2014 1303   ALT 16 01/17/2015 0836   ALT 14* 12/19/2014 1303   BILITOT <0.30 01/17/2015 0836   BILITOT 0.4 12/19/2014 1303       RADIOGRAPHIC STUDIES: Dg Chest 2 View  12/19/2014   CLINICAL DATA:  Non-Hodgkin's lymphoma, onset of mild tremors, BILATERAL  extremity shaking and altered mental status during first front chemotherapy, past history COPD, former smoker, hypertension, coronary artery disease post MI  EXAM: CHEST  2 VIEW  COMPARISON:  02/17/2012  FINDINGS: Minimal enlargement of cardiac silhouette.  Large hiatal hernia.  Mediastinal contours and pulmonary vascularity otherwise normal.  Numerous EKG leads project over chest particularly on LEFT.  Lungs grossly clear.  Underlying emphysematous changes.  No infiltrate, pleural effusion, or pneumothorax.  Staple line at LEFT apex post resection.  No acute osseous findings.  Chronic height loss of a vertebra at the thoracolumbar junction.  IMPRESSION: Minimal enlargement of cardiac silhouette.  Large hiatal hernia.  COPD changes without acute abnormalities.   Electronically Signed   By: Lavonia Dana M.D.   On: 12/19/2014 13:26   Ct Head Wo Contrast  12/19/2014   CLINICAL DATA:  Onset of altered mental status during chemotherapy, mild tremors, shaking in extremities, weakness, symptoms lasting 6 minutes, now resolved, history non-Hodgkin's lymphoma, COPD, smoking, hypertension, coronary disease  post MI  EXAM: CT HEAD WITHOUT CONTRAST  TECHNIQUE: Contiguous axial images were obtained from the base of the skull through the vertex without intravenous contrast.  COMPARISON:  None  FINDINGS: Generalized atrophy.  Normal ventricular morphology.  No midline shift or mass effect.  Small vessel chronic ischemic changes of deep cerebral white matter.  No intracranial hemorrhage, mass lesion, or acute infarction.  No extra-axial fluid collections.  Small BILATERAL basal ganglia calcifications.  Visualized paranasal sinuses and mastoid air cells clear.  Bones unremarkable.  IMPRESSION: Atrophy with small vessel chronic ischemic changes of deep cerebral white matter.  No acute intracranial abnormalities.  If patient has persistent or recurrent seizures, recommend follow-up MR brain with and without contrast to further  assess.   Electronically Signed   By: Lavonia Dana M.D.   On: 12/19/2014 13:22   Ir Fluoro Guide Cv Line Right  01/01/2015   CLINICAL DATA:  History of recurrent Hodgkin's lymphoma and poor venous access. The patient requires a porta cath.  EXAM: IMPLANTED PORT A CATH PLACEMENT WITH ULTRASOUND AND FLUOROSCOPIC GUIDANCE  ANESTHESIA/SEDATION: 2.5 Mg IV Versed; 50 mcg IV Fentanyl  Total Moderate Sedation Time:  30 minutes  Additional Medications: 2 g IV Ancef. As antibiotic prophylaxis, Ancef was ordered pre-procedure and administered intravenously within one hour of incision.  FLUOROSCOPY TIME:  24 seconds  PROCEDURE: The procedure, risks, benefits, and alternatives were explained to the patient. Questions regarding the procedure were encouraged and answered. The patient understands and consents to the procedure. A time-out was performed prior to the procedure.  The right neck and chest were prepped with chlorhexidine in a sterile fashion, and a sterile drape was applied covering the operative field. Maximum barrier sterile technique with sterile gowns and gloves were used for the procedure. Local anesthesia was provided with 1% lidocaine.  After creating a small venotomy incision, a 21 gauge needle was advanced into the right internal jugular vein under direct, real-time ultrasound guidance. Ultrasound image documentation was performed. After securing guidewire access, an 8 Fr dilator was placed. A J-wire was kinked to measure appropriate catheter length.  A subcutaneous port pocket was then created along the upper chest wall utilizing sharp and blunt dissection. Portable cautery was utilized. The pocket was irrigated with sterile saline.  A single lumen power injectable port was chosen for placement. The 8 Fr catheter was tunneled from the port pocket site to the venotomy incision. The port was placed in the pocket. External catheter was trimmed to appropriate length based on guidewire measurement.  At the  venotomy, an 8 Fr peel-away sheath was placed over a guidewire. The catheter was then placed through the sheath and the sheath removed. Final catheter positioning was confirmed and documented with a fluoroscopic spot image. The port was accessed with a needle and aspirated and flushed with heparinized saline. The needle was removed.  The venotomy and port pocket incisions were closed with subcutaneous 3-0 Monocryl and subcuticular 4-0 Vicryl. Dermabond was applied to both incisions.  COMPLICATIONS: None  FINDINGS: After catheter placement, the tip lies at the cavoatrial junction. The catheter aspirates normally and is ready for immediate use.  IMPRESSION: Placement of single lumen port a cath via right internal jugular vein. The catheter tip lies at the cavoatrial junction. A power injectable port a cath was placed and is ready for immediate use.   Electronically Signed   By: Aletta Edouard M.D.   On: 01/01/2015 16:16   Ir US Guide Vasc Access Right  01/01/2015   CLINICAL DATA:  History of recurrent Hodgkin's lymphoma and poor venous access. The patient requires a porta cath.  EXAM: IMPLANTED PORT A CATH PLACEMENT WITH ULTRASOUND AND FLUOROSCOPIC GUIDANCE  ANESTHESIA/SEDATION: 2.5 Mg IV Versed; 50 mcg IV Fentanyl  Total Moderate Sedation Time:  30 minutes  Additional Medications: 2 g IV Ancef. As antibiotic prophylaxis, Ancef was ordered pre-procedure and administered intravenously within one hour of incision.  FLUOROSCOPY TIME:  24 seconds  PROCEDURE: The procedure, risks, benefits, and alternatives were explained to the patient. Questions regarding the procedure were encouraged and answered. The patient understands and consents to the procedure. A time-out was performed prior to the procedure.  The right neck and chest were prepped with chlorhexidine in a sterile fashion, and a sterile drape was applied covering the operative field. Maximum barrier sterile technique with sterile gowns and gloves were used for  the procedure. Local anesthesia was provided with 1% lidocaine.  After creating a small venotomy incision, a 21 gauge needle was advanced into the right internal jugular vein under direct, real-time ultrasound guidance. Ultrasound image documentation was performed. After securing guidewire access, an 8 Fr dilator was placed. A J-wire was kinked to measure appropriate catheter length.  A subcutaneous port pocket was then created along the upper chest wall utilizing sharp and blunt dissection. Portable cautery was utilized. The pocket was irrigated with sterile saline.  A single lumen power injectable port was chosen for placement. The 8 Fr catheter was tunneled from the port pocket site to the venotomy incision. The port was placed in the pocket. External catheter was trimmed to appropriate length based on guidewire measurement.  At the venotomy, an 8 Fr peel-away sheath was placed over a guidewire. The catheter was then placed through the sheath and the sheath removed. Final catheter positioning was confirmed and documented with a fluoroscopic spot image. The port was accessed with a needle and aspirated and flushed with heparinized saline. The needle was removed.  The venotomy and port pocket incisions were closed with subcutaneous 3-0 Monocryl and subcuticular 4-0 Vicryl. Dermabond was applied to both incisions.  COMPLICATIONS: None  FINDINGS: After catheter placement, the tip lies at the cavoatrial junction. The catheter aspirates normally and is ready for immediate use.  IMPRESSION: Placement of single lumen port a cath via right internal jugular vein. The catheter tip lies at the cavoatrial junction. A power injectable port a cath was placed and is ready for immediate use.   Electronically Signed   By: Aletta Edouard M.D.   On: 01/01/2015 16:16    ASSESSMENT AND PLAN: This is a very pleasant 81 years old African-American male with recurrent nodular lymphocyte predominant Hodgkin lymphoma presenting with  large left axillary lymphadenopathy in addition to prevascular lymphadenopathy seen on the previous scan. The patient is currently undergoing systemic chemotherapy with ABVD status post day 1 of cycle #1. His absolute neutrophil count is low today at 900. I gave the patient the option of proceeding with day 15 today as a scheduled and receiving Neulasta injection tomorrow versus delaying his treatment by one week until recovery of the neutrophil count. The patient would like to proceed with treatment as planned and he will come tomorrow for Neulasta injection. I would see the patient back for follow-up visit in 2 weeks for reevaluation and management of any adverse effect of his treatment.  He was advised to call immediately if he has any concerning symptoms in the interval. The patient voices understanding of current  disease status and treatment options and is in agreement with the current care plan.  All questions were answered. The patient knows to call the clinic with any problems, questions or concerns. We can certainly see the patient much sooner if necessary.  Disclaimer: This note was dictated with voice recognition software. Similar sounding words can inadvertently be transcribed and may not be corrected upon review.

## 2015-01-18 ENCOUNTER — Ambulatory Visit (HOSPITAL_BASED_OUTPATIENT_CLINIC_OR_DEPARTMENT_OTHER): Payer: Medicare Other

## 2015-01-18 VITALS — BP 112/72 | HR 98 | Temp 98.2°F

## 2015-01-18 DIAGNOSIS — D709 Neutropenia, unspecified: Secondary | ICD-10-CM

## 2015-01-18 DIAGNOSIS — D701 Agranulocytosis secondary to cancer chemotherapy: Secondary | ICD-10-CM | POA: Diagnosis not present

## 2015-01-18 DIAGNOSIS — C8194 Hodgkin lymphoma, unspecified, lymph nodes of axilla and upper limb: Secondary | ICD-10-CM | POA: Diagnosis not present

## 2015-01-18 MED ORDER — PEGFILGRASTIM INJECTION 6 MG/0.6ML
6.0000 mg | Freq: Once | SUBCUTANEOUS | Status: AC
  Administered 2015-01-18: 6 mg via SUBCUTANEOUS
  Filled 2015-01-18: qty 0.6

## 2015-01-22 ENCOUNTER — Telehealth: Payer: Self-pay | Admitting: *Deleted

## 2015-01-22 NOTE — Telephone Encounter (Signed)
I have adjusted appts 

## 2015-01-23 ENCOUNTER — Other Ambulatory Visit (HOSPITAL_BASED_OUTPATIENT_CLINIC_OR_DEPARTMENT_OTHER): Payer: Medicare Other

## 2015-01-23 ENCOUNTER — Other Ambulatory Visit: Payer: Self-pay | Admitting: *Deleted

## 2015-01-23 DIAGNOSIS — C8590 Non-Hodgkin lymphoma, unspecified, unspecified site: Secondary | ICD-10-CM

## 2015-01-23 LAB — COMPREHENSIVE METABOLIC PANEL (CC13)
ALBUMIN: 3.4 g/dL — AB (ref 3.5–5.0)
ALK PHOS: 93 U/L (ref 40–150)
ALT: 9 U/L (ref 0–55)
AST: 11 U/L (ref 5–34)
Anion Gap: 6 mEq/L (ref 3–11)
BILIRUBIN TOTAL: 0.38 mg/dL (ref 0.20–1.20)
BUN: 17 mg/dL (ref 7.0–26.0)
CALCIUM: 9.4 mg/dL (ref 8.4–10.4)
CO2: 26 mEq/L (ref 22–29)
CREATININE: 1.1 mg/dL (ref 0.7–1.3)
Chloride: 108 mEq/L (ref 98–109)
EGFR: 71 mL/min/{1.73_m2} — ABNORMAL LOW (ref >90)
GLUCOSE: 100 mg/dL (ref 70–140)
POTASSIUM: 4.2 meq/L (ref 3.5–5.1)
SODIUM: 139 meq/L (ref 136–145)
TOTAL PROTEIN: 6.5 g/dL (ref 6.4–8.3)

## 2015-01-23 LAB — CBC WITH DIFFERENTIAL/PLATELET
BASO%: 0.2 % (ref 0.0–2.0)
Basophils Absolute: 0 10*3/uL (ref 0.0–0.1)
EOS ABS: 0 10*3/uL (ref 0.0–0.5)
EOS%: 0.1 % (ref 0.0–7.0)
HEMATOCRIT: 28.5 % — AB (ref 38.4–49.9)
HGB: 8.8 g/dL — ABNORMAL LOW (ref 13.0–17.1)
LYMPH#: 2.4 10*3/uL (ref 0.9–3.3)
LYMPH%: 14.7 % (ref 14.0–49.0)
MCH: 24.2 pg — ABNORMAL LOW (ref 27.2–33.4)
MCHC: 30.9 g/dL — AB (ref 32.0–36.0)
MCV: 78.3 fL — ABNORMAL LOW (ref 79.3–98.0)
MONO#: 1.1 10*3/uL — AB (ref 0.1–0.9)
MONO%: 6.6 % (ref 0.0–14.0)
NEUT%: 78.4 % — ABNORMAL HIGH (ref 39.0–75.0)
NEUTROS ABS: 12.7 10*3/uL — AB (ref 1.5–6.5)
NRBC: 0 % (ref 0–0)
PLATELETS: 224 10*3/uL (ref 140–400)
RBC: 3.64 10*6/uL — ABNORMAL LOW (ref 4.20–5.82)
RDW: 19.7 % — AB (ref 11.0–14.6)
WBC: 16.2 10*3/uL — AB (ref 4.0–10.3)

## 2015-01-24 ENCOUNTER — Telehealth: Payer: Self-pay | Admitting: *Deleted

## 2015-01-24 NOTE — Telephone Encounter (Signed)
Patient called asking for "advice from Dr. Julien Nordmann as to why I'm having back spasms.  Started yesterday and down both legs and some dizziness.  Take tylenol which slows it for a few hours.  It's like a nervous condition.  Experienced this a [place] time ago but it wasn't this bad."  Chemotherapy 01-17-2015.  Drinks three 12 to 16 oz waters daily.  "Last BM was Sunday but I have to take MOM for bowels to work."  Denies abdominal swelling, tenderness or pain.  Offered to come in for [facility].  Willing to come in later.  Has a nurse coming to the home this morning that comes annually.  Return number [phone].

## 2015-01-29 NOTE — Telephone Encounter (Addendum)
FYI  Called patient at 6:05 to check on him.  "I took two tylenol and everything is better.  I am still having dizziness.  The nurse from [employer] that comes to check on [place] every year came by and I'm doing fine.  I have an appointment there tomorrow what's that about?" Advised he has lab, see Mid-level heather Boelter and chemotherapy treatment if everything checks well with labs and VS.  No further questions.

## 2015-01-30 ENCOUNTER — Encounter: Payer: Self-pay | Admitting: Nurse Practitioner

## 2015-01-30 ENCOUNTER — Other Ambulatory Visit (HOSPITAL_BASED_OUTPATIENT_CLINIC_OR_DEPARTMENT_OTHER): Payer: Medicare Other

## 2015-01-30 ENCOUNTER — Ambulatory Visit (HOSPITAL_BASED_OUTPATIENT_CLINIC_OR_DEPARTMENT_OTHER): Payer: Medicare Other | Admitting: Nurse Practitioner

## 2015-01-30 ENCOUNTER — Ambulatory Visit (HOSPITAL_BASED_OUTPATIENT_CLINIC_OR_DEPARTMENT_OTHER): Payer: Medicare Other

## 2015-01-30 VITALS — BP 109/57 | HR 81 | Temp 98.5°F | Resp 20 | Ht 71.0 in | Wt 189.9 lb

## 2015-01-30 DIAGNOSIS — K5900 Constipation, unspecified: Secondary | ICD-10-CM

## 2015-01-30 DIAGNOSIS — R42 Dizziness and giddiness: Secondary | ICD-10-CM

## 2015-01-30 DIAGNOSIS — C8190 Hodgkin lymphoma, unspecified, unspecified site: Secondary | ICD-10-CM

## 2015-01-30 DIAGNOSIS — C8194 Hodgkin lymphoma, unspecified, lymph nodes of axilla and upper limb: Secondary | ICD-10-CM | POA: Diagnosis not present

## 2015-01-30 DIAGNOSIS — Z5111 Encounter for antineoplastic chemotherapy: Secondary | ICD-10-CM

## 2015-01-30 LAB — COMPREHENSIVE METABOLIC PANEL (CC13)
ALT: 13 U/L (ref 0–55)
ANION GAP: 7 meq/L (ref 3–11)
AST: 14 U/L (ref 5–34)
Albumin: 3.3 g/dL — ABNORMAL LOW (ref 3.5–5.0)
Alkaline Phosphatase: 114 U/L (ref 40–150)
BILIRUBIN TOTAL: 0.31 mg/dL (ref 0.20–1.20)
BUN: 12.2 mg/dL (ref 7.0–26.0)
CALCIUM: 9.4 mg/dL (ref 8.4–10.4)
CHLORIDE: 110 meq/L — AB (ref 98–109)
CO2: 27 meq/L (ref 22–29)
Creatinine: 1.1 mg/dL (ref 0.7–1.3)
EGFR: 72 mL/min/{1.73_m2} — AB (ref >90)
Glucose: 132 mg/dl (ref 70–140)
Potassium: 4 mEq/L (ref 3.5–5.1)
Sodium: 143 mEq/L (ref 136–145)
Total Protein: 6.5 g/dL (ref 6.4–8.3)

## 2015-01-30 LAB — CBC WITH DIFFERENTIAL/PLATELET
BASO%: 0.1 % (ref 0.0–2.0)
BASOS ABS: 0 10*3/uL (ref 0.0–0.1)
EOS ABS: 0 10*3/uL (ref 0.0–0.5)
EOS%: 0.1 % (ref 0.0–7.0)
HEMATOCRIT: 30.5 % — AB (ref 38.4–49.9)
HGB: 9.3 g/dL — ABNORMAL LOW (ref 13.0–17.1)
LYMPH%: 6.5 % — AB (ref 14.0–49.0)
MCH: 24.6 pg — ABNORMAL LOW (ref 27.2–33.4)
MCHC: 30.5 g/dL — AB (ref 32.0–36.0)
MCV: 80.4 fL (ref 79.3–98.0)
MONO#: 1.2 10*3/uL — AB (ref 0.1–0.9)
MONO%: 5.3 % (ref 0.0–14.0)
NEUT#: 19.2 10*3/uL — ABNORMAL HIGH (ref 1.5–6.5)
NEUT%: 88 % — AB (ref 39.0–75.0)
PLATELETS: 139 10*3/uL — AB (ref 140–400)
RBC: 3.8 10*6/uL — AB (ref 4.20–5.82)
RDW: 22 % — ABNORMAL HIGH (ref 11.0–14.6)
WBC: 21.8 10*3/uL — ABNORMAL HIGH (ref 4.0–10.3)
lymph#: 1.4 10*3/uL (ref 0.9–3.3)

## 2015-01-30 LAB — URIC ACID (CC13): URIC ACID, SERUM: 7.9 mg/dL — AB (ref 2.6–7.4)

## 2015-01-30 MED ORDER — SODIUM CHLORIDE 0.9 % IJ SOLN
10.0000 mL | INTRAMUSCULAR | Status: DC | PRN
  Administered 2015-01-30: 10 mL
  Filled 2015-01-30: qty 10

## 2015-01-30 MED ORDER — SODIUM CHLORIDE 0.9 % IV SOLN
10.0000 [IU]/m2 | Freq: Once | INTRAVENOUS | Status: AC
  Administered 2015-01-30: 20 [IU] via INTRAVENOUS
  Filled 2015-01-30: qty 6.67

## 2015-01-30 MED ORDER — SODIUM CHLORIDE 0.9 % IV SOLN
375.0000 mg/m2 | Freq: Once | INTRAVENOUS | Status: AC
  Administered 2015-01-30: 760 mg via INTRAVENOUS
  Filled 2015-01-30: qty 38

## 2015-01-30 MED ORDER — HEPARIN SOD (PORK) LOCK FLUSH 100 UNIT/ML IV SOLN
500.0000 [IU] | Freq: Once | INTRAVENOUS | Status: AC | PRN
  Administered 2015-01-30: 500 [IU]
  Filled 2015-01-30: qty 5

## 2015-01-30 MED ORDER — SODIUM CHLORIDE 0.9 % IV SOLN
Freq: Once | INTRAVENOUS | Status: AC
  Administered 2015-01-30: 14:00:00 via INTRAVENOUS

## 2015-01-30 MED ORDER — VINBLASTINE SULFATE CHEMO INJECTION 1 MG/ML
5.9000 mg/m2 | Freq: Once | INTRAVENOUS | Status: AC
  Administered 2015-01-30: 12 mg via INTRAVENOUS
  Filled 2015-01-30: qty 12

## 2015-01-30 MED ORDER — DOXORUBICIN HCL CHEMO IV INJECTION 2 MG/ML
25.0000 mg/m2 | Freq: Once | INTRAVENOUS | Status: AC
  Administered 2015-01-30: 50 mg via INTRAVENOUS
  Filled 2015-01-30: qty 25

## 2015-01-30 MED ORDER — SODIUM CHLORIDE 0.9 % IV SOLN
Freq: Once | INTRAVENOUS | Status: AC
  Administered 2015-01-30: 14:00:00 via INTRAVENOUS
  Filled 2015-01-30: qty 8

## 2015-01-30 NOTE — Patient Instructions (Signed)
[facility] Discharge Instructions for Patients Receiving Chemotherapy  Today you received the following chemotherapy agents: Adriamycin, Vinblastine, Bleomycin, and Dacarbazine.  To help prevent nausea and vomiting after your treatment, we encourage you to take your nausea medication as directed.    If you develop nausea and vomiting that is not controlled by your nausea medication, call the clinic.   BELOW ARE SYMPTOMS THAT SHOULD BE REPORTED IMMEDIATELY:  *FEVER GREATER THAN 100.5 F  *CHILLS WITH OR WITHOUT FEVER  NAUSEA AND VOMITING THAT IS NOT CONTROLLED WITH YOUR NAUSEA MEDICATION  *UNUSUAL SHORTNESS OF BREATH  *UNUSUAL BRUISING OR BLEEDING  TENDERNESS IN MOUTH AND THROAT WITH OR WITHOUT PRESENCE OF ULCERS  *URINARY PROBLEMS  *BOWEL PROBLEMS  UNUSUAL RASH Items with * indicate a potential emergency and should be followed up as soon as possible.  Feel free to call the clinic you have any questions or concerns. The clinic phone number is [phone].  Please show the [facility] at check-in to the Emergency Department and triage nurse.

## 2015-01-30 NOTE — Progress Notes (Signed)
[facility] [phone]   Fax:(336) [phone]  OFFICE PROGRESS NOTE  Kandice Hams, MD [address] [place] [address]  Principle Diagnosis: Hodgkin's lymphoma, initially diagnosed in 1979,recurrence in April 2011  Prior Therapy: Status post radiation 36 CT Y without systemic chemotherapy  Current therapy: Systemic chemotherapy with ABVD on days 1 and 15 every 4 weeks. Status 1 cycle.  INTERVAL HISTORY: Jim Beck 81 y.o. male returns to the clinic today for follow-up visit. He is here to begin cycle #2 of ABVD. He tolerates this well so far. He denies fevers, chills, nausea, or vomiting. He gets fairly constipated at times and uses milk of magnesia to have a bowel movement at times. He has pain to his cervical spine that is relieved with tylenol. He stopped taking his blood pressure medicine 1 week ago because he thought it was making him dizzy. He is still dizzy today and his blood pressure is 109/57. He has some shortness of breath, but does not require oxygen. He denies chest pain, cough, or palpitations. His appetite is good and he is maintaining his weight. He denies night sweats. A detailed review of systems is otherwise stable.  MEDICAL HISTORY: Past Medical History  Diagnosis Date  . COPD (chronic obstructive pulmonary disease)   . Tobacco abuse   . Hypertension   . Hyperlipemia   . GERD (gastroesophageal reflux disease)     large hiatal hernia and stricture  . Hx of blood diseases     bullous blood disease  . PUD (peptic ulcer disease)   . BPH (benign prostatic hypertrophy)   . History of alcohol abuse   . Heart murmur   . Diverticulosis   . External hemorrhoids   . Arthritis   . Myocardial infarction   . Substance abuse   . Personal history of colonic polyps - adenomas 11/30/2013    12 mm and diminutive adenoma - no recall planned (age)  . Shortness of breath dyspnea     with exertion  . Depression   .  Constipation   . History of hiatal hernia     per notes  . Hodgkin lymphoma 07/2009    head and neck and throat    ALLERGIES:  is allergic to aspirin.  MEDICATIONS:  Current Outpatient Prescriptions  Medication Sig Dispense Refill  . allopurinol (ZYLOPRIM) 100 MG tablet Take 1 tablet (100 mg total) by mouth 2 (two) times daily. (Patient taking differently: Take 100 mg by mouth daily. ) 60 tablet 2  . aspirin EC 81 MG tablet Take 81 mg by mouth every morning.     . lidocaine-prilocaine (EMLA) cream Apply 1 application topically as needed. 30 g 1  . prochlorperazine (COMPAZINE) 10 MG tablet take 1 tablet by mouth every 6 hours if needed for nausea and vomiting 30 tablet 1  . rosuvastatin (CRESTOR) 10 MG tablet Take 10 mg by mouth every morning.     Marland Kitchen amLODipine (NORVASC) 2.5 MG tablet Take 2.5 mg by mouth every morning.   0  . HYDROcodone-acetaminophen (NORCO) 5-325 MG per tablet Take 1-2 tablets by mouth every 4 (four) hours as needed. (Patient not taking: Reported on 11/27/2014) 30 tablet 0   No current facility-administered medications for this visit.    SURGICAL HISTORY:  Past Surgical History  Procedure Laterality Date  . Inguinal hernia repair      bilateral  . Upper gastrointestinal endoscopy  03/08/2002    esophageal stricture,  hiatal hernia  . Colonoscopy  03/08/2002    diverticulosis, external hemorrhoids  . Lung surgery  2011  . Esophageal dilation      x2  . Throat cancer    . Lymph node biopsy Left 09/26/2014    Procedure: EXCISIONAL BIOPSY LEFT AXILLARY LYMPH NODE;  Surgeon: Coralie Keens, MD;  Location: [facility];  Service: General;  Laterality: Left;    REVIEW OF SYSTEMS:  Constitutional: positive for fatigue Eyes: negative Ears, nose, mouth, throat, and face: negative Respiratory: negative Cardiovascular: negative Gastrointestinal: positive for constipation Genitourinary:negative Integument/breast: negative Hematologic/lymphatic:  negative Musculoskeletal:positive for neck pain Neurological: negative Behavioral/Psych: negative Endocrine: negative Allergic/Immunologic: negative   PHYSICAL EXAMINATION: General appearance: alert, cooperative, fatigued and no distress Head: Normocephalic, without obvious abnormality, atraumatic Neck: no adenopathy, no JVD, supple, symmetrical, trachea midline and thyroid not enlarged, symmetric, no tenderness/mass/nodules Lymph nodes: Axillary adenopathy: 2-3 cm palpable left axillary lymphadenopathy Resp: clear to auscultation bilaterally Back: symmetric, no curvature. ROM normal. No CVA tenderness. Cardio: regular rate and rhythm, S1, S2 normal, no murmur, click, rub or gallop GI: soft, non-tender; bowel sounds normal; no masses,  no organomegaly Extremities: extremities normal, atraumatic, no cyanosis or edema Neurologic: Alert and oriented X 3, normal strength and tone. Normal symmetric reflexes. Normal coordination and gait  ECOG PERFORMANCE STATUS: 1 - Symptomatic but completely ambulatory  Blood pressure 109/57, pulse 81, temperature 98.5 F (36.9 C), resp. rate 20, height 5\' 11"  (1.803 m), weight 189 lb 14.4 oz (86.138 kg), SpO2 94 %.  LABORATORY DATA: Lab Results  Component Value Date   WBC 21.8* 01/30/2015   HGB 9.3* 01/30/2015   HCT 30.5* 01/30/2015   MCV 80.4 01/30/2015   PLT 139* 01/30/2015      Chemistry      Component Value Date/Time   NA 143 01/30/2015 1053   NA 139 01/01/2015 1000   K 4.0 01/30/2015 1053   K 3.6 01/01/2015 1000   CL 107 01/01/2015 1000   CO2 27 01/30/2015 1053   CO2 28 01/01/2015 1000   BUN 12.2 01/30/2015 1053   BUN 13 01/01/2015 1000   CREATININE 1.1 01/30/2015 1053   CREATININE 0.91 01/01/2015 1000      Component Value Date/Time   CALCIUM 9.4 01/30/2015 1053   CALCIUM 9.0 01/01/2015 1000   ALKPHOS 114 01/30/2015 1053   ALKPHOS 50 12/19/2014 1303   AST 14 01/30/2015 1053   AST 20 12/19/2014 1303   ALT 13 01/30/2015 1053    ALT 14* 12/19/2014 1303   BILITOT 0.31 01/30/2015 1053   BILITOT 0.4 12/19/2014 1303       RADIOGRAPHIC STUDIES: Ir Fluoro Guide Cv Line Right  01/01/2015   CLINICAL DATA:  History of recurrent Hodgkin's lymphoma and poor venous access. The patient requires a porta cath.  EXAM: IMPLANTED PORT A CATH PLACEMENT WITH ULTRASOUND AND FLUOROSCOPIC GUIDANCE  ANESTHESIA/SEDATION: 2.5 Mg IV Versed; 50 mcg IV Fentanyl  Total Moderate Sedation Time:  30 minutes  Additional Medications: 2 g IV Ancef. As antibiotic prophylaxis, Ancef was ordered pre-procedure and administered intravenously within one hour of incision.  FLUOROSCOPY TIME:  24 seconds  PROCEDURE: The procedure, risks, benefits, and alternatives were explained to the patient. Questions regarding the procedure were encouraged and answered. The patient understands and consents to the procedure. A time-out was performed prior to the procedure.  The right neck and chest were prepped with chlorhexidine in a sterile fashion, and a sterile drape was applied covering the operative field. Maximum barrier  sterile technique with sterile gowns and gloves were used for the procedure. Local anesthesia was provided with 1% lidocaine.  After creating a small venotomy incision, a 21 gauge needle was advanced into the right internal jugular vein under direct, real-time ultrasound guidance. Ultrasound image documentation was performed. After securing guidewire access, an 8 Fr dilator was placed. A J-wire was kinked to measure appropriate catheter length.  A subcutaneous port pocket was then created along the upper chest wall utilizing sharp and blunt dissection. Portable cautery was utilized. The pocket was irrigated with sterile saline.  A single lumen power injectable port was chosen for placement. The 8 Fr catheter was tunneled from the port pocket site to the venotomy incision. The port was placed in the pocket. External catheter was trimmed to appropriate length based on  guidewire measurement.  At the venotomy, an 8 Fr peel-away sheath was placed over a guidewire. The catheter was then placed through the sheath and the sheath removed. Final catheter positioning was confirmed and documented with a fluoroscopic spot image. The port was accessed with a needle and aspirated and flushed with heparinized saline. The needle was removed.  The venotomy and port pocket incisions were closed with subcutaneous 3-0 Monocryl and subcuticular 4-0 Vicryl. Dermabond was applied to both incisions.  COMPLICATIONS: None  FINDINGS: After catheter placement, the tip lies at the cavoatrial junction. The catheter aspirates normally and is ready for immediate use.  IMPRESSION: Placement of single lumen port a cath via right internal jugular vein. The catheter tip lies at the cavoatrial junction. A power injectable port a cath was placed and is ready for immediate use.   Electronically Signed   By: Aletta Edouard M.D.   On: 01/01/2015 16:16   Ir US Guide Vasc Access Right  01/01/2015   CLINICAL DATA:  History of recurrent Hodgkin's lymphoma and poor venous access. The patient requires a porta cath.  EXAM: IMPLANTED PORT A CATH PLACEMENT WITH ULTRASOUND AND FLUOROSCOPIC GUIDANCE  ANESTHESIA/SEDATION: 2.5 Mg IV Versed; 50 mcg IV Fentanyl  Total Moderate Sedation Time:  30 minutes  Additional Medications: 2 g IV Ancef. As antibiotic prophylaxis, Ancef was ordered pre-procedure and administered intravenously within one hour of incision.  FLUOROSCOPY TIME:  24 seconds  PROCEDURE: The procedure, risks, benefits, and alternatives were explained to the patient. Questions regarding the procedure were encouraged and answered. The patient understands and consents to the procedure. A time-out was performed prior to the procedure.  The right neck and chest were prepped with chlorhexidine in a sterile fashion, and a sterile drape was applied covering the operative field. Maximum barrier sterile technique with sterile  gowns and gloves were used for the procedure. Local anesthesia was provided with 1% lidocaine.  After creating a small venotomy incision, a 21 gauge needle was advanced into the right internal jugular vein under direct, real-time ultrasound guidance. Ultrasound image documentation was performed. After securing guidewire access, an 8 Fr dilator was placed. A J-wire was kinked to measure appropriate catheter length.  A subcutaneous port pocket was then created along the upper chest wall utilizing sharp and blunt dissection. Portable cautery was utilized. The pocket was irrigated with sterile saline.  A single lumen power injectable port was chosen for placement. The 8 Fr catheter was tunneled from the port pocket site to the venotomy incision. The port was placed in the pocket. External catheter was trimmed to appropriate length based on guidewire measurement.  At the venotomy, an 8 Fr peel-away sheath was placed  over a guidewire. The catheter was then placed through the sheath and the sheath removed. Final catheter positioning was confirmed and documented with a fluoroscopic spot image. The port was accessed with a needle and aspirated and flushed with heparinized saline. The needle was removed.  The venotomy and port pocket incisions were closed with subcutaneous 3-0 Monocryl and subcuticular 4-0 Vicryl. Dermabond was applied to both incisions.  COMPLICATIONS: None  FINDINGS: After catheter placement, the tip lies at the cavoatrial junction. The catheter aspirates normally and is ready for immediate use.  IMPRESSION: Placement of single lumen port a cath via right internal jugular vein. The catheter tip lies at the cavoatrial junction. A power injectable port a cath was placed and is ready for immediate use.   Electronically Signed   By: Aletta Edouard M.D.   On: 01/01/2015 16:16    ASSESSMENT AND PLAN: This is a very pleasant 81 years old African-American male with recurrent nodular lymphocyte predominant  Hodgkin lymphoma presenting with large left axillary lymphadenopathy in addition to prevascular lymphadenopathy seen on the previous scan. The patient is currently undergoing systemic chemotherapy with ABVD status post 1 full cycle. He is tolerating treatment well so far with few issues.  I advised he use a stool softener daily such as colace or sennakot daily, to prevent waiting until he is seriously constipated and in need of milk of magnesia. He will proceed with day 1, cycle 2 as planned today. I advised him to continue to hold his norvasc 2.5mg  daily. His blood pressures are running low despite not being on it for 1 week.  He will increase his fluid intake. He will have weekly labs and return for a follow-up visit in 2 weeks for reevaluation and management of any adverse effect of his treatment.  He was advised to call immediately if he has any concerning symptoms in the interval. The patient voices understanding of current disease status and treatment options and is in agreement with the current care plan.  All questions were answered. The patient knows to call the clinic with any problems, questions or concerns. We can certainly see the patient much sooner if necessary.   Laurie Panda, NP 01/30/2015

## 2015-02-06 ENCOUNTER — Other Ambulatory Visit (HOSPITAL_BASED_OUTPATIENT_CLINIC_OR_DEPARTMENT_OTHER): Payer: Medicare Other

## 2015-02-06 DIAGNOSIS — C8194 Hodgkin lymphoma, unspecified, lymph nodes of axilla and upper limb: Secondary | ICD-10-CM

## 2015-02-06 DIAGNOSIS — C8190 Hodgkin lymphoma, unspecified, unspecified site: Secondary | ICD-10-CM

## 2015-02-06 LAB — COMPREHENSIVE METABOLIC PANEL (CC13)
ALBUMIN: 3 g/dL — AB (ref 3.5–5.0)
ALT: 9 U/L (ref 0–55)
AST: 10 U/L (ref 5–34)
Alkaline Phosphatase: 57 U/L (ref 40–150)
Anion Gap: 6 mEq/L (ref 3–11)
BILIRUBIN TOTAL: 0.39 mg/dL (ref 0.20–1.20)
BUN: 13.4 mg/dL (ref 7.0–26.0)
CO2: 25 meq/L (ref 22–29)
CREATININE: 1 mg/dL (ref 0.7–1.3)
Calcium: 9 mg/dL (ref 8.4–10.4)
Chloride: 110 mEq/L — ABNORMAL HIGH (ref 98–109)
EGFR: 85 mL/min/{1.73_m2} — ABNORMAL LOW (ref >90)
GLUCOSE: 128 mg/dL (ref 70–140)
Potassium: 4.2 mEq/L (ref 3.5–5.1)
SODIUM: 140 meq/L (ref 136–145)
TOTAL PROTEIN: 5.9 g/dL — AB (ref 6.4–8.3)

## 2015-02-06 LAB — CBC WITH DIFFERENTIAL/PLATELET
BASO%: 0.9 % (ref 0.0–2.0)
Basophils Absolute: 0 10*3/uL (ref 0.0–0.1)
EOS%: 0.1 % (ref 0.0–7.0)
Eosinophils Absolute: 0 10*3/uL (ref 0.0–0.5)
HCT: 27.5 % — ABNORMAL LOW (ref 38.4–49.9)
HEMOGLOBIN: 8.4 g/dL — AB (ref 13.0–17.1)
LYMPH%: 21.5 % (ref 14.0–49.0)
MCH: 24.2 pg — ABNORMAL LOW (ref 27.2–33.4)
MCHC: 30.5 g/dL — ABNORMAL LOW (ref 32.0–36.0)
MCV: 79.3 fL (ref 79.3–98.0)
MONO#: 0.1 10*3/uL (ref 0.1–0.9)
MONO%: 2.5 % (ref 0.0–14.0)
NEUT%: 75 % (ref 39.0–75.0)
NEUTROS ABS: 2.6 10*3/uL (ref 1.5–6.5)
Platelets: 262 10*3/uL (ref 140–400)
RBC: 3.46 10*6/uL — AB (ref 4.20–5.82)
RDW: 22.2 % — AB (ref 11.0–14.6)
WBC: 3.4 10*3/uL — AB (ref 4.0–10.3)
lymph#: 0.7 10*3/uL — ABNORMAL LOW (ref 0.9–3.3)

## 2015-02-13 ENCOUNTER — Other Ambulatory Visit (HOSPITAL_BASED_OUTPATIENT_CLINIC_OR_DEPARTMENT_OTHER): Payer: Medicare Other

## 2015-02-13 ENCOUNTER — Other Ambulatory Visit: Payer: Medicare Other

## 2015-02-13 ENCOUNTER — Telehealth: Payer: Self-pay | Admitting: Internal Medicine

## 2015-02-13 ENCOUNTER — Ambulatory Visit (HOSPITAL_BASED_OUTPATIENT_CLINIC_OR_DEPARTMENT_OTHER): Payer: Medicare Other | Admitting: Internal Medicine

## 2015-02-13 ENCOUNTER — Other Ambulatory Visit: Payer: Self-pay | Admitting: Medical Oncology

## 2015-02-13 ENCOUNTER — Encounter: Payer: Self-pay | Admitting: Internal Medicine

## 2015-02-13 ENCOUNTER — Ambulatory Visit: Payer: Medicare Other

## 2015-02-13 ENCOUNTER — Ambulatory Visit: Payer: Medicare Other | Admitting: Nurse Practitioner

## 2015-02-13 VITALS — BP 109/52 | HR 88 | Temp 97.5°F | Resp 25 | Ht 71.0 in | Wt 183.9 lb

## 2015-02-13 DIAGNOSIS — R0600 Dyspnea, unspecified: Secondary | ICD-10-CM | POA: Diagnosis not present

## 2015-02-13 DIAGNOSIS — C8194 Hodgkin lymphoma, unspecified, lymph nodes of axilla and upper limb: Secondary | ICD-10-CM

## 2015-02-13 DIAGNOSIS — C8110 Nodular sclerosis classical Hodgkin lymphoma, unspecified site: Secondary | ICD-10-CM

## 2015-02-13 DIAGNOSIS — C8190 Hodgkin lymphoma, unspecified, unspecified site: Secondary | ICD-10-CM

## 2015-02-13 LAB — COMPREHENSIVE METABOLIC PANEL (CC13)
ALK PHOS: 53 U/L (ref 40–150)
ALT: <9 U/L (ref 0–55)
ANION GAP: 5 meq/L (ref 3–11)
AST: 11 U/L (ref 5–34)
Albumin: 3.1 g/dL — ABNORMAL LOW (ref 3.5–5.0)
BUN: 12.7 mg/dL (ref 7.0–26.0)
CO2: 26 meq/L (ref 22–29)
Calcium: 9.3 mg/dL (ref 8.4–10.4)
Chloride: 111 mEq/L — ABNORMAL HIGH (ref 98–109)
Creatinine: 1 mg/dL (ref 0.7–1.3)
EGFR: 82 mL/min/{1.73_m2} — AB (ref >90)
GLUCOSE: 141 mg/dL — AB (ref 70–140)
POTASSIUM: 4.1 meq/L (ref 3.5–5.1)
SODIUM: 142 meq/L (ref 136–145)
Total Bilirubin: 0.32 mg/dL (ref 0.20–1.20)
Total Protein: 6.1 g/dL — ABNORMAL LOW (ref 6.4–8.3)

## 2015-02-13 LAB — CBC WITH DIFFERENTIAL/PLATELET
BASO%: 1 % (ref 0.0–2.0)
Basophils Absolute: 0 10*3/uL (ref 0.0–0.1)
EOS%: 1 % (ref 0.0–7.0)
Eosinophils Absolute: 0 10*3/uL (ref 0.0–0.5)
HCT: 27.4 % — ABNORMAL LOW (ref 38.4–49.9)
HGB: 8.5 g/dL — ABNORMAL LOW (ref 13.0–17.1)
LYMPH%: 21.7 % (ref 14.0–49.0)
MCH: 24.8 pg — ABNORMAL LOW (ref 27.2–33.4)
MCHC: 31 g/dL — ABNORMAL LOW (ref 32.0–36.0)
MCV: 80.3 fL (ref 79.3–98.0)
MONO#: 0.5 10*3/uL (ref 0.1–0.9)
MONO%: 21.1 % — ABNORMAL HIGH (ref 0.0–14.0)
NEUT#: 1.2 10*3/uL — ABNORMAL LOW (ref 1.5–6.5)
NEUT%: 55.2 % (ref 39.0–75.0)
Platelets: 290 10*3/uL (ref 140–400)
RBC: 3.41 10*6/uL — ABNORMAL LOW (ref 4.20–5.82)
RDW: 22.8 % — ABNORMAL HIGH (ref 11.0–14.6)
WBC: 2.3 10*3/uL — ABNORMAL LOW (ref 4.0–10.3)
lymph#: 0.5 10*3/uL — ABNORMAL LOW (ref 0.9–3.3)

## 2015-02-13 LAB — LACTATE DEHYDROGENASE (CC13): LDH: 229 U/L (ref 125–245)

## 2015-02-13 LAB — URIC ACID (CC13): Uric Acid, Serum: 5.4 mg/dl (ref 2.6–7.4)

## 2015-02-13 NOTE — Progress Notes (Signed)
[facility] [phone]   Fax:(336) [phone]  OFFICE PROGRESS NOTE  Kandice Hams, MD [address] [place] [address]  Principle Diagnosis: Hodgkin's lymphoma, initially diagnosed in 1979,recurrence in April 2011  Prior Therapy: Status post radiation 59 CT Y without systemic chemotherapy  Current therapy: Systemic chemotherapy with ABVD on days 1 and 15 every 4 weeks. Status post 1 cycle and day 1 of cycle #2.  INTERVAL HISTORY: Jim Beck 81 y.o. male returns to the clinic today for follow-up visit. The patient is feeling fine today with no specific complaints except for shortness of breath started recently and his oxygen saturation was down to 83% with ambulation on room air and 88% at rest. He was started on 2 L of oxygen and his oxygen saturation improved to 99% with ambulation. He tolerated the first cycle of his systemic chemotherapy fairly well with no significant adverse effects except for mild fatigue. He denied having any significant fever or chills. He has no nausea or vomiting. The patient denied having any significant chest pain, shortness of breath, cough or hemoptysis. No significant weight loss or night sweats. He was supposed to start day 15 of the second cycle today.  MEDICAL HISTORY: Past Medical History  Diagnosis Date  . COPD (chronic obstructive pulmonary disease) ([facility])   . Tobacco abuse   . Hypertension   . Hyperlipemia   . GERD (gastroesophageal reflux disease)     large hiatal hernia and stricture  . Hx of blood diseases     bullous blood disease  . PUD (peptic ulcer disease)   . BPH (benign prostatic hypertrophy)   . History of alcohol abuse   . Heart murmur   . Diverticulosis   . External hemorrhoids   . Arthritis   . Myocardial infarction ([facility])   . Substance abuse   . Personal history of colonic polyps - adenomas 11/30/2013    12 mm and diminutive adenoma - no recall planned (age)  . Shortness  of breath dyspnea     with exertion  . Depression   . Constipation   . History of hiatal hernia     per notes  . Hodgkin lymphoma ([facility]) 07/2009    head and neck and throat    ALLERGIES:  is allergic to aspirin.  MEDICATIONS:  Current Outpatient Prescriptions  Medication Sig Dispense Refill  . aspirin EC 81 MG tablet Take 81 mg by mouth every morning.     . lidocaine-prilocaine (EMLA) cream Apply 1 application topically as needed. 30 g 1  . allopurinol (ZYLOPRIM) 100 MG tablet Take 1 tablet (100 mg total) by mouth 2 (two) times daily. (Patient not taking: Reported on 02/13/2015) 60 tablet 2  . amLODipine (NORVASC) 2.5 MG tablet Take 2.5 mg by mouth every morning.   0  . HYDROcodone-acetaminophen (NORCO) 5-325 MG per tablet Take 1-2 tablets by mouth every 4 (four) hours as needed. (Patient not taking: Reported on 11/27/2014) 30 tablet 0  . prochlorperazine (COMPAZINE) 10 MG tablet take 1 tablet by mouth every 6 hours if needed for nausea and vomiting (Patient not taking: Reported on 02/13/2015) 30 tablet 1  . rosuvastatin (CRESTOR) 10 MG tablet Take 10 mg by mouth every morning.      No current facility-administered medications for this visit.    SURGICAL HISTORY:  Past Surgical History  Procedure Laterality Date  . Inguinal hernia repair      bilateral  . Upper  gastrointestinal endoscopy  03/08/2002    esophageal stricture, hiatal hernia  . Colonoscopy  03/08/2002    diverticulosis, external hemorrhoids  . Lung surgery  2011  . Esophageal dilation      x2  . Throat cancer    . Lymph node biopsy Left 09/26/2014    Procedure: EXCISIONAL BIOPSY LEFT AXILLARY LYMPH NODE;  Surgeon: Coralie Keens, MD;  Location: [facility];  Service: General;  Laterality: Left;    REVIEW OF SYSTEMS:  Constitutional: positive for fatigue Respiratory: positive for dyspnea on exertion   PHYSICAL EXAMINATION: General appearance: alert, cooperative, fatigued and no distress Head: Normocephalic, without  obvious abnormality, atraumatic Neck: no adenopathy, no JVD, supple, symmetrical, trachea midline and thyroid not enlarged, symmetric, no tenderness/mass/nodules Lymph nodes: Axillary adenopathy: 2-3 cm palpable left axillary lymphadenopathy Resp: clear to auscultation bilaterally Back: symmetric, no curvature. ROM normal. No CVA tenderness. Cardio: regular rate and rhythm, S1, S2 normal, no murmur, click, rub or gallop GI: soft, non-tender; bowel sounds normal; no masses,  no organomegaly Extremities: extremities normal, atraumatic, no cyanosis or edema Neurologic: Alert and oriented X 3, normal strength and tone. Normal symmetric reflexes. Normal coordination and gait  ECOG PERFORMANCE STATUS: 1 - Symptomatic but completely ambulatory  Blood pressure 109/52, pulse 88, temperature 97.5 F (36.4 C), temperature source Oral, resp. rate 25, height 5\' 11"  (1.803 m), weight 183 lb 14.4 oz (83.416 kg), SpO2 99 %.  LABORATORY DATA: Lab Results  Component Value Date   WBC 2.3* 02/13/2015   HGB 8.5* 02/13/2015   HCT 27.4* 02/13/2015   MCV 80.3 02/13/2015   PLT 290 02/13/2015      Chemistry      Component Value Date/Time   NA 142 02/13/2015 0958   NA 139 01/01/2015 1000   K 4.1 02/13/2015 0958   K 3.6 01/01/2015 1000   CL 107 01/01/2015 1000   CO2 26 02/13/2015 0958   CO2 28 01/01/2015 1000   BUN 12.7 02/13/2015 0958   BUN 13 01/01/2015 1000   CREATININE 1.0 02/13/2015 0958   CREATININE 0.91 01/01/2015 1000      Component Value Date/Time   CALCIUM 9.3 02/13/2015 0958   CALCIUM 9.0 01/01/2015 1000   ALKPHOS 53 02/13/2015 0958   ALKPHOS 50 12/19/2014 1303   AST 11 02/13/2015 0958   AST 20 12/19/2014 1303   ALT <9 02/13/2015 0958   ALT 14* 12/19/2014 1303   BILITOT 0.32 02/13/2015 0958   BILITOT 0.4 12/19/2014 1303       RADIOGRAPHIC STUDIES: No results found.  ASSESSMENT AND PLAN: This is a very pleasant 81 years old African-American male with recurrent nodular  lymphocyte predominant Hodgkin lymphoma presenting with large left axillary lymphadenopathy in addition to prevascular lymphadenopathy seen on the previous scan. The patient is currently undergoing systemic chemotherapy with ABVD status post 1 cycle and day 1 of cycle #2. The patient has been tolerating his treatment well except for the worsening dyspnea recently. I recommended for him to hold his chemotherapy for now until we reevaluate his pulmonary function. I will order repeat pulmonary function test to rule out any change after treatment with bleomycin. I would see the patient back for follow-up visit in 2 weeks for reevaluation and management of any adverse effect of his treatment.  He was advised to call immediately if he has any concerning symptoms in the interval. The patient voices understanding of current disease status and treatment options and is in agreement with the current care plan.  All questions were answered. The patient knows to call the clinic with any problems, questions or concerns. We can certainly see the patient much sooner if necessary.  Disclaimer: This note was dictated with voice recognition software. Similar sounding words can inadvertently be transcribed and may not be corrected upon review.

## 2015-02-13 NOTE — Telephone Encounter (Signed)
Gave adn printed appt sched and avs for pt for OCT and NOV °

## 2015-02-13 NOTE — Progress Notes (Signed)
Oxygen sat 83% ambulating room air , at rest on room air  88% ,  Oxygen 2 liters applied and o2 sat improved to 99% while ambulating .

## 2015-02-20 ENCOUNTER — Ambulatory Visit (HOSPITAL_COMMUNITY)
Admission: RE | Admit: 2015-02-20 | Discharge: 2015-02-20 | Disposition: A | Discharge status: Home / Self Care | Payer: Medicare Other | Source: Ambulatory Visit | Attending: Internal Medicine | Admitting: Internal Medicine

## 2015-02-20 ENCOUNTER — Other Ambulatory Visit (HOSPITAL_BASED_OUTPATIENT_CLINIC_OR_DEPARTMENT_OTHER): Payer: Medicare Other

## 2015-02-20 DIAGNOSIS — F17210 Nicotine dependence, cigarettes, uncomplicated: Secondary | ICD-10-CM | POA: Diagnosis not present

## 2015-02-20 DIAGNOSIS — C8194 Hodgkin lymphoma, unspecified, lymph nodes of axilla and upper limb: Secondary | ICD-10-CM | POA: Diagnosis not present

## 2015-02-20 DIAGNOSIS — C8110 Nodular sclerosis classical Hodgkin lymphoma, unspecified site: Secondary | ICD-10-CM

## 2015-02-20 DIAGNOSIS — C8190 Hodgkin lymphoma, unspecified, unspecified site: Secondary | ICD-10-CM

## 2015-02-20 DIAGNOSIS — R05 Cough: Secondary | ICD-10-CM | POA: Diagnosis not present

## 2015-02-20 DIAGNOSIS — R0609 Other forms of dyspnea: Secondary | ICD-10-CM | POA: Insufficient documentation

## 2015-02-20 LAB — PULMONARY FUNCTION TEST
DL/VA % PRED: 55 %
DL/VA: 2.55 ml/min/mmHg/L
DLCO COR % PRED: 35 %
DLCO COR: 11.62 ml/min/mmHg
DLCO unc % pred: 27 %
DLCO unc: 8.97 ml/min/mmHg
FEF 25-75 POST: 0.6 L/s
FEF 25-75 Pre: 0.54 L/sec
FEF2575-%CHANGE-POST: 12 %
FEF2575-%PRED-PRE: 25 %
FEF2575-%Pred-Post: 29 %
FEV1-%CHANGE-POST: 7 %
FEV1-%Pred-Post: 59 %
FEV1-%Pred-Pre: 54 %
FEV1-Post: 1.58 L
FEV1-Pre: 1.46 L
FEV1FVC-%CHANGE-POST: 10 %
FEV1FVC-%Pred-Pre: 53 %
FEV6-%Change-Post: 1 %
FEV6-%PRED-PRE: 90 %
FEV6-%Pred-Post: 92 %
FEV6-Post: 3.17 L
FEV6-Pre: 3.12 L
FEV6FVC-%Change-Post: 4 %
FEV6FVC-%Pred-Post: 95 %
FEV6FVC-%Pred-Pre: 91 %
FVC-%Change-Post: -2 %
FVC-%PRED-POST: 97 %
FVC-%PRED-PRE: 99 %
FVC-POST: 3.55 L
FVC-PRE: 3.64 L
POST FEV6/FVC RATIO: 89 %
PRE FEV1/FVC RATIO: 40 %
Post FEV1/FVC ratio: 44 %
Pre FEV6/FVC Ratio: 86 %
RV % pred: 94 %
RV: 2.5 L
TLC % pred: 89 %
TLC: 6.3 L

## 2015-02-20 LAB — CBC WITH DIFFERENTIAL/PLATELET
BASO%: 0.7 % (ref 0.0–2.0)
BASOS ABS: 0 10*3/uL (ref 0.0–0.1)
EOS ABS: 0 10*3/uL (ref 0.0–0.5)
EOS%: 0.9 % (ref 0.0–7.0)
HEMATOCRIT: 28.3 % — AB (ref 38.4–49.9)
HEMOGLOBIN: 8.5 g/dL — AB (ref 13.0–17.1)
LYMPH%: 26.2 % (ref 14.0–49.0)
MCH: 24.4 pg — AB (ref 27.2–33.4)
MCHC: 30.1 g/dL — ABNORMAL LOW (ref 32.0–36.0)
MCV: 81.2 fL (ref 79.3–98.0)
MONO#: 1 10*3/uL — ABNORMAL HIGH (ref 0.1–0.9)
MONO%: 19.1 % — AB (ref 0.0–14.0)
NEUT#: 2.8 10*3/uL (ref 1.5–6.5)
NEUT%: 53.1 % (ref 39.0–75.0)
PLATELETS: 331 10*3/uL (ref 140–400)
RBC: 3.48 10*6/uL — ABNORMAL LOW (ref 4.20–5.82)
RDW: 24.5 % — AB (ref 11.0–14.6)
WBC: 5.3 10*3/uL (ref 4.0–10.3)
lymph#: 1.4 10*3/uL (ref 0.9–3.3)

## 2015-02-20 LAB — COMPREHENSIVE METABOLIC PANEL (CC13)
ALBUMIN: 3.2 g/dL — AB (ref 3.5–5.0)
ALK PHOS: 58 U/L (ref 40–150)
ALT: 12 U/L (ref 0–55)
AST: 13 U/L (ref 5–34)
Anion Gap: 5 mEq/L (ref 3–11)
BUN: 13.5 mg/dL (ref 7.0–26.0)
CALCIUM: 9.6 mg/dL (ref 8.4–10.4)
CO2: 26 mEq/L (ref 22–29)
Chloride: 112 mEq/L — ABNORMAL HIGH (ref 98–109)
Creatinine: 1 mg/dL (ref 0.7–1.3)
EGFR: 82 mL/min/{1.73_m2} — AB (ref >90)
Glucose: 110 mg/dl (ref 70–140)
POTASSIUM: 4.4 meq/L (ref 3.5–5.1)
Sodium: 143 mEq/L (ref 136–145)
Total Bilirubin: <0.3 mg/dL (ref 0.20–1.20)
Total Protein: 6.3 g/dL — ABNORMAL LOW (ref 6.4–8.3)

## 2015-02-20 LAB — URIC ACID (CC13): URIC ACID, SERUM: 4.9 mg/dL (ref 2.6–7.4)

## 2015-02-20 MED ORDER — ALBUTEROL SULFATE (2.5 MG/3ML) 0.083% IN NEBU
2.5000 mg | INHALATION_SOLUTION | Freq: Once | RESPIRATORY_TRACT | Status: AC
Start: 2015-02-20 — End: 2015-02-20
  Administered 2015-02-20: 2.5 mg via RESPIRATORY_TRACT

## 2015-02-26 ENCOUNTER — Ambulatory Visit (HOSPITAL_BASED_OUTPATIENT_CLINIC_OR_DEPARTMENT_OTHER): Payer: Medicare Other | Admitting: Physician Assistant

## 2015-02-26 ENCOUNTER — Other Ambulatory Visit (HOSPITAL_BASED_OUTPATIENT_CLINIC_OR_DEPARTMENT_OTHER): Payer: Medicare Other

## 2015-02-26 VITALS — BP 128/83 | HR 88 | Temp 98.4°F | Resp 18 | Ht 71.0 in | Wt 183.8 lb

## 2015-02-26 DIAGNOSIS — Z5111 Encounter for antineoplastic chemotherapy: Secondary | ICD-10-CM

## 2015-02-26 DIAGNOSIS — C8190 Hodgkin lymphoma, unspecified, unspecified site: Secondary | ICD-10-CM

## 2015-02-26 DIAGNOSIS — C8104 Nodular lymphocyte predominant Hodgkin lymphoma, lymph nodes of axilla and upper limb: Secondary | ICD-10-CM

## 2015-02-26 DIAGNOSIS — R0602 Shortness of breath: Secondary | ICD-10-CM | POA: Diagnosis not present

## 2015-02-26 DIAGNOSIS — C8170 Other classical Hodgkin lymphoma, unspecified site: Secondary | ICD-10-CM

## 2015-02-26 LAB — COMPREHENSIVE METABOLIC PANEL (CC13)
ALBUMIN: 3.3 g/dL — AB (ref 3.5–5.0)
ALT: 16 U/L (ref 0–55)
ANION GAP: 8 meq/L (ref 3–11)
AST: 15 U/L (ref 5–34)
Alkaline Phosphatase: 65 U/L (ref 40–150)
BILIRUBIN TOTAL: 0.39 mg/dL (ref 0.20–1.20)
BUN: 11.5 mg/dL (ref 7.0–26.0)
CALCIUM: 9.7 mg/dL (ref 8.4–10.4)
CO2: 25 mEq/L (ref 22–29)
CREATININE: 0.9 mg/dL (ref 0.7–1.3)
Chloride: 108 mEq/L (ref 98–109)
EGFR: 89 mL/min/{1.73_m2} — ABNORMAL LOW (ref >90)
Glucose: 109 mg/dl (ref 70–140)
Potassium: 4.3 mEq/L (ref 3.5–5.1)
Sodium: 141 mEq/L (ref 136–145)
TOTAL PROTEIN: 6.8 g/dL (ref 6.4–8.3)

## 2015-02-26 LAB — CBC WITH DIFFERENTIAL/PLATELET
BASO%: 1.1 % (ref 0.0–2.0)
Basophils Absolute: 0.1 10*3/uL (ref 0.0–0.1)
EOS%: 0.8 % (ref 0.0–7.0)
Eosinophils Absolute: 0.1 10*3/uL (ref 0.0–0.5)
HCT: 31.2 % — ABNORMAL LOW (ref 38.4–49.9)
HGB: 9.4 g/dL — ABNORMAL LOW (ref 13.0–17.1)
LYMPH%: 22.1 % (ref 14.0–49.0)
MCH: 24.3 pg — ABNORMAL LOW (ref 27.2–33.4)
MCHC: 30.2 g/dL — ABNORMAL LOW (ref 32.0–36.0)
MCV: 80.6 fL (ref 79.3–98.0)
MONO#: 1 10*3/uL — ABNORMAL HIGH (ref 0.1–0.9)
MONO%: 14 % (ref 0.0–14.0)
NEUT#: 4.6 10*3/uL (ref 1.5–6.5)
NEUT%: 62 % (ref 39.0–75.0)
Platelets: 276 10*3/uL (ref 140–400)
RBC: 3.87 10*6/uL — ABNORMAL LOW (ref 4.20–5.82)
RDW: 24.3 % — ABNORMAL HIGH (ref 11.0–14.6)
WBC: 7.4 10*3/uL (ref 4.0–10.3)
lymph#: 1.6 10*3/uL (ref 0.9–3.3)

## 2015-02-26 NOTE — Progress Notes (Signed)
[place] [phone]   [phone]  OFFICE PROGRESS NOTE  Kandice Hams, MD [address] [place] [address]  Principle Diagnosis: Hodgkin's lymphoma, initially diagnosed in 1979,recurrence in April 2011  Prior Therapy: Status post radiation 10 CT Y without systemic chemotherapy  Current therapy: Systemic chemotherapy with ABVD on days 1 and 15 every 4 weeks. Status post 1 cycle and day 1 of cycle #2.  INTERVAL HISTORY: Jim Beck 81 y.o. male returns to the clinic today for follow-up visit. He is pretty much better, his respiratory status has improved. The patient had PFTs performed as an outpatient, which showed stable disease. He tolerated the first cycle of his systemic chemotherapy fairly well with no significant adverse effects except for mild fatigue. He denied having any significant fever or chills. He has no nausea or vomiting. The patient denied having any significant chest pain, shortness of breath, cough or hemoptysis. No significant weight loss or night sweats. His appetite is normal. He is ambulating frequently as tolerated. He is to undergo day 15 cycle 2 of chemotherapy as planned on 02/27/15  MEDICAL HISTORY: Past Medical History  Diagnosis Date  . COPD (chronic obstructive pulmonary disease) ([place])   . Tobacco abuse   . Hypertension   . Hyperlipemia   . GERD (gastroesophageal reflux disease)     large hiatal hernia and stricture  . Hx of blood diseases     bullous blood disease  . PUD (peptic ulcer disease)   . BPH (benign prostatic hypertrophy)   . History of alcohol abuse   . Heart murmur   . Diverticulosis   . External hemorrhoids   . Arthritis   . Myocardial infarction ([place])   . Substance abuse   . Personal history of colonic polyps - adenomas 11/30/2013    12 mm and diminutive adenoma - no recall planned (age)  . Shortness of breath dyspnea     with exertion  . Depression   . Constipation     . History of hiatal hernia     per notes  . Hodgkin lymphoma ([place]) 07/2009    head and neck and throat    ALLERGIES:  is allergic to aspirin.  MEDICATIONS:  Current Outpatient Prescriptions  Medication Sig Dispense Refill  . allopurinol (ZYLOPRIM) 100 MG tablet Take 1 tablet (100 mg total) by mouth 2 (two) times daily. (Patient not taking: Reported on 02/13/2015) 60 tablet 2  . amLODipine (NORVASC) 2.5 MG tablet Take 2.5 mg by mouth every morning.   0  . aspirin EC 81 MG tablet Take 81 mg by mouth every morning.     Marland Kitchen HYDROcodone-acetaminophen (NORCO) 5-325 MG per tablet Take 1-2 tablets by mouth every 4 (four) hours as needed. (Patient not taking: Reported on 11/27/2014) 30 tablet 0  . lidocaine-prilocaine (EMLA) cream Apply 1 application topically as needed. 30 g 1  . prochlorperazine (COMPAZINE) 10 MG tablet take 1 tablet by mouth every 6 hours if needed for nausea and vomiting (Patient not taking: Reported on 02/13/2015) 30 tablet 1  . rosuvastatin (CRESTOR) 10 MG tablet Take 10 mg by mouth every morning.      No current facility-administered medications for this visit.    SURGICAL HISTORY:  Past Surgical History  Procedure Laterality Date  . Inguinal hernia repair      bilateral  . Upper gastrointestinal endoscopy  03/08/2002    esophageal stricture, hiatal hernia  . Colonoscopy  03/08/2002  diverticulosis, external hemorrhoids  . Lung surgery  2011  . Esophageal dilation      x2  . Throat cancer    . Lymph node biopsy Left 09/26/2014    Procedure: EXCISIONAL BIOPSY LEFT AXILLARY LYMPH NODE;  Surgeon: Coralie Keens, MD;  Location: [facility];  Service: General;  Laterality: Left;    REVIEW OF SYSTEMS:  Constitutional: positive for fatigue Respiratory: positive for dyspnea on exertion   PHYSICAL EXAMINATION: General appearance: alert, cooperative, fatigued and no distress Head: Normocephalic, without obvious abnormality, atraumatic Neck: no adenopathy, no JVD, supple,  symmetrical, trachea midline and thyroid not enlarged, symmetric, no tenderness/mass/nodules Lymph nodes: Axillary adenopathy: 2-3 cm palpable left axillary lymphadenopathy Resp: clear to auscultation bilaterally Back: symmetric, no curvature. ROM normal. No CVA tenderness. Cardio: regular rate and rhythm, S1, S2 normal, no murmur, click, rub or gallop GI: soft, non-tender; bowel sounds normal; no masses,  no organomegaly Extremities: extremities normal, atraumatic, no cyanosis or edema Neurologic: Alert and oriented X 3, normal strength and tone. Normal symmetric reflexes. Normal coordination and gait  ECOG PERFORMANCE STATUS: 1 - Symptomatic but completely ambulatory  Blood pressure 128/83, pulse 88, temperature 98.4 F (36.9 C), temperature source Oral, resp. rate 18, height 5\' 11"  (1.803 m), weight 183 lb 12.8 oz (83.371 kg), SpO2 96 %.  LABORATORY DATA: CBC Latest Ref Rng 02/26/2015 02/20/2015 02/13/2015  WBC 4.0 - 10.3 10e3/uL 7.4 5.3 2.3(L)  Hemoglobin 13.0 - 17.1 g/dL 9.4(L) 8.5(L) 8.5(L)  Hematocrit 38.4 - 49.9 % 31.2(L) 28.3(L) 27.4(L)  Platelets 140 - 400 10e3/uL 276 331 290    CMP Latest Ref Rng 02/26/2015 02/20/2015 02/13/2015  Glucose 70 - 140 mg/dl 109 110 141(H)  BUN 7.0 - 26.0 mg/dL 11.5 13.5 12.7  Creatinine 0.7 - 1.3 mg/dL 0.9 1.0 1.0  Sodium 136 - 145 mEq/L 141 143 142  Potassium 3.5 - 5.1 mEq/L 4.3 4.4 4.1  Chloride 101 - 111 mmol/L - - -  CO2 22 - 29 mEq/L 25 26 26   Calcium 8.4 - 10.4 mg/dL 9.7 9.6 9.3  Total Protein 6.4 - 8.3 g/dL 6.8 6.3(L) 6.1(L)  Total Bilirubin 0.20 - 1.20 mg/dL 0.39 <0.30 0.32  Alkaline Phos 40 - 150 U/L 65 58 53  AST 5 - 34 U/L 15 13 11   ALT 0 - 55 U/L 16 12 <9     RADIOGRAPHIC STUDIES: No results found.  ASSESSMENT AND PLAN: This is a very pleasant 81 years old African-American male with  Recurrent nodular lymphocyte predominant Hodgkin lymphoma presenting with large left axillary lymphadenopathy in addition to prevascular  lymphadenopathy seen on the previous scan. The patient is currently undergoing systemic chemotherapy with ABVD status post 1 cycle and day 1 of cycle #2. After having been placed on hold due to respiratory symptoms. He had a pulmonary function test, which is stable, thus, we will continuetreatment as planned He Is to undergo day 15, cycle 2 chemotherapy as planned, for 02/27/15  Will see the patient back for follow-up visit in 2 weeks for reevaluation and management of any adverse effect of his treatment.  He was advised to call immediately if he has any concerning symptoms in the interval. The patient voices understanding of current disease status and treatment options and is in agreement with the current care plan.  All questions were answered. The patient knows to call the clinic with any problems, questions or concerns. We can certainly see the patient much sooner if necessary.  This is shared the seeds, the  patient has been seen and evaluated by Dr. Earlie Server   ADDENDUM: Hematology/Oncology Attending: I had a face to face encounter with the patient today. I recommended his care plan. This is a very pleasant 81 years old African-American male with recurrent Hodgkin's lymphoma currently undergoing systemic chemotherapy with ABVD status post 1 cycle and day 1 of cycle #2. His treatment was on hold secondary to increasing shortness of breath. I ordered repeat pulmonary function test which showed no significant deterioration of his lung capacity and no concerning finding for bleomycin pulmonary fibrosis. The patient is feeling much better and his breathing has improved. I recommended for him to proceed with day 15 of cycle #2 as planned today He would come back for follow-up visit in 2 weeks for reevaluation and management of any adverse effect of his treatment before starting cycle #3. The patient was advised to call immediately if he has any concerning symptoms in the interval.  Disclaimer: This  note was dictated with voice recognition software. Similar sounding words can inadvertently be transcribed and may be missed upon review. Eilleen Kempf., MD 02/26/2015

## 2015-02-27 ENCOUNTER — Ambulatory Visit (HOSPITAL_BASED_OUTPATIENT_CLINIC_OR_DEPARTMENT_OTHER): Payer: Medicare Other

## 2015-02-27 VITALS — BP 124/75 | HR 82 | Temp 98.0°F | Resp 20

## 2015-02-27 DIAGNOSIS — Z5111 Encounter for antineoplastic chemotherapy: Secondary | ICD-10-CM | POA: Diagnosis not present

## 2015-02-27 DIAGNOSIS — C8104 Nodular lymphocyte predominant Hodgkin lymphoma, lymph nodes of axilla and upper limb: Secondary | ICD-10-CM | POA: Diagnosis not present

## 2015-02-27 DIAGNOSIS — C8190 Hodgkin lymphoma, unspecified, unspecified site: Secondary | ICD-10-CM

## 2015-02-27 MED ORDER — SODIUM CHLORIDE 0.9 % IV SOLN
Freq: Once | INTRAVENOUS | Status: AC
  Administered 2015-02-27: 15:00:00 via INTRAVENOUS
  Filled 2015-02-27: qty 8

## 2015-02-27 MED ORDER — SODIUM CHLORIDE 0.9 % IV SOLN
5.9000 mg/m2 | Freq: Once | INTRAVENOUS | Status: AC
  Administered 2015-02-27: 12 mg via INTRAVENOUS
  Filled 2015-02-27: qty 12

## 2015-02-27 MED ORDER — SODIUM CHLORIDE 0.9 % IJ SOLN
10.0000 mL | INTRAMUSCULAR | Status: DC | PRN
  Administered 2015-02-27: 10 mL
  Filled 2015-02-27: qty 10

## 2015-02-27 MED ORDER — DOXORUBICIN HCL CHEMO IV INJECTION 2 MG/ML
25.0000 mg/m2 | Freq: Once | INTRAVENOUS | Status: AC
  Administered 2015-02-27: 50 mg via INTRAVENOUS
  Filled 2015-02-27: qty 25

## 2015-02-27 MED ORDER — SODIUM CHLORIDE 0.9 % IV SOLN
10.0000 [IU]/m2 | Freq: Once | INTRAVENOUS | Status: AC
  Administered 2015-02-27: 20 [IU] via INTRAVENOUS
  Filled 2015-02-27: qty 6.67

## 2015-02-27 MED ORDER — SODIUM CHLORIDE 0.9 % IV SOLN
Freq: Once | INTRAVENOUS | Status: AC
  Administered 2015-02-27: 14:00:00 via INTRAVENOUS

## 2015-02-27 MED ORDER — HEPARIN SOD (PORK) LOCK FLUSH 100 UNIT/ML IV SOLN
500.0000 [IU] | Freq: Once | INTRAVENOUS | Status: AC | PRN
  Administered 2015-02-27: 500 [IU]
  Filled 2015-02-27: qty 5

## 2015-02-27 MED ORDER — SODIUM CHLORIDE 0.9 % IV SOLN
375.0000 mg/m2 | Freq: Once | INTRAVENOUS | Status: AC
  Administered 2015-02-27: 760 mg via INTRAVENOUS
  Filled 2015-02-27: qty 38

## 2015-02-27 NOTE — Patient Instructions (Signed)
[facility] Discharge Instructions for Patients Receiving Chemotherapy  Today you received the following chemotherapy agents adriamycin, vinblastine, bleomycin, dacarbazine  To help prevent nausea and vomiting after your treatment, we encourage you to take your nausea medication   If you develop nausea and vomiting that is not controlled by your nausea medication, call the clinic.   BELOW ARE SYMPTOMS THAT SHOULD BE REPORTED IMMEDIATELY:  *FEVER GREATER THAN 100.5 F  *CHILLS WITH OR WITHOUT FEVER  NAUSEA AND VOMITING THAT IS NOT CONTROLLED WITH YOUR NAUSEA MEDICATION  *UNUSUAL SHORTNESS OF BREATH  *UNUSUAL BRUISING OR BLEEDING  TENDERNESS IN MOUTH AND THROAT WITH OR WITHOUT PRESENCE OF ULCERS  *URINARY PROBLEMS  *BOWEL PROBLEMS  UNUSUAL RASH Items with * indicate a potential emergency and should be followed up as soon as possible.  Feel free to call the clinic you have any questions or concerns. The clinic phone number is [phone].  Please show the [facility] at check-in to the Emergency Department and triage nurse.

## 2015-03-01 ENCOUNTER — Ambulatory Visit (INDEPENDENT_AMBULATORY_CARE_PROVIDER_SITE_OTHER): Payer: Medicare Other | Admitting: Emergency Medicine

## 2015-03-01 ENCOUNTER — Telehealth: Payer: Self-pay | Admitting: *Deleted

## 2015-03-01 VITALS — BP 102/60 | HR 82 | Temp 98.1°F | Resp 22

## 2015-03-01 DIAGNOSIS — S0181XA Laceration without foreign body of other part of head, initial encounter: Secondary | ICD-10-CM | POA: Diagnosis not present

## 2015-03-01 DIAGNOSIS — R55 Syncope and collapse: Secondary | ICD-10-CM

## 2015-03-01 NOTE — Progress Notes (Signed)
Subjective:  Patient ID: Jim Beck, male    DOB: [birth year]  Age: 81 y.o. MRN: [id number]  CC: Abrasion; Loss of Consciousness; and Dizziness   HPI Jim Beck presents  he has a history of frequent falling he is under treatment for non-Hodgkin's lymphoma. Currently receiving chemotherapy. Apparently got up and was given a go to the refrigerator but only made it as far as the sink before falling to the ground. Apparently had a syncopal episode and struck his forehead on the floor. No loss of consciousness from the fall. His wife was in the room. He has no acute complaint of headache or other new neurologic disturbance. On home oxygen. He has no chest pain tightness heaviness pressure is sensation of skipped or irregular beats. He has no neurologic or visual symptoms he has no neck pain back pain or other complaint of pain  History Jim Beck has a past medical history of COPD (chronic obstructive pulmonary disease) ([place]); Tobacco abuse; Hypertension; Hyperlipemia; GERD (gastroesophageal reflux disease); blood diseases; PUD (peptic ulcer disease); BPH (benign prostatic hypertrophy); History of alcohol abuse; Heart murmur; Diverticulosis; External hemorrhoids; Arthritis; Myocardial infarction ([place]); Substance abuse; Personal history of colonic polyps - adenomas (11/30/2013); Shortness of breath dyspnea; Depression; Constipation; History of hiatal hernia; and Hodgkin lymphoma ([place]) (07/2009).   He has past surgical history that includes Inguinal hernia repair; Upper gastrointestinal endoscopy (03/08/2002); Colonoscopy (03/08/2002); Lung surgery (2011); Esophageal dilation; Throat cancer; and Lymph node biopsy (Left, 09/26/2014).   His  family history includes Cancer in his brother; Lung cancer in his brother; Throat cancer in his sister. There is no history of Colon cancer, Esophageal cancer, Rectal cancer, or Stomach cancer.  He   reports that he quit smoking about 37 years ago. He has never used  smokeless tobacco. He reports that he does not drink alcohol or use illicit drugs.  Outpatient Prescriptions Prior to Visit  Medication Sig Dispense Refill  . allopurinol (ZYLOPRIM) 100 MG tablet Take 1 tablet (100 mg total) by mouth 2 (two) times daily. 60 tablet 2  . amLODipine (NORVASC) 2.5 MG tablet Take 2.5 mg by mouth every morning.   0  . aspirin EC 81 MG tablet Take 81 mg by mouth every morning.     Marland Kitchen HYDROcodone-acetaminophen (NORCO) 5-325 MG per tablet Take 1-2 tablets by mouth every 4 (four) hours as needed. (Patient not taking: Reported on 11/27/2014) 30 tablet 0  . lidocaine-prilocaine (EMLA) cream Apply 1 application topically as needed. (Patient not taking: Reported on 03/01/2015) 30 g 1  . prochlorperazine (COMPAZINE) 10 MG tablet take 1 tablet by mouth every 6 hours if needed for nausea and vomiting (Patient not taking: Reported on 02/13/2015) 30 tablet 1  . rosuvastatin (CRESTOR) 10 MG tablet Take 10 mg by mouth every morning.      No facility-administered medications prior to visit.    Social History   Social History  . Marital Status: Married    Spouse Name: N/A  . Number of Children: 2  . Years of Education: N/A   Occupational History  .     Social History Main Topics  . Smoking status: Former Smoker    Quit date: 04/27/1977  . Smokeless tobacco: Never Used  . Alcohol Use: No  . Drug Use: No  . Sexual Activity: Not Asked   Other Topics Concern  . None   Social History Narrative     Review of Systems  Constitutional: Negative for fever, chills and appetite change.  HENT:  Negative for congestion, ear pain, postnasal drip, sinus pressure and sore throat.   Eyes: Negative for pain and redness.  Respiratory: Negative for cough, shortness of breath and wheezing.   Cardiovascular: Negative for leg swelling.  Gastrointestinal: Negative for nausea, vomiting, abdominal pain, diarrhea, constipation and blood in stool.  Endocrine: Negative for polyuria.    Genitourinary: Negative for dysuria, urgency, frequency and flank pain.  Musculoskeletal: Negative for gait problem.  Skin: Negative for rash.  Neurological: Negative for weakness and headaches.  Psychiatric/Behavioral: Negative for confusion and decreased concentration. The patient is not nervous/anxious.     Objective:  BP 102/60 mmHg  Pulse 82  Temp(Src) 98.1 F (36.7 C) (Oral)  Resp 22  SpO2 91%  Physical Exam  Constitutional: He is oriented to person, place, and time. He appears well-developed and well-nourished. No distress.  HENT:  Head: Normocephalic and atraumatic.  Right Ear: External ear normal.  Left Ear: External ear normal.  Nose: Nose normal.  Eyes: Conjunctivae and EOM are normal. Pupils are equal, round, and reactive to light. No scleral icterus.  Neck: Normal range of motion. Neck supple. No tracheal deviation present.  Cardiovascular: Normal rate, regular rhythm and normal heart sounds.   Pulmonary/Chest: Effort normal. No respiratory distress. He has no wheezes. He has no rales.  Abdominal: He exhibits no mass. There is no tenderness. There is no rebound and no guarding.  Musculoskeletal: He exhibits no edema.  Lymphadenopathy:    He has no cervical adenopathy.  Neurological: He is alert and oriented to person, place, and time. Coordination normal.  Skin: Skin is warm and dry. Laceration noted. No rash noted.  Psychiatric: He has a normal mood and affect. His behavior is normal.    He is a 2 cm laceration on the supraorbital ridge on the right side of his forehead. He has no foreign body.  Assessment & Plan:   Jim Beck was seen today for abrasion, loss of consciousness and dizziness.  Diagnoses and all orders for this visit:  Syncope and collapse  Laceration of forehead, initial encounter   I am having Jim Beck maintain his rosuvastatin, aspirin EC, HYDROcodone-acetaminophen, allopurinol, amLODipine, lidocaine-prilocaine, and prochlorperazine.  No  orders of the defined types were placed in this encounter.   The wound was closed with  Appropriate red flag conditions were discussed with the patient as well as actions that should be taken.  Patient expressed his understanding.  Follow-up: Return if symptoms worsen or fail to improve.  Roselee Culver, MD

## 2015-03-01 NOTE — Patient Instructions (Signed)
Facial Laceration ° A facial laceration is a cut on the face. These injuries can be painful and cause bleeding. Lacerations usually heal quickly, but they need special care to reduce scarring. °DIAGNOSIS  °Your health care provider will take a medical history, ask for details about how the injury occurred, and examine the wound to determine how deep the cut is. °TREATMENT  °Some facial lacerations may not require closure. Others may not be able to be closed because of an increased risk of infection. The risk of infection and the chance for successful closure will depend on various factors, including the amount of time since the injury occurred. °The wound may be cleaned to help prevent infection. If closure is appropriate, pain medicines may be given if needed. Your health care provider will use stitches (sutures), wound glue (adhesive), or skin adhesive strips to repair the laceration. These tools bring the skin edges together to allow for faster healing and a better cosmetic outcome. If needed, you may also be given a tetanus shot. °HOME CARE INSTRUCTIONS °· Only take over-the-counter or prescription medicines as directed by your health care provider. °· Follow your health care provider's instructions for wound care. These instructions will vary depending on the technique used for closing the wound. °For Sutures: °· Keep the wound clean and dry.   °· If you were given a bandage (dressing), you should change it at least once a day. Also change the dressing if it becomes wet or dirty, or as directed by your health care provider.   °· Wash the wound with soap and water 2 times a day. Rinse the wound off with water to remove all soap. Pat the wound dry with a clean towel.   °· After cleaning, apply a thin layer of the antibiotic ointment recommended by your health care provider. This will help prevent infection and keep the dressing from sticking.   °· You may shower as usual after the first 24 hours. Do not soak the  wound in water until the sutures are removed.   °· Get your sutures removed as directed by your health care provider. With facial lacerations, sutures should usually be taken out after 4-5 days to avoid stitch marks.   °· Wait a few days after your sutures are removed before applying any makeup. °For Skin Adhesive Strips: °· Keep the wound clean and dry.   °· Do not get the skin adhesive strips wet. You may bathe carefully, using caution to keep the wound dry.   °· If the wound gets wet, pat it dry with a clean towel.   °· Skin adhesive strips will fall off on their own. You may trim the strips as the wound heals. Do not remove skin adhesive strips that are still stuck to the wound. They will fall off in time.   °For Wound Adhesive: °· You may briefly wet your wound in the shower or bath. Do not soak or scrub the wound. Do not swim. Avoid periods of heavy sweating until the skin adhesive has fallen off on its own. After showering or bathing, gently pat the wound dry with a clean towel.   °· Do not apply liquid medicine, cream medicine, ointment medicine, or makeup to your wound while the skin adhesive is in place. This may loosen the film before your wound is healed.   °· If a dressing is placed over the wound, be careful not to apply tape directly over the skin adhesive. This may cause the adhesive to be pulled off before the wound is healed.   °· Avoid   prolonged exposure to sunlight or tanning lamps while the skin adhesive is in place. °· The skin adhesive will usually remain in place for 5-10 days, then naturally fall off the skin. Do not pick at the adhesive film.   °After Healing: °Once the wound has healed, cover the wound with sunscreen during the day for 1 full year. This can help minimize scarring. Exposure to ultraviolet light in the first year will darken the scar. It can take 1-2 years for the scar to lose its redness and to heal completely.  °SEEK MEDICAL CARE IF: °· You have a fever. °SEEK IMMEDIATE  MEDICAL CARE IF: °· You have redness, pain, or swelling around the wound.   °· You see a yellowish-white fluid (pus) coming from the wound.   °  °This information is not intended to replace advice given to you by your health care provider. Make sure you discuss any questions you have with your health care provider. °  °Document Released: 05/21/2004 Document Revised: 05/04/2014 Document Reviewed: 11/24/2012 °Elsevier Interactive Patient Education ©2016 [employer] ° °

## 2015-03-01 NOTE — Telephone Encounter (Signed)
Pt's daughter Orlena Sheldon called states " He fell and blacked out, when he woke up he was on the floor. I just got here . His head is bleeding and he has a cut above his eye." Daughter denied any visual changes or slurred speech, nausea or vomiting.. Instructed her to take pt to ED for evaluation. [place] verbalized understanding, will take pt now to be seen. No further concerns.

## 2015-03-06 ENCOUNTER — Other Ambulatory Visit (HOSPITAL_BASED_OUTPATIENT_CLINIC_OR_DEPARTMENT_OTHER): Payer: Medicare Other

## 2015-03-06 DIAGNOSIS — C8104 Nodular lymphocyte predominant Hodgkin lymphoma, lymph nodes of axilla and upper limb: Secondary | ICD-10-CM

## 2015-03-06 DIAGNOSIS — C8190 Hodgkin lymphoma, unspecified, unspecified site: Secondary | ICD-10-CM

## 2015-03-06 LAB — CBC WITH DIFFERENTIAL/PLATELET
BASO%: 0.5 % (ref 0.0–2.0)
BASOS ABS: 0 10*3/uL (ref 0.0–0.1)
EOS ABS: 0.2 10*3/uL (ref 0.0–0.5)
EOS%: 3.8 % (ref 0.0–7.0)
HEMATOCRIT: 29 % — AB (ref 38.4–49.9)
HEMOGLOBIN: 8.8 g/dL — AB (ref 13.0–17.1)
LYMPH#: 1.3 10*3/uL (ref 0.9–3.3)
LYMPH%: 32.2 % (ref 14.0–49.0)
MCH: 24 pg — AB (ref 27.2–33.4)
MCHC: 30.3 g/dL — ABNORMAL LOW (ref 32.0–36.0)
MCV: 79.2 fL — AB (ref 79.3–98.0)
MONO#: 0.1 10*3/uL (ref 0.1–0.9)
MONO%: 3 % (ref 0.0–14.0)
NEUT#: 2.4 10*3/uL (ref 1.5–6.5)
NEUT%: 60.5 % (ref 39.0–75.0)
NRBC: 0 % (ref 0–0)
PLATELETS: 222 10*3/uL (ref 140–400)
RBC: 3.66 10*6/uL — ABNORMAL LOW (ref 4.20–5.82)
RDW: 21.9 % — AB (ref 11.0–14.6)
WBC: 4 10*3/uL (ref 4.0–10.3)

## 2015-03-06 LAB — COMPREHENSIVE METABOLIC PANEL (CC13)
ALBUMIN: 3.2 g/dL — AB (ref 3.5–5.0)
ALK PHOS: 53 U/L (ref 40–150)
ALT: 9 U/L (ref 0–55)
ANION GAP: 8 meq/L (ref 3–11)
AST: 10 U/L (ref 5–34)
BILIRUBIN TOTAL: 0.35 mg/dL (ref 0.20–1.20)
BUN: 15.2 mg/dL (ref 7.0–26.0)
CALCIUM: 9.4 mg/dL (ref 8.4–10.4)
CO2: 23 mEq/L (ref 22–29)
Chloride: 111 mEq/L — ABNORMAL HIGH (ref 98–109)
Creatinine: 1 mg/dL (ref 0.7–1.3)
EGFR: 84 mL/min/{1.73_m2} — AB (ref >90)
GLUCOSE: 107 mg/dL (ref 70–140)
Potassium: 4.2 mEq/L (ref 3.5–5.1)
SODIUM: 142 meq/L (ref 136–145)
TOTAL PROTEIN: 6.6 g/dL (ref 6.4–8.3)

## 2015-03-06 LAB — TECHNOLOGIST REVIEW

## 2015-03-06 LAB — URIC ACID (CC13): URIC ACID, SERUM: 4.3 mg/dL (ref 2.6–7.4)

## 2015-03-06 LAB — LACTATE DEHYDROGENASE (CC13): LDH: 204 U/L (ref 125–245)

## 2015-03-12 ENCOUNTER — Other Ambulatory Visit: Payer: Self-pay | Admitting: *Deleted

## 2015-03-12 DIAGNOSIS — C8170 Other classical Hodgkin lymphoma, unspecified site: Secondary | ICD-10-CM

## 2015-03-13 ENCOUNTER — Other Ambulatory Visit (HOSPITAL_BASED_OUTPATIENT_CLINIC_OR_DEPARTMENT_OTHER): Payer: Medicare Other

## 2015-03-13 ENCOUNTER — Ambulatory Visit (HOSPITAL_BASED_OUTPATIENT_CLINIC_OR_DEPARTMENT_OTHER): Payer: Medicare Other | Admitting: Physician Assistant

## 2015-03-13 ENCOUNTER — Ambulatory Visit (HOSPITAL_BASED_OUTPATIENT_CLINIC_OR_DEPARTMENT_OTHER): Payer: Medicare Other

## 2015-03-13 ENCOUNTER — Telehealth: Payer: Self-pay | Admitting: Internal Medicine

## 2015-03-13 VITALS — BP 152/85 | HR 92 | Temp 98.6°F | Resp 18 | Ht 71.0 in | Wt 184.3 lb

## 2015-03-13 DIAGNOSIS — C8104 Nodular lymphocyte predominant Hodgkin lymphoma, lymph nodes of axilla and upper limb: Secondary | ICD-10-CM | POA: Diagnosis not present

## 2015-03-13 DIAGNOSIS — Z5111 Encounter for antineoplastic chemotherapy: Secondary | ICD-10-CM | POA: Diagnosis not present

## 2015-03-13 DIAGNOSIS — C8174 Other classical Hodgkin lymphoma, lymph nodes of axilla and upper limb: Secondary | ICD-10-CM

## 2015-03-13 DIAGNOSIS — R42 Dizziness and giddiness: Secondary | ICD-10-CM | POA: Diagnosis not present

## 2015-03-13 DIAGNOSIS — C8170 Other classical Hodgkin lymphoma, unspecified site: Secondary | ICD-10-CM

## 2015-03-13 DIAGNOSIS — C8114 Nodular sclerosis classical Hodgkin lymphoma, lymph nodes of axilla and upper limb: Secondary | ICD-10-CM

## 2015-03-13 LAB — COMPREHENSIVE METABOLIC PANEL (CC13)
ALT: <9 U/L (ref 0–55)
AST: 12 U/L (ref 5–34)
Albumin: 3.2 g/dL — ABNORMAL LOW (ref 3.5–5.0)
Alkaline Phosphatase: 54 U/L (ref 40–150)
Anion Gap: 10 mEq/L (ref 3–11)
BUN: 13.1 mg/dL (ref 7.0–26.0)
CO2: 22 mEq/L (ref 22–29)
Calcium: 9.4 mg/dL (ref 8.4–10.4)
Chloride: 110 mEq/L — ABNORMAL HIGH (ref 98–109)
Creatinine: 1 mg/dL (ref 0.7–1.3)
EGFR: 85 mL/min/{1.73_m2} — ABNORMAL LOW (ref >90)
Glucose: 110 mg/dl (ref 70–140)
Potassium: 3.9 mEq/L (ref 3.5–5.1)
Sodium: 143 mEq/L (ref 136–145)
Total Bilirubin: 0.46 mg/dL (ref 0.20–1.20)
Total Protein: 6.6 g/dL (ref 6.4–8.3)

## 2015-03-13 LAB — CBC WITH DIFFERENTIAL/PLATELET
BASO%: 1 % (ref 0.0–2.0)
Basophils Absolute: 0 10*3/uL (ref 0.0–0.1)
EOS%: 1.4 % (ref 0.0–7.0)
Eosinophils Absolute: 0 10*3/uL (ref 0.0–0.5)
HCT: 28.4 % — ABNORMAL LOW (ref 38.4–49.9)
HGB: 8.6 g/dL — ABNORMAL LOW (ref 13.0–17.1)
LYMPH%: 28.2 % (ref 14.0–49.0)
MCH: 23.5 pg — ABNORMAL LOW (ref 27.2–33.4)
MCHC: 30.1 g/dL — ABNORMAL LOW (ref 32.0–36.0)
MCV: 78.1 fL — ABNORMAL LOW (ref 79.3–98.0)
MONO#: 0.5 10*3/uL (ref 0.1–0.9)
MONO%: 14.3 % — ABNORMAL HIGH (ref 0.0–14.0)
NEUT#: 1.9 10*3/uL (ref 1.5–6.5)
NEUT%: 55.1 % (ref 39.0–75.0)
Platelets: 256 10*3/uL (ref 140–400)
RBC: 3.64 10*6/uL — ABNORMAL LOW (ref 4.20–5.82)
RDW: 23.3 % — ABNORMAL HIGH (ref 11.0–14.6)
WBC: 3.5 10*3/uL — ABNORMAL LOW (ref 4.0–10.3)
lymph#: 1 10*3/uL (ref 0.9–3.3)

## 2015-03-13 MED ORDER — SODIUM CHLORIDE 0.9 % IV SOLN
Freq: Once | INTRAVENOUS | Status: AC
  Administered 2015-03-13: 12:00:00 via INTRAVENOUS

## 2015-03-13 MED ORDER — SODIUM CHLORIDE 0.9 % IV SOLN
Freq: Once | INTRAVENOUS | Status: AC
  Administered 2015-03-13: 12:00:00 via INTRAVENOUS
  Filled 2015-03-13: qty 8

## 2015-03-13 MED ORDER — SODIUM CHLORIDE 0.9 % IJ SOLN
10.0000 mL | INTRAMUSCULAR | Status: DC | PRN
  Administered 2015-03-13: 10 mL
  Filled 2015-03-13: qty 10

## 2015-03-13 MED ORDER — VINBLASTINE SULFATE CHEMO INJECTION 1 MG/ML
5.9000 mg/m2 | Freq: Once | INTRAVENOUS | Status: AC
  Administered 2015-03-13: 12 mg via INTRAVENOUS
  Filled 2015-03-13: qty 12

## 2015-03-13 MED ORDER — DACARBAZINE 200 MG IV SOLR
375.0000 mg/m2 | Freq: Once | INTRAVENOUS | Status: AC
  Administered 2015-03-13: 760 mg via INTRAVENOUS
  Filled 2015-03-13: qty 38

## 2015-03-13 MED ORDER — DOXORUBICIN HCL CHEMO IV INJECTION 2 MG/ML
25.0000 mg/m2 | Freq: Once | INTRAVENOUS | Status: AC
  Administered 2015-03-13: 50 mg via INTRAVENOUS
  Filled 2015-03-13: qty 25

## 2015-03-13 MED ORDER — HEPARIN SOD (PORK) LOCK FLUSH 100 UNIT/ML IV SOLN
500.0000 [IU] | Freq: Once | INTRAVENOUS | Status: AC | PRN
  Administered 2015-03-13: 500 [IU]
  Filled 2015-03-13: qty 5

## 2015-03-13 MED ORDER — SODIUM CHLORIDE 0.9 % IV SOLN
10.0000 [IU]/m2 | Freq: Once | INTRAVENOUS | Status: AC
  Administered 2015-03-13: 20 [IU] via INTRAVENOUS
  Filled 2015-03-13: qty 6.67

## 2015-03-13 NOTE — Telephone Encounter (Signed)
Gave and printed appt sched and avs for pt for NOV and DEC °

## 2015-03-13 NOTE — Progress Notes (Signed)
[place] [phone]   [phone]  OFFICE PROGRESS NOTE  Kandice Hams, MD [address] [place] [address]  Principle Diagnosis: Hodgkin's lymphoma, initially diagnosed in 1979,recurrence in April 2011  Prior Therapy: Status post radiation 75 CT Y without systemic chemotherapy  Current therapy: Systemic chemotherapy with ABVD on days 1 and 15 every 4 weeks. S/p D15 C2 11/2  INTERVAL HISTORY: Jim Beck 81 y.o. male returns to the clinic today for follow-up visit.  He tolerated the second cycle of his systemic chemotherapy fairly well with no significant adverse effects except for mild fatigue. His main complaint is intermittent dizziness, and headaches. Because of these, he had 1 episode of fall, causing a laceration on the right eyebrow, which required stitching on November 13 at urgent care. He denies any seizures. He denies any blurred vision or double vision. He denied having any significant fever or chills. He has no nausea or vomiting. The patient denied having any significant chest pain, shortness of breath, cough or hemoptysis. No significant weight loss or night sweats. His appetite is normal.He is to undergo day 1 cycle 3 of chemotherapy as planned on 03/13/15  MEDICAL HISTORY: Past Medical History  Diagnosis Date  . COPD (chronic obstructive pulmonary disease) ([place])   . Tobacco abuse   . Hypertension   . Hyperlipemia   . GERD (gastroesophageal reflux disease)     large hiatal hernia and stricture  . Hx of blood diseases     bullous blood disease  . PUD (peptic ulcer disease)   . BPH (benign prostatic hypertrophy)   . History of alcohol abuse   . Heart murmur   . Diverticulosis   . External hemorrhoids   . Arthritis   . Myocardial infarction ([place])   . Substance abuse   . Personal history of colonic polyps - adenomas 11/30/2013    12 mm and diminutive adenoma - no recall planned (age)  . Shortness of  breath dyspnea     with exertion  . Depression   . Constipation   . History of hiatal hernia     per notes  . Hodgkin lymphoma ([place]) 07/2009    head and neck and throat    ALLERGIES:  is allergic to aspirin.  MEDICATIONS:  Current Outpatient Prescriptions  Medication Sig Dispense Refill  . allopurinol (ZYLOPRIM) 100 MG tablet Take 1 tablet (100 mg total) by mouth 2 (two) times daily. 60 tablet 2  . amLODipine (NORVASC) 2.5 MG tablet Take 2.5 mg by mouth every morning.   0  . aspirin EC 81 MG tablet Take 81 mg by mouth every morning.     Marland Kitchen HYDROcodone-acetaminophen (NORCO) 5-325 MG per tablet Take 1-2 tablets by mouth every 4 (four) hours as needed. (Patient not taking: Reported on 11/27/2014) 30 tablet 0  . lidocaine-prilocaine (EMLA) cream Apply 1 application topically as needed. (Patient not taking: Reported on 03/01/2015) 30 g 1  . prochlorperazine (COMPAZINE) 10 MG tablet take 1 tablet by mouth every 6 hours if needed for nausea and vomiting (Patient not taking: Reported on 02/13/2015) 30 tablet 1  . rosuvastatin (CRESTOR) 10 MG tablet Take 10 mg by mouth every morning.      No current facility-administered medications for this visit.    SURGICAL HISTORY:  Past Surgical History  Procedure Laterality Date  . Inguinal hernia repair      bilateral  . Upper gastrointestinal endoscopy  03/08/2002  esophageal stricture, hiatal hernia  . Colonoscopy  03/08/2002    diverticulosis, external hemorrhoids  . Lung surgery  2011  . Esophageal dilation      x2  . Throat cancer    . Lymph node biopsy Left 09/26/2014    Procedure: EXCISIONAL BIOPSY LEFT AXILLARY LYMPH NODE;  Surgeon: Coralie Keens, MD;  Location: [facility];  Service: General;  Laterality: Left;    REVIEW OF SYSTEMS:  Constitutional: positive for fatigue Respiratory: positive for dyspnea on exertion   PHYSICAL EXAMINATION: General appearance: alert, cooperative, fatigued and no distress Head: Normocephalic, without  obvious abnormality, atraumatic Neck: no adenopathy, no JVD, supple, symmetrical, trachea midline and thyroid not enlarged, symmetric, no tenderness/mass/nodules Lymph nodes: Axillary adenopathy: 2-3 cm palpable left axillary lymphadenopathy Resp: clear to auscultation bilaterally Back: symmetric, no curvature. ROM normal. No CVA tenderness. Cardio: regular rate and rhythm, S1, S2 normal, no murmur, click, rub or gallop GI: soft, non-tender; bowel sounds normal; no masses,  no organomegaly Extremities: extremities normal, atraumatic, no cyanosis or edema Neurologic: Alert and oriented X 3, normal strength and tone. Normal symmetric reflexes. Normal coordination and gait  ECOG PERFORMANCE STATUS: 1 - Symptomatic but completely ambulatory  Blood pressure 152/85, pulse 92, temperature 98.6 F (37 C), temperature source Oral, resp. rate 18, height 5\' 11"  (1.803 m), weight 184 lb 4.8 oz (83.598 kg), SpO2 95 %.  LABORATORY DATA: CBC Latest Ref Rng 03/13/2015 03/06/2015 02/26/2015  WBC 4.0 - 10.3 10e3/uL 3.5(L) 4.0 7.4  Hemoglobin 13.0 - 17.1 g/dL 8.6(L) 8.8(L) 9.4(L)  Hematocrit 38.4 - 49.9 % 28.4(L) 29.0(L) 31.2(L)  Platelets 140 - 400 10e3/uL 256 222 276    CMP Latest Ref Rng 03/13/2015 03/06/2015 02/26/2015  Glucose 70 - 140 mg/dl 110 107 109  BUN 7.0 - 26.0 mg/dL 13.1 15.2 11.5  Creatinine 0.7 - 1.3 mg/dL 1.0 1.0 0.9  Sodium 136 - 145 mEq/L 143 142 141  Potassium 3.5 - 5.1 mEq/L 3.9 4.2 4.3  Chloride 101 - 111 mmol/L - - -  CO2 22 - 29 mEq/L 22 23 25   Calcium 8.4 - 10.4 mg/dL 9.4 9.4 9.7  Total Protein 6.4 - 8.3 g/dL 6.6 6.6 6.8  Total Bilirubin 0.20 - 1.20 mg/dL 0.46 0.35 0.39  Alkaline Phos 40 - 150 U/L 54 53 65  AST 5 - 34 U/L 12 10 15   ALT 0 - 55 U/L 9 9 16      RADIOGRAPHIC STUDIES: No results found.  ASSESSMENT AND PLAN: This is a very pleasant 81 years old African-American male with  Recurrent nodular lymphocyte predominant Hodgkin lymphoma presenting with large left  axillary lymphadenopathy in addition to prevascular lymphadenopathy seen on the previous scan. The patient is currently undergoing systemic chemotherapy with ABVD status post 2 cycles. The patient will go ahead with treatment, day 1 cycle 3 today, As his counts are stable. Will see the patient back for follow-up visit in 2 weeks for reevaluation and management of any adverse effect of his treatment.  In the interim, he was instructed to call Dr. Zenia Resides office For evaluation of suspected cardiac origin of his dizziness and headaches. These symptoms were initially present in 2014,At which time, a CT of the head has shown chronic vascular disease. It seems prudent for him to be checked at the cardiologist office He was advised to call immediately if he has any other concerning symptoms in the interval. The patient voices understanding of current disease status and treatment options and is in agreement with the  current care plan.  All questions were answered. The patient knows to call the clinic with any problems, questions or concerns. We can certainly see the patient much sooner if necessary.  This is shared the seeds, the patient has been seen and evaluated by Dr. Earlie Server   ADDENDUM: Hematology/Oncology Attending: I had a face to face encounter with the patient today. I recommended his care plan. This is a very pleasant 81 years old African-American male with recurrent Hodgkin's lymphoma currently undergoing systemic chemotherapy with ABVD status post 1 cycle and day 1 of cycle #2. His treatment was on hold secondary to increasing shortness of breath. I ordered repeat pulmonary function test which showed no significant deterioration of his lung capacity and no concerning finding for bleomycin pulmonary fibrosis. The patient is feeling much better and his breathing has improved. I recommended for him to proceed with day 15 of cycle #2 as planned today He would come back for follow-up visit in 2  weeks for reevaluation and management of any adverse effect of his treatment before starting cycle #3. The patient was advised to call immediately if he has any concerning symptoms in the interval.  Disclaimer: This note was dictated with voice recognition software. Similar sounding words can inadvertently be transcribed and may be missed upon review. Jim Jumbo, PA-C 03/13/2015   ADDENDUM: Hematology/Oncology Attending:  I had a face to face encounter with the patient. I recommended his care plan.  This is a very pleasant 81 years old African-American male with history of recurrent Hodgkin's lymphoma currently undergoing systemic chemotherapy with ABVD status post 2 cycles. The patient is currently tolerating his treatment fairly well with no significant adverse effects. I recommended for him to proceed with day 15 of cycle #2 today as a scheduled. He has some dizzy spells recently and recommended for the patient to see his cardiologist for reevaluation. He would come back for follow-up visit in 2 weeks for reevaluation before starting cycle #3. The patient was advised to call immediately if he has any concerning symptoms in the interval.   Disclaimer: This note was dictated with voice recognition software. Similar sounding words can inadvertently be transcribed and may be missed upon review. Eilleen Kempf., MD 03/13/2015

## 2015-03-13 NOTE — Patient Instructions (Signed)
[facility]cer Center Discharge Instructions for Patients Receiving Chemotherapy  Today you received the following chemotherapy agents adriamycin, vinblastine, bleomycin, dacarbazine  To help prevent nausea and vomiting after your treatment, we encourage you to take your nausea medication   If you develop nausea and vomiting that is not controlled by your nausea medication, call the clinic.   BELOW ARE SYMPTOMS THAT SHOULD BE REPORTED IMMEDIATELY:  *FEVER GREATER THAN 100.5 F  *CHILLS WITH OR WITHOUT FEVER  NAUSEA AND VOMITING THAT IS NOT CONTROLLED WITH YOUR NAUSEA MEDICATION  *UNUSUAL SHORTNESS OF BREATH  *UNUSUAL BRUISING OR BLEEDING  TENDERNESS IN MOUTH AND THROAT WITH OR WITHOUT PRESENCE OF ULCERS  *URINARY PROBLEMS  *BOWEL PROBLEMS  UNUSUAL RASH Items with * indicate a potential emergency and should be followed up as soon as possible.  Feel free to call the clinic you have any questions or concerns. The clinic phone number is [phone].  Please show the CHEMO ALERT CARD at check-in to the Emergency Department and triage nurse.   

## 2015-03-19 ENCOUNTER — Other Ambulatory Visit: Payer: Self-pay | Admitting: *Deleted

## 2015-03-19 DIAGNOSIS — C8140 Lymphocyte-rich classical Hodgkin lymphoma, unspecified site: Secondary | ICD-10-CM

## 2015-03-20 ENCOUNTER — Other Ambulatory Visit: Payer: Medicare Other

## 2015-03-27 ENCOUNTER — Ambulatory Visit (HOSPITAL_BASED_OUTPATIENT_CLINIC_OR_DEPARTMENT_OTHER): Payer: Medicare Other

## 2015-03-27 ENCOUNTER — Encounter: Payer: Self-pay | Admitting: Internal Medicine

## 2015-03-27 ENCOUNTER — Ambulatory Visit (HOSPITAL_BASED_OUTPATIENT_CLINIC_OR_DEPARTMENT_OTHER): Payer: Medicare Other | Admitting: Internal Medicine

## 2015-03-27 ENCOUNTER — Other Ambulatory Visit (HOSPITAL_BASED_OUTPATIENT_CLINIC_OR_DEPARTMENT_OTHER): Payer: Medicare Other

## 2015-03-27 ENCOUNTER — Telehealth: Payer: Self-pay | Admitting: Internal Medicine

## 2015-03-27 VITALS — BP 91/39 | HR 92 | Temp 99.0°F | Resp 18 | Ht 71.0 in | Wt 176.7 lb

## 2015-03-27 DIAGNOSIS — C8104 Nodular lymphocyte predominant Hodgkin lymphoma, lymph nodes of axilla and upper limb: Secondary | ICD-10-CM | POA: Diagnosis not present

## 2015-03-27 DIAGNOSIS — Z5111 Encounter for antineoplastic chemotherapy: Secondary | ICD-10-CM | POA: Diagnosis not present

## 2015-03-27 DIAGNOSIS — C8140 Lymphocyte-rich classical Hodgkin lymphoma, unspecified site: Secondary | ICD-10-CM

## 2015-03-27 DIAGNOSIS — I959 Hypotension, unspecified: Secondary | ICD-10-CM | POA: Diagnosis not present

## 2015-03-27 DIAGNOSIS — R0600 Dyspnea, unspecified: Secondary | ICD-10-CM | POA: Diagnosis not present

## 2015-03-27 DIAGNOSIS — C8114 Nodular sclerosis classical Hodgkin lymphoma, lymph nodes of axilla and upper limb: Secondary | ICD-10-CM

## 2015-03-27 DIAGNOSIS — C8194 Hodgkin lymphoma, unspecified, lymph nodes of axilla and upper limb: Secondary | ICD-10-CM | POA: Insufficient documentation

## 2015-03-27 DIAGNOSIS — C8174 Other classical Hodgkin lymphoma, lymph nodes of axilla and upper limb: Secondary | ICD-10-CM

## 2015-03-27 LAB — COMPREHENSIVE METABOLIC PANEL (CC13)
ANION GAP: 9 meq/L (ref 3–11)
AST: 12 U/L (ref 5–34)
Albumin: 2.9 g/dL — ABNORMAL LOW (ref 3.5–5.0)
Alkaline Phosphatase: 49 U/L (ref 40–150)
BILIRUBIN TOTAL: 0.43 mg/dL (ref 0.20–1.20)
BUN: 17.7 mg/dL (ref 7.0–26.0)
CALCIUM: 9.3 mg/dL (ref 8.4–10.4)
CO2: 25 meq/L (ref 22–29)
CREATININE: 1.3 mg/dL (ref 0.7–1.3)
Chloride: 107 mEq/L (ref 98–109)
EGFR: 62 mL/min/{1.73_m2} — AB (ref >90)
Glucose: 114 mg/dl (ref 70–140)
Potassium: 4 mEq/L (ref 3.5–5.1)
Sodium: 141 mEq/L (ref 136–145)
TOTAL PROTEIN: 6.6 g/dL (ref 6.4–8.3)

## 2015-03-27 LAB — CBC WITH DIFFERENTIAL/PLATELET
BASO%: 0.5 % (ref 0.0–2.0)
Basophils Absolute: 0 10*3/uL (ref 0.0–0.1)
EOS ABS: 0 10*3/uL (ref 0.0–0.5)
EOS%: 0.3 % (ref 0.0–7.0)
HEMATOCRIT: 27.8 % — AB (ref 38.4–49.9)
HGB: 8.3 g/dL — ABNORMAL LOW (ref 13.0–17.1)
LYMPH#: 0.9 10*3/uL (ref 0.9–3.3)
LYMPH%: 23 % (ref 14.0–49.0)
MCH: 23.2 pg — ABNORMAL LOW (ref 27.2–33.4)
MCHC: 29.9 g/dL — AB (ref 32.0–36.0)
MCV: 77.9 fL — AB (ref 79.3–98.0)
MONO#: 0.9 10*3/uL (ref 0.1–0.9)
MONO%: 23.2 % — ABNORMAL HIGH (ref 0.0–14.0)
NEUT%: 53 % (ref 39.0–75.0)
NEUTROS ABS: 2 10*3/uL (ref 1.5–6.5)
PLATELETS: 358 10*3/uL (ref 140–400)
RBC: 3.57 10*6/uL — AB (ref 4.20–5.82)
RDW: 23.6 % — ABNORMAL HIGH (ref 11.0–14.6)
WBC: 3.8 10*3/uL — ABNORMAL LOW (ref 4.0–10.3)

## 2015-03-27 MED ORDER — SODIUM CHLORIDE 0.9 % IV SOLN
Freq: Once | INTRAVENOUS | Status: AC
  Administered 2015-03-27: 12:00:00 via INTRAVENOUS

## 2015-03-27 MED ORDER — SODIUM CHLORIDE 0.9 % IV SOLN
Freq: Once | INTRAVENOUS | Status: AC
  Administered 2015-03-27: 12:00:00 via INTRAVENOUS
  Filled 2015-03-27: qty 8

## 2015-03-27 MED ORDER — HEPARIN SOD (PORK) LOCK FLUSH 100 UNIT/ML IV SOLN
500.0000 [IU] | Freq: Once | INTRAVENOUS | Status: AC | PRN
  Administered 2015-03-27: 500 [IU]
  Filled 2015-03-27: qty 5

## 2015-03-27 MED ORDER — SODIUM CHLORIDE 0.9 % IV SOLN
375.0000 mg/m2 | Freq: Once | INTRAVENOUS | Status: AC
  Administered 2015-03-27: 760 mg via INTRAVENOUS
  Filled 2015-03-27: qty 38

## 2015-03-27 MED ORDER — BLEOMYCIN SULFATE CHEMO INJECTION 30 UNIT
10.0000 [IU]/m2 | Freq: Once | INTRAMUSCULAR | Status: AC
Start: 2015-03-27 — End: 2015-03-27
  Administered 2015-03-27: 20 [IU] via INTRAVENOUS
  Filled 2015-03-27: qty 6.67

## 2015-03-27 MED ORDER — SODIUM CHLORIDE 0.9 % IV SOLN
5.9000 mg/m2 | Freq: Once | INTRAVENOUS | Status: AC
  Administered 2015-03-27: 12 mg via INTRAVENOUS
  Filled 2015-03-27: qty 12

## 2015-03-27 MED ORDER — SODIUM CHLORIDE 0.9 % IJ SOLN
10.0000 mL | INTRAMUSCULAR | Status: DC | PRN
  Administered 2015-03-27: 10 mL
  Filled 2015-03-27: qty 10

## 2015-03-27 MED ORDER — DOXORUBICIN HCL CHEMO IV INJECTION 2 MG/ML
25.0000 mg/m2 | Freq: Once | INTRAVENOUS | Status: AC
  Administered 2015-03-27: 50 mg via INTRAVENOUS
  Filled 2015-03-27: qty 25

## 2015-03-27 NOTE — Patient Instructions (Signed)
[facility] Discharge Instructions for Patients Receiving Chemotherapy  Today you received the following chemotherapy agents: Adriamycin, Vinblastine, Bleomycin and Dacarbazine   To help prevent nausea and vomiting after your treatment, we encourage you to take your nausea medication as directed.    If you develop nausea and vomiting that is not controlled by your nausea medication, call the clinic.   BELOW ARE SYMPTOMS THAT SHOULD BE REPORTED IMMEDIATELY:  *FEVER GREATER THAN 100.5 F  *CHILLS WITH OR WITHOUT FEVER  NAUSEA AND VOMITING THAT IS NOT CONTROLLED WITH YOUR NAUSEA MEDICATION  *UNUSUAL SHORTNESS OF BREATH  *UNUSUAL BRUISING OR BLEEDING  TENDERNESS IN MOUTH AND THROAT WITH OR WITHOUT PRESENCE OF ULCERS  *URINARY PROBLEMS  *BOWEL PROBLEMS  UNUSUAL RASH Items with * indicate a potential emergency and should be followed up as soon as possible.  Feel free to call the clinic you have any questions or concerns. The clinic phone number is [phone].  Please show the CHEMO ALERT CARD at check-in to the Emergency Department and triage nurse.   

## 2015-03-27 NOTE — Telephone Encounter (Signed)
s.w. pt and advised cx appt and DEC appt...ok adn aware

## 2015-03-27 NOTE — Progress Notes (Signed)
[place] Telephone:(336) [phone]   Fax:(336) [phone]  OFFICE PROGRESS NOTE  Kandice Hams, MD [address] [place] [address]  Principle Diagnosis: Hodgkin's lymphoma, initially diagnosed in 1979,recurrence in April 2011  Prior Therapy: Status post radiation 42 CT Y without systemic chemotherapy  Current therapy: Systemic chemotherapy with ABVD on days 1 and 15 every 4 weeks. Status post 1 cycle and day 1 of cycle #3.  INTERVAL HISTORY: Jim Beck 81 y.o. male returns to the clinic today for follow-up visit. The patient is feeling fine today with no specific complaints except for shortness of breath. He also has some dizzy spells and recent fall secondary to hypotension. He tolerated the last cycle of his systemic chemotherapy fairly well with no significant adverse effects except for mild fatigue. He denied having any significant fever or chills. He has no nausea or vomiting. The patient denied having any significant chest pain, shortness of breath, cough or hemoptysis. No significant weight loss or night sweats. He was supposed to start day 15 of cycle #3.  MEDICAL HISTORY: Past Medical History  Diagnosis Date  . COPD (chronic obstructive pulmonary disease) ([place])   . Tobacco abuse   . Hypertension   . Hyperlipemia   . GERD (gastroesophageal reflux disease)     large hiatal hernia and stricture  . Hx of blood diseases     bullous blood disease  . PUD (peptic ulcer disease)   . BPH (benign prostatic hypertrophy)   . History of alcohol abuse   . Heart murmur   . Diverticulosis   . External hemorrhoids   . Arthritis   . Myocardial infarction ([place])   . Substance abuse   . Personal history of colonic polyps - adenomas 11/30/2013    12 mm and diminutive adenoma - no recall planned (age)  . Shortness of breath dyspnea     with exertion  . Depression   . Constipation   . History of hiatal hernia     per notes  . Hodgkin lymphoma  ([place]) 07/2009    head and neck and throat    ALLERGIES:  is allergic to aspirin.  MEDICATIONS:  Current Outpatient Prescriptions  Medication Sig Dispense Refill  . allopurinol (ZYLOPRIM) 100 MG tablet Take 1 tablet (100 mg total) by mouth 2 (two) times daily. 60 tablet 2  . amLODipine (NORVASC) 2.5 MG tablet Take 2.5 mg by mouth every morning.   0  . aspirin EC 81 MG tablet Take 81 mg by mouth every morning.     Marland Kitchen HYDROcodone-acetaminophen (NORCO) 5-325 MG per tablet Take 1-2 tablets by mouth every 4 (four) hours as needed. 30 tablet 0  . lidocaine-prilocaine (EMLA) cream Apply 1 application topically as needed. 30 g 1  . prochlorperazine (COMPAZINE) 10 MG tablet take 1 tablet by mouth every 6 hours if needed for nausea and vomiting 30 tablet 1  . rosuvastatin (CRESTOR) 10 MG tablet Take 10 mg by mouth every morning.      No current facility-administered medications for this visit.    SURGICAL HISTORY:  Past Surgical History  Procedure Laterality Date  . Inguinal hernia repair      bilateral  . Upper gastrointestinal endoscopy  03/08/2002    esophageal stricture, hiatal hernia  . Colonoscopy  03/08/2002    diverticulosis, external hemorrhoids  . Lung surgery  2011  . Esophageal dilation      x2  . Throat cancer    .  Lymph node biopsy Left 09/26/2014    Procedure: EXCISIONAL BIOPSY LEFT AXILLARY LYMPH NODE;  Surgeon: Coralie Keens, MD;  Location: [facility];  Service: General;  Laterality: Left;    REVIEW OF SYSTEMS:  Constitutional: positive for fatigue Respiratory: positive for dyspnea on exertion   PHYSICAL EXAMINATION: General appearance: alert, cooperative, fatigued and no distress Head: Normocephalic, without obvious abnormality, atraumatic Neck: no adenopathy, no JVD, supple, symmetrical, trachea midline and thyroid not enlarged, symmetric, no tenderness/mass/nodules Lymph nodes: Axillary adenopathy: 2-3 cm palpable left axillary lymphadenopathy Resp: clear to  auscultation bilaterally Back: symmetric, no curvature. ROM normal. No CVA tenderness. Cardio: regular rate and rhythm, S1, S2 normal, no murmur, click, rub or gallop GI: soft, non-tender; bowel sounds normal; no masses,  no organomegaly Extremities: extremities normal, atraumatic, no cyanosis or edema Neurologic: Alert and oriented X 3, normal strength and tone. Normal symmetric reflexes. Normal coordination and gait  ECOG PERFORMANCE STATUS: 1 - Symptomatic but completely ambulatory  Blood pressure 91/39, pulse 92, temperature 99 F (37.2 C), temperature source Oral, resp. rate 18, height 5\' 11"  (1.803 m), weight 176 lb 11.2 oz (80.151 kg), SpO2 95 %.  LABORATORY DATA: Lab Results  Component Value Date   WBC 3.8* 03/27/2015   HGB 8.3* 03/27/2015   HCT 27.8* 03/27/2015   MCV 77.9* 03/27/2015   PLT 358 03/27/2015      Chemistry      Component Value Date/Time   NA 141 03/27/2015 1002   NA 139 01/01/2015 1000   K 4.0 03/27/2015 1002   K 3.6 01/01/2015 1000   CL 107 01/01/2015 1000   CO2 25 03/27/2015 1002   CO2 28 01/01/2015 1000   BUN 17.7 03/27/2015 1002   BUN 13 01/01/2015 1000   CREATININE 1.3 03/27/2015 1002   CREATININE 0.91 01/01/2015 1000      Component Value Date/Time   CALCIUM 9.3 03/27/2015 1002   CALCIUM 9.0 01/01/2015 1000   ALKPHOS 49 03/27/2015 1002   ALKPHOS 50 12/19/2014 1303   AST 12 03/27/2015 1002   AST 20 12/19/2014 1303   ALT <9 03/27/2015 1002   ALT 14* 12/19/2014 1303   BILITOT 0.43 03/27/2015 1002   BILITOT 0.4 12/19/2014 1303       RADIOGRAPHIC STUDIES: No results found.  ASSESSMENT AND PLAN: This is a very pleasant 81 years old African-American male with recurrent nodular lymphocyte predominant Hodgkin lymphoma presenting with large left axillary lymphadenopathy in addition to prevascular lymphadenopathy seen on the previous scan. The patient is currently undergoing systemic chemotherapy with ABVD status post 2 cycle and day 1 of cycle  #3. The patient has been tolerating his treatment well except for the worsening dyspnea recently. I recommended for the patient to proceed with day 15 of cycle #3 today as a scheduled. I will see him back for follow-up visit in one month after repeating PET scan as well as blood work for restaging of his disease. For the hypertension. Recommended for the patient to contact his cardiologist for adjustment of his antihypertensive medication.  He was advised to call immediately if he has any concerning symptoms in the interval. The patient voices understanding of current disease status and treatment options and is in agreement with the current care plan.  All questions were answered. The patient knows to call the clinic with any problems, questions or concerns. We can certainly see the patient much sooner if necessary.  Disclaimer: This note was dictated with voice recognition software. Similar sounding words can inadvertently  be transcribed and may not be corrected upon review.

## 2015-04-03 ENCOUNTER — Other Ambulatory Visit: Payer: Medicare Other

## 2015-04-04 ENCOUNTER — Inpatient Hospital Stay (HOSPITAL_COMMUNITY)
Admission: EM | Admit: 2015-04-04 | Discharge: 2015-04-10 | DRG: 190 | Disposition: A | Discharge status: Skilled Nursing Facility | Payer: Medicare Other | Attending: Internal Medicine | Admitting: Internal Medicine

## 2015-04-04 ENCOUNTER — Emergency Department (HOSPITAL_COMMUNITY): Payer: Medicare Other

## 2015-04-04 ENCOUNTER — Encounter (HOSPITAL_COMMUNITY): Payer: Self-pay | Admitting: Emergency Medicine

## 2015-04-04 DIAGNOSIS — I509 Heart failure, unspecified: Secondary | ICD-10-CM | POA: Diagnosis present

## 2015-04-04 DIAGNOSIS — Y95 Nosocomial condition: Secondary | ICD-10-CM | POA: Diagnosis present

## 2015-04-04 DIAGNOSIS — T451X5A Adverse effect of antineoplastic and immunosuppressive drugs, initial encounter: Secondary | ICD-10-CM | POA: Diagnosis present

## 2015-04-04 DIAGNOSIS — I4891 Unspecified atrial fibrillation: Secondary | ICD-10-CM | POA: Diagnosis present

## 2015-04-04 DIAGNOSIS — I10 Essential (primary) hypertension: Secondary | ICD-10-CM | POA: Diagnosis present

## 2015-04-04 DIAGNOSIS — Z6825 Body mass index (BMI) 25.0-25.9, adult: Secondary | ICD-10-CM | POA: Diagnosis not present

## 2015-04-04 DIAGNOSIS — R531 Weakness: Secondary | ICD-10-CM | POA: Diagnosis present

## 2015-04-04 DIAGNOSIS — J9621 Acute and chronic respiratory failure with hypoxia: Secondary | ICD-10-CM | POA: Diagnosis present

## 2015-04-04 DIAGNOSIS — Z87891 Personal history of nicotine dependence: Secondary | ICD-10-CM | POA: Diagnosis not present

## 2015-04-04 DIAGNOSIS — Z8711 Personal history of peptic ulcer disease: Secondary | ICD-10-CM

## 2015-04-04 DIAGNOSIS — K449 Diaphragmatic hernia without obstruction or gangrene: Secondary | ICD-10-CM | POA: Diagnosis present

## 2015-04-04 DIAGNOSIS — N400 Enlarged prostate without lower urinary tract symptoms: Secondary | ICD-10-CM | POA: Diagnosis present

## 2015-04-04 DIAGNOSIS — Z886 Allergy status to analgesic agent status: Secondary | ICD-10-CM | POA: Diagnosis not present

## 2015-04-04 DIAGNOSIS — C8191 Hodgkin lymphoma, unspecified, lymph nodes of head, face, and neck: Secondary | ICD-10-CM | POA: Diagnosis present

## 2015-04-04 DIAGNOSIS — Z808 Family history of malignant neoplasm of other organs or systems: Secondary | ICD-10-CM

## 2015-04-04 DIAGNOSIS — M109 Gout, unspecified: Secondary | ICD-10-CM | POA: Diagnosis present

## 2015-04-04 DIAGNOSIS — I252 Old myocardial infarction: Secondary | ICD-10-CM

## 2015-04-04 DIAGNOSIS — Z801 Family history of malignant neoplasm of trachea, bronchus and lung: Secondary | ICD-10-CM | POA: Diagnosis not present

## 2015-04-04 DIAGNOSIS — Z9981 Dependence on supplemental oxygen: Secondary | ICD-10-CM

## 2015-04-04 DIAGNOSIS — J189 Pneumonia, unspecified organism: Secondary | ICD-10-CM | POA: Diagnosis present

## 2015-04-04 DIAGNOSIS — Z85819 Personal history of malignant neoplasm of unspecified site of lip, oral cavity, and pharynx: Secondary | ICD-10-CM

## 2015-04-04 DIAGNOSIS — E43 Unspecified severe protein-calorie malnutrition: Secondary | ICD-10-CM | POA: Diagnosis present

## 2015-04-04 DIAGNOSIS — C8174 Other classical Hodgkin lymphoma, lymph nodes of axilla and upper limb: Secondary | ICD-10-CM | POA: Diagnosis not present

## 2015-04-04 DIAGNOSIS — F329 Major depressive disorder, single episode, unspecified: Secondary | ICD-10-CM | POA: Diagnosis present

## 2015-04-04 DIAGNOSIS — I481 Persistent atrial fibrillation: Secondary | ICD-10-CM | POA: Diagnosis not present

## 2015-04-04 DIAGNOSIS — E785 Hyperlipidemia, unspecified: Secondary | ICD-10-CM | POA: Diagnosis not present

## 2015-04-04 DIAGNOSIS — K219 Gastro-esophageal reflux disease without esophagitis: Secondary | ICD-10-CM | POA: Diagnosis present

## 2015-04-04 DIAGNOSIS — N179 Acute kidney failure, unspecified: Secondary | ICD-10-CM | POA: Diagnosis present

## 2015-04-04 DIAGNOSIS — A419 Sepsis, unspecified organism: Secondary | ICD-10-CM | POA: Diagnosis present

## 2015-04-04 DIAGNOSIS — Z86010 Personal history of colonic polyps: Secondary | ICD-10-CM | POA: Diagnosis not present

## 2015-04-04 DIAGNOSIS — C8190 Hodgkin lymphoma, unspecified, unspecified site: Secondary | ICD-10-CM | POA: Diagnosis present

## 2015-04-04 DIAGNOSIS — M1990 Unspecified osteoarthritis, unspecified site: Secondary | ICD-10-CM | POA: Diagnosis present

## 2015-04-04 DIAGNOSIS — J449 Chronic obstructive pulmonary disease, unspecified: Secondary | ICD-10-CM | POA: Diagnosis present

## 2015-04-04 DIAGNOSIS — J440 Chronic obstructive pulmonary disease with acute lower respiratory infection: Secondary | ICD-10-CM | POA: Diagnosis present

## 2015-04-04 DIAGNOSIS — D649 Anemia, unspecified: Secondary | ICD-10-CM | POA: Diagnosis present

## 2015-04-04 DIAGNOSIS — K5900 Constipation, unspecified: Secondary | ICD-10-CM | POA: Diagnosis present

## 2015-04-04 DIAGNOSIS — D6481 Anemia due to antineoplastic chemotherapy: Secondary | ICD-10-CM | POA: Diagnosis present

## 2015-04-04 DIAGNOSIS — Z79899 Other long term (current) drug therapy: Secondary | ICD-10-CM

## 2015-04-04 DIAGNOSIS — Z7982 Long term (current) use of aspirin: Secondary | ICD-10-CM

## 2015-04-04 LAB — URINALYSIS, ROUTINE W REFLEX MICROSCOPIC
BILIRUBIN URINE: NEGATIVE
Glucose, UA: NEGATIVE mg/dL
HGB URINE DIPSTICK: NEGATIVE
Ketones, ur: NEGATIVE mg/dL
Leukocytes, UA: NEGATIVE
Nitrite: NEGATIVE
PROTEIN: 30 mg/dL — AB
Specific Gravity, Urine: 1.03 (ref 1.005–1.030)
pH: 5.5 (ref 5.0–8.0)

## 2015-04-04 LAB — I-STAT CHEM 8, ED
BUN: 28 mg/dL — AB (ref 6–20)
CREATININE: 1.2 mg/dL (ref 0.61–1.24)
Calcium, Ion: 1.13 mmol/L (ref 1.13–1.30)
Chloride: 106 mmol/L (ref 101–111)
Glucose, Bld: 127 mg/dL — ABNORMAL HIGH (ref 65–99)
HEMATOCRIT: 26 % — AB (ref 39.0–52.0)
Hemoglobin: 8.8 g/dL — ABNORMAL LOW (ref 13.0–17.0)
Potassium: 4.2 mmol/L (ref 3.5–5.1)
Sodium: 140 mmol/L (ref 135–145)
TCO2: 24 mmol/L (ref 0–100)

## 2015-04-04 LAB — COMPREHENSIVE METABOLIC PANEL
ALT: 15 U/L — AB (ref 17–63)
AST: 16 U/L (ref 15–41)
Albumin: 2.5 g/dL — ABNORMAL LOW (ref 3.5–5.0)
Alkaline Phosphatase: 48 U/L (ref 38–126)
Anion gap: 7 (ref 5–15)
BILIRUBIN TOTAL: 0.5 mg/dL (ref 0.3–1.2)
BUN: 27 mg/dL — AB (ref 6–20)
CO2: 25 mmol/L (ref 22–32)
CREATININE: 1.51 mg/dL — AB (ref 0.61–1.24)
Calcium: 8.6 mg/dL — ABNORMAL LOW (ref 8.9–10.3)
Chloride: 107 mmol/L (ref 101–111)
GFR, EST AFRICAN AMERICAN: 49 mL/min — AB (ref >60)
GFR, EST NON AFRICAN AMERICAN: 42 mL/min — AB (ref >60)
Glucose, Bld: 131 mg/dL — ABNORMAL HIGH (ref 65–99)
POTASSIUM: 4.3 mmol/L (ref 3.5–5.1)
Sodium: 139 mmol/L (ref 135–145)
TOTAL PROTEIN: 5.9 g/dL — AB (ref 6.5–8.1)

## 2015-04-04 LAB — CBC WITH DIFFERENTIAL/PLATELET
Basophils Absolute: 0 10*3/uL (ref 0.0–0.1)
Basophils Relative: 0 %
EOS ABS: 0 10*3/uL (ref 0.0–0.7)
Eosinophils Relative: 0 %
HCT: 24.4 % — ABNORMAL LOW (ref 39.0–52.0)
Hemoglobin: 7.4 g/dL — ABNORMAL LOW (ref 13.0–17.0)
LYMPHS ABS: 0.9 10*3/uL (ref 0.7–4.0)
Lymphocytes Relative: 20 %
MCH: 23.6 pg — AB (ref 26.0–34.0)
MCHC: 30.3 g/dL (ref 30.0–36.0)
MCV: 77.7 fL — AB (ref 78.0–100.0)
MONO ABS: 0.2 10*3/uL (ref 0.1–1.0)
Monocytes Relative: 4 %
NEUTROS ABS: 3.6 10*3/uL (ref 1.7–7.7)
NRBC: 9 /100{WBCs} — AB
Neutrophils Relative %: 76 %
PLATELETS: 263 10*3/uL (ref 150–400)
RBC: 3.14 MIL/uL — ABNORMAL LOW (ref 4.22–5.81)
RDW: 24.7 % — AB (ref 11.5–15.5)
WBC: 4.7 10*3/uL (ref 4.0–10.5)

## 2015-04-04 LAB — PROTIME-INR
INR: 1.26 (ref 0.00–1.49)
Prothrombin Time: 15.9 seconds — ABNORMAL HIGH (ref 11.6–15.2)

## 2015-04-04 LAB — APTT: aPTT: 30 seconds (ref 24–37)

## 2015-04-04 LAB — URINE MICROSCOPIC-ADD ON
BACTERIA UA: NONE SEEN
RBC / HPF: NONE SEEN RBC/hpf (ref 0–5)

## 2015-04-04 LAB — I-STAT CG4 LACTIC ACID, ED: LACTIC ACID, VENOUS: 1.54 mmol/L (ref 0.5–2.0)

## 2015-04-04 LAB — I-STAT TROPONIN, ED: Troponin i, poc: 0.02 ng/mL (ref 0.00–0.08)

## 2015-04-04 MED ORDER — ROSUVASTATIN CALCIUM 10 MG PO TABS
10.0000 mg | ORAL_TABLET | Freq: Every day | ORAL | Status: DC
  Administered 2015-04-05 – 2015-04-09 (×5): 10 mg via ORAL
  Filled 2015-04-04 (×6): qty 1

## 2015-04-04 MED ORDER — ACETAMINOPHEN 650 MG RE SUPP
650.0000 mg | Freq: Four times a day (QID) | RECTAL | Status: DC | PRN
Start: 2015-04-04 — End: 2015-04-10

## 2015-04-04 MED ORDER — ONDANSETRON HCL 4 MG/2ML IJ SOLN
4.0000 mg | Freq: Four times a day (QID) | INTRAMUSCULAR | Status: DC | PRN
  Administered 2015-04-05: 4 mg via INTRAVENOUS
  Filled 2015-04-04: qty 2

## 2015-04-04 MED ORDER — ADULT MULTIVITAMIN W/MINERALS CH
1.0000 | ORAL_TABLET | Freq: Every day | ORAL | Status: DC
  Administered 2015-04-05 – 2015-04-10 (×6): 1 via ORAL
  Filled 2015-04-04 (×6): qty 1

## 2015-04-04 MED ORDER — VANCOMYCIN HCL 10 G IV SOLR
1500.0000 mg | Freq: Once | INTRAVENOUS | Status: AC
  Administered 2015-04-04: 1500 mg via INTRAVENOUS
  Filled 2015-04-04: qty 1500

## 2015-04-04 MED ORDER — ALLOPURINOL 100 MG PO TABS
100.0000 mg | ORAL_TABLET | Freq: Two times a day (BID) | ORAL | Status: DC
  Administered 2015-04-05 – 2015-04-10 (×12): 100 mg via ORAL
  Filled 2015-04-04 (×15): qty 1

## 2015-04-04 MED ORDER — VITAMIN B-1 100 MG PO TABS
100.0000 mg | ORAL_TABLET | Freq: Every day | ORAL | Status: DC
  Administered 2015-04-05 – 2015-04-10 (×6): 100 mg via ORAL
  Filled 2015-04-04 (×6): qty 1

## 2015-04-04 MED ORDER — ALBUTEROL SULFATE (2.5 MG/3ML) 0.083% IN NEBU: 2.5000 mg | INHALATION_SOLUTION | Freq: Four times a day (QID) | RESPIRATORY_TRACT | Status: DC | PRN

## 2015-04-04 MED ORDER — IOHEXOL 350 MG/ML SOLN
100.0000 mL | Freq: Once | INTRAVENOUS | Status: AC | PRN
  Administered 2015-04-04: 100 mL via INTRAVENOUS

## 2015-04-04 MED ORDER — HEPARIN (PORCINE) IN NACL 100-0.45 UNIT/ML-% IJ SOLN
1100.0000 [IU]/h | INTRAMUSCULAR | Status: DC
  Administered 2015-04-04: 1100 [IU]/h via INTRAVENOUS
  Filled 2015-04-04: qty 250

## 2015-04-04 MED ORDER — SODIUM CHLORIDE 0.9 % IV BOLUS (SEPSIS)
1000.0000 mL | Freq: Once | INTRAVENOUS | Status: AC
  Administered 2015-04-04: 1000 mL via INTRAVENOUS

## 2015-04-04 MED ORDER — SODIUM CHLORIDE 0.9 % IV SOLN
INTRAVENOUS | Status: DC
  Administered 2015-04-05: via INTRAVENOUS

## 2015-04-04 MED ORDER — IPRATROPIUM BROMIDE 0.02 % IN SOLN: 0.5000 mg | Freq: Four times a day (QID) | RESPIRATORY_TRACT | Status: DC | PRN

## 2015-04-04 MED ORDER — HEPARIN BOLUS VIA INFUSION
4000.0000 [IU] | INTRAVENOUS | Status: AC
  Administered 2015-04-04: 4000 [IU] via INTRAVENOUS
  Filled 2015-04-04: qty 4000

## 2015-04-04 MED ORDER — FOLIC ACID 1 MG PO TABS
1.0000 mg | ORAL_TABLET | Freq: Every day | ORAL | Status: DC
  Administered 2015-04-05 – 2015-04-10 (×6): 1 mg via ORAL
  Filled 2015-04-04 (×6): qty 1

## 2015-04-04 MED ORDER — DILTIAZEM HCL 25 MG/5ML IV SOLN
15.0000 mg | Freq: Once | INTRAVENOUS | Status: AC
  Administered 2015-04-04: 15 mg via INTRAVENOUS
  Filled 2015-04-04: qty 5

## 2015-04-04 MED ORDER — PIPERACILLIN-TAZOBACTAM 3.375 G IVPB 30 MIN: 3.3750 g | Freq: Three times a day (TID) | INTRAVENOUS | Status: DC

## 2015-04-04 MED ORDER — HYDROCODONE-ACETAMINOPHEN 5-325 MG PO TABS
1.0000 | ORAL_TABLET | Freq: Four times a day (QID) | ORAL | Status: DC | PRN
Start: 2015-04-04 — End: 2015-04-10

## 2015-04-04 MED ORDER — VANCOMYCIN HCL IN DEXTROSE 1-5 GM/200ML-% IV SOLN: 1000.0000 mg | Freq: Once | INTRAVENOUS | Status: DC

## 2015-04-04 MED ORDER — ASPIRIN EC 81 MG PO TBEC
81.0000 mg | DELAYED_RELEASE_TABLET | Freq: Every morning | ORAL | Status: DC
  Administered 2015-04-05 – 2015-04-10 (×6): 81 mg via ORAL
  Filled 2015-04-04 (×6): qty 1

## 2015-04-04 MED ORDER — ACETAMINOPHEN 325 MG PO TABS
650.0000 mg | ORAL_TABLET | Freq: Four times a day (QID) | ORAL | Status: DC | PRN
  Administered 2015-04-08: 650 mg via ORAL
  Filled 2015-04-04: qty 2

## 2015-04-04 MED ORDER — PIPERACILLIN-TAZOBACTAM 3.375 G IVPB 30 MIN
3.3750 g | Freq: Once | INTRAVENOUS | Status: AC
  Administered 2015-04-04: 3.375 g via INTRAVENOUS
  Filled 2015-04-04: qty 50

## 2015-04-04 MED ORDER — ACETAMINOPHEN 325 MG PO TABS: 650.0000 mg | ORAL_TABLET | Freq: Four times a day (QID) | ORAL | Status: DC | PRN

## 2015-04-04 MED ORDER — ASPIRIN 81 MG PO CHEW
324.0000 mg | CHEWABLE_TABLET | Freq: Once | ORAL | Status: AC
  Administered 2015-04-04: 324 mg via ORAL
  Filled 2015-04-04: qty 4

## 2015-04-04 MED ORDER — ONDANSETRON HCL 4 MG PO TABS: 4.0000 mg | ORAL_TABLET | Freq: Four times a day (QID) | ORAL | Status: DC | PRN

## 2015-04-04 NOTE — ED Provider Notes (Signed)
CSN: [id number]     Arrival date & time 04/04/15  1711 History   First MD Initiated Contact with Patient 04/04/15 1734     Chief Complaint  Patient presents with  . Weakness     (Consider location/radiation/quality/duration/timing/severity/associated sxs/prior Treatment) Patient is a 81 y.o. male presenting with weakness. The history is provided by the patient and the spouse.  Weakness This is a new problem. The current episode started 2 days ago. The problem occurs constantly. The problem has not changed since onset.Pertinent negatives include no chest pain, no abdominal pain, no headaches and no shortness of breath. Nothing aggravates the symptoms. Nothing relieves the symptoms. He has tried nothing for the symptoms. The treatment provided no relief.    81 yo M with a chief complaint of weakness. Patient has Hodgkin's lymphoma is currently undergoing chemotherapy. Patient is unable to get up and move around at home because he passes out. Also having a cough is been continuing chronically. Patient is normally on 2 L oxygen at home but had to be increased here to four.   Past Medical History  Diagnosis Date  . COPD (chronic obstructive pulmonary disease) ([place])   . Tobacco abuse   . Hypertension   . Hyperlipemia   . GERD (gastroesophageal reflux disease)     large hiatal hernia and stricture  . Hx of blood diseases     bullous blood disease  . PUD (peptic ulcer disease)   . BPH (benign prostatic hypertrophy)   . History of alcohol abuse   . Heart murmur   . Diverticulosis   . External hemorrhoids   . Arthritis   . Myocardial infarction ([place])   . Substance abuse   . Personal history of colonic polyps - adenomas 11/30/2013    12 mm and diminutive adenoma - no recall planned (age)  . Shortness of breath dyspnea     with exertion  . Depression   . Constipation   . History of hiatal hernia     per notes  . Hodgkin lymphoma ([place]) 07/2009    head and neck and throat   Past  Surgical History  Procedure Laterality Date  . Inguinal hernia repair      bilateral  . Upper gastrointestinal endoscopy  03/08/2002    esophageal stricture, hiatal hernia  . Colonoscopy  03/08/2002    diverticulosis, external hemorrhoids  . Lung surgery  2011  . Esophageal dilation      x2  . Throat cancer    . Lymph node biopsy Left 09/26/2014    Procedure: EXCISIONAL BIOPSY LEFT AXILLARY LYMPH NODE;  Surgeon: Coralie Keens, MD;  Location: [facility];  Service: General;  Laterality: Left;   Family History  Problem Relation Age of Onset  . Cancer Brother     abdominal  . Lung cancer Brother   . Throat cancer Sister   . Colon cancer Neg Hx   . Esophageal cancer Neg Hx   . Rectal cancer Neg Hx   . Stomach cancer Neg Hx    Social History  Substance Use Topics  . Smoking status: Former Smoker    Quit date: 04/27/1977  . Smokeless tobacco: Never Used  . Alcohol Use: No    Review of Systems  Constitutional: Negative for fever and chills.  HENT: Negative for congestion and facial swelling.   Eyes: Negative for discharge and visual disturbance.  Respiratory: Negative for shortness of breath.   Cardiovascular: Negative for chest pain and palpitations.  Gastrointestinal: Positive  for nausea and diarrhea. Negative for vomiting and abdominal pain.  Musculoskeletal: Negative for myalgias and arthralgias.  Skin: Negative for color change and rash.  Neurological: Positive for weakness. Negative for tremors, syncope and headaches.  Psychiatric/Behavioral: Negative for confusion and dysphoric mood.      Allergies  Aspirin  Home Medications   Prior to Admission medications   Medication Sig Start Date End Date Taking? Authorizing Provider  allopurinol (ZYLOPRIM) 100 MG tablet Take 1 tablet (100 mg total) by mouth 2 (two) times daily. 11/27/14  Yes Curt Bears, MD  aspirin EC 81 MG tablet Take 81 mg by mouth every morning.    Yes Historical Provider, MD  lidocaine-prilocaine  (EMLA) cream Apply 1 application topically as needed. 01/02/15  Yes Adrena E Johnson, PA-C  OXYGEN Inhale 2 L into the lungs continuous.   Yes Historical Provider, MD  rosuvastatin (CRESTOR) 10 MG tablet Take 10 mg by mouth every morning.    Yes Historical Provider, MD  HYDROcodone-acetaminophen (NORCO) 5-325 MG per tablet Take 1-2 tablets by mouth every 4 (four) hours as needed. 09/26/14   Coralie Keens, MD  prochlorperazine (COMPAZINE) 10 MG tablet take 1 tablet by mouth every 6 hours if needed for nausea and vomiting 01/17/15   Curt Bears, MD   BP 129/71 mmHg  Pulse 105  Temp(Src) 98.4 F (36.9 C) (Oral)  Resp 20  SpO2 97% Physical Exam  Constitutional: He is oriented to person, place, and time. He appears well-developed and well-nourished.  HENT:  Head: Normocephalic and atraumatic.  Eyes: EOM are normal. Pupils are equal, round, and reactive to light.  Neck: Normal range of motion. Neck supple. No JVD present.  Cardiovascular: Normal rate and regular rhythm.  Exam reveals no gallop and no friction rub.   No murmur heard. Pulmonary/Chest: No respiratory distress. He has no wheezes.  Abdominal: He exhibits no distension. There is no tenderness. There is no rebound and no guarding.  Musculoskeletal: Normal range of motion.  Neurological: He is alert and oriented to person, place, and time.  Skin: No rash noted. No pallor.  Psychiatric: He has a normal mood and affect. His behavior is normal.  Nursing note and vitals reviewed.   ED Course  Procedures (including critical care time) Labs Review Labs Reviewed  COMPREHENSIVE METABOLIC PANEL - Abnormal; Notable for the following:    Glucose, Bld 131 (*)    BUN 27 (*)    Creatinine, Ser 1.51 (*)    Calcium 8.6 (*)    Total Protein 5.9 (*)    Albumin 2.5 (*)    ALT 15 (*)    GFR calc non Af Amer 42 (*)    GFR calc Af Amer 49 (*)    All other components within normal limits  URINALYSIS, ROUTINE W REFLEX MICROSCOPIC (NOT AT  [facility]) - Abnormal; Notable for the following:    Protein, ur 30 (*)    All other components within normal limits  CBC WITH DIFFERENTIAL/PLATELET - Abnormal; Notable for the following:    RBC 3.14 (*)    Hemoglobin 7.4 (*)    HCT 24.4 (*)    MCV 77.7 (*)    MCH 23.6 (*)    RDW 24.7 (*)    nRBC 9 (*)    All other components within normal limits  PROTIME-INR - Abnormal; Notable for the following:    Prothrombin Time 15.9 (*)    All other components within normal limits  URINE MICROSCOPIC-ADD ON - Abnormal; Notable for  the following:    Squamous Epithelial / LPF 0-5 (*)    Casts HYALINE CASTS (*)    All other components within normal limits  I-STAT CHEM 8, ED - Abnormal; Notable for the following:    BUN 28 (*)    Glucose, Bld 127 (*)    Hemoglobin 8.8 (*)    HCT 26.0 (*)    All other components within normal limits  URINE CULTURE  CULTURE, BLOOD (ROUTINE X 2)  CULTURE, BLOOD (ROUTINE X 2)  CULTURE, EXPECTORATED SPUTUM-ASSESSMENT  GRAM STAIN  APTT  HEPARIN LEVEL (UNFRACTIONATED)  CBC  CBC WITH DIFFERENTIAL/PLATELET  COMPREHENSIVE METABOLIC PANEL  LACTIC ACID, PLASMA  LACTIC ACID, PLASMA  PROCALCITONIN  PROTIME-INR  APTT  CBC  CREATININE, SERUM  TSH  HEMOGLOBIN A1C  OCCULT BLOOD X 1 CARD TO LAB, STOOL  LEGIONELLA PNEUMOPHILA SEROGP 1 UR AG  STREP PNEUMONIAE URINARY ANTIGEN  INFLUENZA PANEL BY PCR (TYPE A & B, H1N1)  I-STAT CG4 LACTIC ACID, ED  Randolm Idol, ED    Imaging Review Dg Chest 2 View  04/04/2015  CLINICAL DATA:  81 year old male with chest pain and cough for 8 months. Hodgkin's lymphoma. Hypertension. EXAM: CHEST  2 VIEW COMPARISON:  02/17/2012 chest x-ray.  11/20/2014 PET CT. FINDINGS: Right central line tip distal superior vena cava without pneumothorax. Large hiatal hernia. Mild cardiomegaly. Asymmetric airspace disease greater on the left has progressed since prior exam. Pulmonary vascular congestion may partially contribute to this finding however  patchy infiltrate or tumor within left lung may be present. IMPRESSION: Asymmetric airspace disease greater on the left has progressed since prior exam. Pulmonary vascular congestion may partially contribute to this finding however patchy infiltrate or tumor within left lung may be present. Large hiatal hernia. Electronically Signed   By: Genia Del M.D.   On: 04/04/2015 18:13   Ct Angio Chest Pe W/cm &/or Wo Cm  04/04/2015  CLINICAL DATA:  Weakness, chest pain and cough for 8 months. Lymphoma. EXAM: CT ANGIOGRAPHY CHEST WITH CONTRAST TECHNIQUE: Multidetector CT imaging of the chest was performed using the standard protocol during bolus administration of intravenous contrast. Multiplanar CT image reconstructions and MIPs were obtained to evaluate the vascular anatomy. CONTRAST:  145mL OMNIPAQUE IOHEXOL 350 MG/ML SOLN COMPARISON:  Radiographs 04/04/2015. CT 08/27/2014. PET-CT 11/20/2014. FINDINGS: Mediastinum: The pulmonary arteries are well opacified with contrast. There is no evidence of acute pulmonary embolism. There is central enlargement of the pulmonary arteries and right cardiac chamber dilatation consistent with pulmonary arterial hypertension. There is mild aortic atherosclerosis.Port-A-Cath tip in the right atrium. Previously demonstrated hypermetabolic left axillary adenopathy has improved with the largest remaining node measuring 8 mm short axis on image 25. There are scattered small mediastinal and hilar lymph nodes which are similar to prior CT, not significantly hypermetabolic on PET-CT. There is a moderate to large hiatal hernia. Lungs/Pleura: Small dependent right pleural effusion.Severe emphysema with interval superimposed patchy ground-glass opacities throughout both lungs as demonstrated on today's radiographs. There is relative sparing within the right lower lobe. Components in the left upper lobe are slightly more confluent, suspicious for pneumonia. No well-defined mass or suspicious  nodule. Upper abdomen: No significant findings visualized. There is reflux of contrast into the IVC and hepatic veins consistent with right heart failure. Musculoskeletal/Chest wall: No chest wall lesion or acute osseous findings. Review of the MIP images confirms the above findings. IMPRESSION: 1. No evidence of acute pulmonary embolism. 2. Right cardiac chamber enlargement and central enlargement of the pulmonary  arteries consistent with pulmonary arterial hypertension. 3. Severe emphysema with superimposed patchy ground-glass opacities in both lungs. Findings could be secondary to asymmetric edema, although there is a more confluent component in the left upper lobe suspicious for pneumonia. Small right pleural effusion. 4. Improvement in previously demonstrated hypermetabolic left axillary adenopathy consistent with treated lymphoma. 5. Moderate to large hiatal hernia. Electronically Signed   By: Richardean Sale M.D.   On: 04/04/2015 20:10   I have personally reviewed and evaluated these images and lab results as part of my medical decision-making.   EKG Interpretation   Date/Time:  Thursday April 04 2015 17:26:47 EST Ventricular Rate:  111 PR Interval:    QRS Duration: 121 QT Interval:  319 QTC Calculation: 433 R Axis:   25 Text Interpretation:  Atrial fibrillation Right bundle branch block Repol  abnrm suggests ischemia, lateral leads deep inverted t waves in V3, v4,  not seen prior Otherwise no significant change Confirmed by Devlyn Parish MD,  Quillian Quince [id number]) on 04/04/2015 8:50:02 PM      MDM   Final diagnoses:  HCAP (healthcare-associated pneumonia)  Atrial fibrillation, unspecified type [facility])    81 yo M with a chief complaint of weakness this been going on for the past month. Patient states that happened after his last chemotherapy session. He is being treated for Hodgkin's lymphoma. Family states for the last week is been unable to get up and out of bed and every time he tries he  passes out. Patient in atrial fibrillation on the monitor. Has no history of the same. Deep inverted T waves as well in the anterior leads. Patient having some mild chest pain and shortness of breath as well. Chest x-ray concerning for left-sided infiltrate versus mass will obtain a CT angiogram to rule out PE.  Hypoxic on home O2 into the 70's.   Patient was started on heparin for his new onset atrial fibrillation. Rate controlled with 15 of IV Cardizem. CT scan without PE but concern for possible left upper lobe infiltrate. With patient on chemotherapy will start vanc, zosyn.  Admit.   The patients results and plan were reviewed and discussed.   Any x-rays performed were independently reviewed by myself.   Differential diagnosis were considered with the presenting HPI.  Medications  allopurinol (ZYLOPRIM) tablet 100 mg (not administered)  aspirin EC tablet 81 mg (not administered)  rosuvastatin (CRESTOR) tablet 10 mg (not administered)  0.9 %  sodium chloride infusion (not administered)  acetaminophen (TYLENOL) tablet 650 mg (not administered)    Or  acetaminophen (TYLENOL) suppository 650 mg (not administered)  ondansetron (ZOFRAN) tablet 4 mg (not administered)    Or  ondansetron (ZOFRAN) injection 4 mg (not administered)  folic acid (FOLVITE) tablet 1 mg (not administered)  thiamine (VITAMIN B-1) tablet 100 mg (not administered)  multivitamin with minerals tablet 1 tablet (not administered)  vancomycin (VANCOCIN) IVPB 1000 mg/200 mL premix (not administered)  piperacillin-tazobactam (ZOSYN) IVPB 3.375 g (not administered)  albuterol (PROVENTIL) (2.5 MG/3ML) 0.083% nebulizer solution 2.5 mg (not administered)  ipratropium (ATROVENT) nebulizer solution 0.5 mg (not administered)  heparin ADULT infusion 100 units/mL (25000 units/250 mL) (1,100 Units/hr Intravenous New Bag/Given 04/04/15 2329)  acetaminophen (TYLENOL) tablet 650 mg (not administered)  HYDROcodone-acetaminophen  (NORCO/VICODIN) 5-325 MG per tablet 1-2 tablet (not administered)  aspirin chewable tablet 324 mg (324 mg Oral Given 04/04/15 1916)  sodium chloride 0.9 % bolus 1,000 mL (0 mLs Intravenous Stopped 04/04/15 2035)  diltiazem (CARDIZEM) injection 15 mg (15 mg  Intravenous Given 04/04/15 1848)  iohexol (OMNIPAQUE) 350 MG/ML injection 100 mL (100 mLs Intravenous Contrast Given 04/04/15 1938)  vancomycin (VANCOCIN) 1,500 mg in sodium chloride 0.9 % 500 mL IVPB (1,500 mg Intravenous Transfusing/Transfer 04/04/15 2158)  piperacillin-tazobactam (ZOSYN) IVPB 3.375 g (0 g Intravenous Stopped 04/04/15 2114)  heparin bolus via infusion 4,000 Units (4,000 Units Intravenous Given 04/04/15 2130)    Filed Vitals:   04/04/15 2000 04/04/15 2032 04/04/15 2136 04/04/15 2214  BP:   127/79 129/71  Pulse: 59 73 108 105  Temp:    98.4 F (36.9 C)  TempSrc:    Oral  Resp: 22 25 20 20   SpO2: 84% 100% 95% 97%    Final diagnoses:  HCAP (healthcare-associated pneumonia)  Atrial fibrillation, unspecified type [facility])    Admission/ observation were discussed with the admitting physician, patient and/or family and they are comfortable with the plan.    Deno Etienne, DO 04/04/15 2359

## 2015-04-04 NOTE — ED Notes (Signed)
Patient aware that a urine sample is needed, urinal is at bedside.

## 2015-04-04 NOTE — ED Notes (Signed)
Pt has a urinal and aware urine sample is needed

## 2015-04-04 NOTE — ED Notes (Addendum)
Patient c/o weakness, chest pain, and cough x 8 months, unsure of when it began. Pt states he was diagnosed with cancer recently but is unsure of what type, chart states hx of hodgkin's lymphoma. Pt states he has had chest pain x 8 years because his "lungs fell in and stuck to his chest." Pt normally wears 2 liters of O2 at home. Last chemo dose was last Wednesday. Pt also had lymph node removed from left arm. Patient's daughter states that patient has been week and unable to get out of bed for more than a week, and has been dizzy and had near-syncope when walking.

## 2015-04-04 NOTE — H&P (Signed)
[facility] History and Physical  Jim Beck [id number] DOB: [birth year] DOA: 04/04/2015  Referring physician: Deno Etienne, DO PCP: Kandice Hams, MD   Chief Complaint: weakness  HPI: Jim Beck is a 81 y.o. male with a history of Hodgkins Lymphoma HTN COPD HLD GERD presented to the hospital for weakness. Patient has a history of HL diagnosed in 1979 with a recurrence on 2011. Patient has been receiving chemotherapy and has been getting progressively weak. He was last seen on 11/30 in the cancer center and at that time he had been feeling fine. He received day 15 of cycle #3 and the plan was that he would come back in a month for a follow up PET scan. Patient was brought in today because of weakness. He is reported to not being able to get up and is not able to ambulate. His symptoms have been getting worse over the last week. Patient was also noted to have atrial fibrillation and has been started on heparin for this as this appears to be new onset. In addition the patient did have a CT scan which shows presence of LUL area infiltrates which could represent pneumonia vs atypical CHF. Since he has been on chemo and in and out of the hospital he will be covered for potential HCAP. He has been having cough and congestion. Also has felt tightness and pain in the lower central chest   Review of Systems:  Complete ROS performed and is unremarkable other than HPI.   Past Medical History  Diagnosis Date  . COPD (chronic obstructive pulmonary disease) ([place])   . Tobacco abuse   . Hypertension   . Hyperlipemia   . GERD (gastroesophageal reflux disease)     large hiatal hernia and stricture  . Hx of blood diseases     bullous blood disease  . PUD (peptic ulcer disease)   . BPH (benign prostatic hypertrophy)   . History of alcohol abuse   . Heart murmur   . Diverticulosis   . External hemorrhoids   . Arthritis   . Myocardial infarction ([place])   . Substance abuse   . Personal history  of colonic polyps - adenomas 11/30/2013    12 mm and diminutive adenoma - no recall planned (age)  . Shortness of breath dyspnea     with exertion  . Depression   . Constipation   . History of hiatal hernia     per notes  . Hodgkin lymphoma ([place]) 07/2009    head and neck and throat   Past Surgical History  Procedure Laterality Date  . Inguinal hernia repair      bilateral  . Upper gastrointestinal endoscopy  03/08/2002    esophageal stricture, hiatal hernia  . Colonoscopy  03/08/2002    diverticulosis, external hemorrhoids  . Lung surgery  2011  . Esophageal dilation      x2  . Throat cancer    . Lymph node biopsy Left 09/26/2014    Procedure: EXCISIONAL BIOPSY LEFT AXILLARY LYMPH NODE;  Surgeon: Coralie Keens, MD;  Location: [facility];  Service: General;  Laterality: Left;   Social History:  reports that he quit smoking about 37 years ago. He has never used smokeless tobacco. He reports that he does not drink alcohol or use illicit drugs.  Allergies  Allergen Reactions  . Aspirin     325mg  gives him shakes but low dose 81mg  is fine    Family History  Problem Relation Age of Onset  . Cancer  Brother     abdominal  . Lung cancer Brother   . Throat cancer Sister   . Colon cancer Neg Hx   . Esophageal cancer Neg Hx   . Rectal cancer Neg Hx   . Stomach cancer Neg Hx      Prior to Admission medications   Medication Sig Start Date End Date Taking? Authorizing Provider  allopurinol (ZYLOPRIM) 100 MG tablet Take 1 tablet (100 mg total) by mouth 2 (two) times daily. 11/27/14  Yes Curt Bears, MD  aspirin EC 81 MG tablet Take 81 mg by mouth every morning.    Yes Historical Provider, MD  lidocaine-prilocaine (EMLA) cream Apply 1 application topically as needed. 01/02/15  Yes Adrena E Johnson, PA-C  OXYGEN Inhale 2 L into the lungs continuous.   Yes Historical Provider, MD  rosuvastatin (CRESTOR) 10 MG tablet Take 10 mg by mouth every morning.    Yes Historical Provider, MD    HYDROcodone-acetaminophen (NORCO) 5-325 MG per tablet Take 1-2 tablets by mouth every 4 (four) hours as needed. 09/26/14   Coralie Keens, MD  prochlorperazine (COMPAZINE) 10 MG tablet take 1 tablet by mouth every 6 hours if needed for nausea and vomiting 01/17/15   Curt Bears, MD   Physical Exam: Filed Vitals:   04/04/15 1915 04/04/15 1930 04/04/15 2000 04/04/15 2032  BP: 113/74 118/90    Pulse:  74 59 73  Temp:      TempSrc:      Resp: 23 32 22 25  SpO2:  80% 84% 100%    Wt Readings from Last 3 Encounters:  03/27/15 80.151 kg (176 lb 11.2 oz)  03/13/15 83.598 kg (184 lb 4.8 oz)  02/26/15 83.371 kg (183 lb 12.8 oz)    General:  Appears calm and comfortable Eyes: PERRL, normal lids, irises & conjunctiva ENT: grossly normal hearing, lips & tongue Neck: no LAD, masses or thyromegaly Cardiovascular: IRR, no m/r/g. No LE edema Respiratory: few scattered Rhochi Normal respiratory effort. Abdomen: soft, ntnd Skin: no rash or induration seen on limited exam Musculoskeletal: grossly normal tone BUE/BLE Psychiatric: grossly normal mood and affect Neurologic: grossly non-focal.          Labs on Admission:  Basic Metabolic Panel:  Recent Labs Lab 04/04/15 1745 04/04/15 1754  NA 139 140  K 4.3 4.2  CL 107 106  CO2 25  --   GLUCOSE 131* 127*  BUN 27* 28*  CREATININE 1.51* 1.20  CALCIUM 8.6*  --    Liver Function Tests:  Recent Labs Lab 04/04/15 1745  AST 16  ALT 15*  ALKPHOS 48  BILITOT 0.5  PROT 5.9*  ALBUMIN 2.5*   No results for input(s): LIPASE, AMYLASE in the last 168 hours. No results for input(s): AMMONIA in the last 168 hours. CBC:  Recent Labs Lab 04/04/15 1745 04/04/15 1754  WBC 4.7  --   NEUTROABS 3.6  --   HGB 7.4* 8.8*  HCT 24.4* 26.0*  MCV 77.7*  --   PLT 263  --    Cardiac Enzymes: No results for input(s): CKTOTAL, CKMB, CKMBINDEX, TROPONINI in the last 168 hours.  BNP (last 3 results) No results for input(s): BNP in the last  8760 hours.  ProBNP (last 3 results) No results for input(s): PROBNP in the last 8760 hours.  CBG: No results for input(s): GLUCAP in the last 168 hours.  Radiological Exams on Admission: Dg Chest 2 View  04/04/2015  CLINICAL DATA:  80 year old male with chest pain  and cough for 8 months. Hodgkin's lymphoma. Hypertension. EXAM: CHEST  2 VIEW COMPARISON:  02/17/2012 chest x-ray.  11/20/2014 PET CT. FINDINGS: Right central line tip distal superior vena cava without pneumothorax. Large hiatal hernia. Mild cardiomegaly. Asymmetric airspace disease greater on the left has progressed since prior exam. Pulmonary vascular congestion may partially contribute to this finding however patchy infiltrate or tumor within left lung may be present. IMPRESSION: Asymmetric airspace disease greater on the left has progressed since prior exam. Pulmonary vascular congestion may partially contribute to this finding however patchy infiltrate or tumor within left lung may be present. Large hiatal hernia. Electronically Signed   By: Genia Del M.D.   On: 04/04/2015 18:13   Ct Angio Chest Pe W/cm &/or Wo Cm  04/04/2015  CLINICAL DATA:  Weakness, chest pain and cough for 8 months. Lymphoma. EXAM: CT ANGIOGRAPHY CHEST WITH CONTRAST TECHNIQUE: Multidetector CT imaging of the chest was performed using the standard protocol during bolus administration of intravenous contrast. Multiplanar CT image reconstructions and MIPs were obtained to evaluate the vascular anatomy. CONTRAST:  149mL OMNIPAQUE IOHEXOL 350 MG/ML SOLN COMPARISON:  Radiographs 04/04/2015. CT 08/27/2014. PET-CT 11/20/2014. FINDINGS: Mediastinum: The pulmonary arteries are well opacified with contrast. There is no evidence of acute pulmonary embolism. There is central enlargement of the pulmonary arteries and right cardiac chamber dilatation consistent with pulmonary arterial hypertension. There is mild aortic atherosclerosis.Port-A-Cath tip in the right atrium.  Previously demonstrated hypermetabolic left axillary adenopathy has improved with the largest remaining node measuring 8 mm short axis on image 25. There are scattered small mediastinal and hilar lymph nodes which are similar to prior CT, not significantly hypermetabolic on PET-CT. There is a moderate to large hiatal hernia. Lungs/Pleura: Small dependent right pleural effusion.Severe emphysema with interval superimposed patchy ground-glass opacities throughout both lungs as demonstrated on today's radiographs. There is relative sparing within the right lower lobe. Components in the left upper lobe are slightly more confluent, suspicious for pneumonia. No well-defined mass or suspicious nodule. Upper abdomen: No significant findings visualized. There is reflux of contrast into the IVC and hepatic veins consistent with right heart failure. Musculoskeletal/Chest wall: No chest wall lesion or acute osseous findings. Review of the MIP images confirms the above findings. IMPRESSION: 1. No evidence of acute pulmonary embolism. 2. Right cardiac chamber enlargement and central enlargement of the pulmonary arteries consistent with pulmonary arterial hypertension. 3. Severe emphysema with superimposed patchy ground-glass opacities in both lungs. Findings could be secondary to asymmetric edema, although there is a more confluent component in the left upper lobe suspicious for pneumonia. Small right pleural effusion. 4. Improvement in previously demonstrated hypermetabolic left axillary adenopathy consistent with treated lymphoma. 5. Moderate to large hiatal hernia. Electronically Signed   By: Richardean Sale M.D.   On: 04/04/2015 20:10         Assessment/Plan Principal Problem:   HCAP (healthcare-associated pneumonia) Active Problems:   Hodgkin lymphoma (HCC)   Weakness   Anemia   COPD (chronic obstructive pulmonary disease) (HCC)   HTN (hypertension)   HLD (hyperlipidemia)   1. HCAP -seen on the CT scan at  this time in the LUL area -will start on zosyn and Vancocin -will check sputum cultures blood cultures -will also check urine strep and legionella   2. Generalized weakness -likely due to combination of pneumonia and chemo -will start on therapy as noted above  3. Hodgkins Lymphoma -per oncology  4. COPD -duonebs as needed  5. HTN -will monitor pressures -currently  not on any medications  -reportedly on the chart he is to be on amlodipine  6. Gout -will continue with allopurinol  7. Atrial Fibrillation -started on heparin -rate controlled -will get echo   Code Status: full code DVT Prophylaxis:heparin Family Communication: none Disposition Plan:    [facility] A [facility] Pager [phone]

## 2015-04-04 NOTE — ED Notes (Signed)
Bed: [id number] Expected date:  Expected time:  Means of arrival:  Comments: Ems- cancer pt

## 2015-04-04 NOTE — Progress Notes (Signed)
ANTICOAGULATION CONSULT NOTE - Initial Consult  Pharmacy Consult for Heparin Indication: atrial fibrillation  Allergies  Allergen Reactions  . Aspirin     325mg  gives him shakes but low dose 81mg  is fine    Patient Measurements:    Weight 80.2 kg (03/27/15)  Vital Signs: Temp: 97.9 F (36.6 C) (12/08 1913) Temp Source: Oral (12/08 1720) BP: 118/90 mmHg (12/08 1930) Pulse Rate: 73 (12/08 2032)  Labs:  Recent Labs  04/04/15 1745 04/04/15 1754  HGB 7.4* 8.8*  HCT 24.4* 26.0*  PLT 263  --   CREATININE 1.51* 1.20    Estimated Creatinine Clearance: 52.3 mL/min (by C-G formula based on Cr of 1.2).   Medical History: Past Medical History  Diagnosis Date  . COPD (chronic obstructive pulmonary disease) ([place])   . Tobacco abuse   . Hypertension   . Hyperlipemia   . GERD (gastroesophageal reflux disease)     large hiatal hernia and stricture  . Hx of blood diseases     bullous blood disease  . PUD (peptic ulcer disease)   . BPH (benign prostatic hypertrophy)   . History of alcohol abuse   . Heart murmur   . Diverticulosis   . External hemorrhoids   . Arthritis   . Myocardial infarction ([place])   . Substance abuse   . Personal history of colonic polyps - adenomas 11/30/2013    12 mm and diminutive adenoma - no recall planned (age)  . Shortness of breath dyspnea     with exertion  . Depression   . Constipation   . History of hiatal hernia     per notes  . Hodgkin lymphoma ([place]) 07/2009    head and neck and throat    Medications:  Scheduled:   Infusions:  . piperacillin-tazobactam 3.375 g (04/04/15 2044)  . vancomycin      Assessment: 43 yo[facility] with weakness and chest pain.  PMH includes Hodgkin's lymphoma on chemotherapy.  He is noted to be in Afib.  No PTA anticoagulants noted.  CT angiogram negative for PE.  Pharmacy is consulted to dose Heparin IV for afib.    Today, 04/04/2015:  Baseline coags pending.  CBC: Hgb 8.8 and Plt  263  SCr 1.2, CrCl ~ 52 ml/min  Goal of Therapy:  Heparin level 0.3-0.7 units/ml Monitor platelets by anticoagulation protocol: Yes   Plan:   Baseline PTT, PT/INR  Give heparin 4000 units bolus IV x 1  Start heparin IV infusion at 1100 units/hr  Heparin level 8 hours after starting  Daily heparin level and CBC  Continue to monitor H&H and platelets  Gretta Arab PharmD, BCPS Pager [phone] 04/04/2015 9:09 PM

## 2015-04-04 NOTE — ED Notes (Signed)
Patient transported to CT 

## 2015-04-05 ENCOUNTER — Inpatient Hospital Stay (HOSPITAL_COMMUNITY): Payer: Medicare Other

## 2015-04-05 DIAGNOSIS — C8174 Other classical Hodgkin lymphoma, lymph nodes of axilla and upper limb: Secondary | ICD-10-CM

## 2015-04-05 DIAGNOSIS — I4891 Unspecified atrial fibrillation: Secondary | ICD-10-CM

## 2015-04-05 DIAGNOSIS — E785 Hyperlipidemia, unspecified: Secondary | ICD-10-CM

## 2015-04-05 DIAGNOSIS — D6481 Anemia due to antineoplastic chemotherapy: Secondary | ICD-10-CM

## 2015-04-05 DIAGNOSIS — J449 Chronic obstructive pulmonary disease, unspecified: Secondary | ICD-10-CM

## 2015-04-05 DIAGNOSIS — R531 Weakness: Secondary | ICD-10-CM

## 2015-04-05 DIAGNOSIS — T451X5A Adverse effect of antineoplastic and immunosuppressive drugs, initial encounter: Secondary | ICD-10-CM

## 2015-04-05 DIAGNOSIS — I10 Essential (primary) hypertension: Secondary | ICD-10-CM

## 2015-04-05 DIAGNOSIS — E43 Unspecified severe protein-calorie malnutrition: Secondary | ICD-10-CM

## 2015-04-05 DIAGNOSIS — J189 Pneumonia, unspecified organism: Secondary | ICD-10-CM

## 2015-04-05 LAB — COMPREHENSIVE METABOLIC PANEL
ALT: 14 U/L — ABNORMAL LOW (ref 17–63)
ANION GAP: 7 (ref 5–15)
AST: 15 U/L (ref 15–41)
Albumin: 2.7 g/dL — ABNORMAL LOW (ref 3.5–5.0)
Alkaline Phosphatase: 49 U/L (ref 38–126)
BILIRUBIN TOTAL: 0.4 mg/dL (ref 0.3–1.2)
BUN: 25 mg/dL — AB (ref 6–20)
CALCIUM: 8.4 mg/dL — AB (ref 8.9–10.3)
CO2: 24 mmol/L (ref 22–32)
Chloride: 107 mmol/L (ref 101–111)
Creatinine, Ser: 1.35 mg/dL — ABNORMAL HIGH (ref 0.61–1.24)
GFR calc Af Amer: 56 mL/min — ABNORMAL LOW (ref >60)
GFR, EST NON AFRICAN AMERICAN: 48 mL/min — AB (ref >60)
Glucose, Bld: 147 mg/dL — ABNORMAL HIGH (ref 65–99)
POTASSIUM: 4.3 mmol/L (ref 3.5–5.1)
Sodium: 138 mmol/L (ref 135–145)
TOTAL PROTEIN: 6 g/dL — AB (ref 6.5–8.1)

## 2015-04-05 LAB — EXPECTORATED SPUTUM ASSESSMENT W REFEX TO RESP CULTURE

## 2015-04-05 LAB — CBC
HCT: 23.4 % — ABNORMAL LOW (ref 39.0–52.0)
Hemoglobin: 7 g/dL — ABNORMAL LOW (ref 13.0–17.0)
MCH: 23.4 pg — AB (ref 26.0–34.0)
MCHC: 29.9 g/dL — AB (ref 30.0–36.0)
MCV: 78.3 fL (ref 78.0–100.0)
PLATELETS: 247 10*3/uL (ref 150–400)
RBC: 2.99 MIL/uL — ABNORMAL LOW (ref 4.22–5.81)
RDW: 24.7 % — AB (ref 11.5–15.5)
WBC: 4.2 10*3/uL (ref 4.0–10.5)

## 2015-04-05 LAB — CBC WITH DIFFERENTIAL/PLATELET
BASOS ABS: 0 10*3/uL (ref 0.0–0.1)
Basophils Relative: 0 %
EOS ABS: 0 10*3/uL (ref 0.0–0.7)
Eosinophils Relative: 0 %
HEMATOCRIT: 24.4 % — AB (ref 39.0–52.0)
Hemoglobin: 7.4 g/dL — ABNORMAL LOW (ref 13.0–17.0)
LYMPHS ABS: 1.2 10*3/uL (ref 0.7–4.0)
Lymphocytes Relative: 24 %
MCH: 23.6 pg — ABNORMAL LOW (ref 26.0–34.0)
MCHC: 30.3 g/dL (ref 30.0–36.0)
MCV: 78 fL (ref 78.0–100.0)
MONO ABS: 0.2 10*3/uL (ref 0.1–1.0)
MONOS PCT: 4 %
NEUTROS ABS: 3.7 10*3/uL (ref 1.7–7.7)
Neutrophils Relative %: 72 %
Platelets: 250 10*3/uL (ref 150–400)
RBC: 3.13 MIL/uL — AB (ref 4.22–5.81)
RDW: 24.8 % — AB (ref 11.5–15.5)
WBC: 5.1 10*3/uL (ref 4.0–10.5)

## 2015-04-05 LAB — APTT: aPTT: 87 seconds — ABNORMAL HIGH (ref 24–37)

## 2015-04-05 LAB — EXPECTORATED SPUTUM ASSESSMENT W GRAM STAIN, RFLX TO RESP C

## 2015-04-05 LAB — TSH: TSH: 1.461 u[IU]/mL (ref 0.350–4.500)

## 2015-04-05 LAB — PROCALCITONIN: Procalcitonin: 0.21 ng/mL

## 2015-04-05 LAB — ABO/RH: ABO/RH(D): O POS

## 2015-04-05 LAB — HEPARIN LEVEL (UNFRACTIONATED): HEPARIN UNFRACTIONATED: 0.26 [IU]/mL — AB (ref 0.30–0.70)

## 2015-04-05 LAB — GLUCOSE, CAPILLARY: Glucose-Capillary: 103 mg/dL — ABNORMAL HIGH (ref 65–99)

## 2015-04-05 LAB — PROTIME-INR
INR: 1.32 (ref 0.00–1.49)
PROTHROMBIN TIME: 16.6 s — AB (ref 11.6–15.2)

## 2015-04-05 LAB — INFLUENZA PANEL BY PCR (TYPE A & B)
H1N1FLUPCR: NOT DETECTED
Influenza A By PCR: NEGATIVE
Influenza B By PCR: NEGATIVE

## 2015-04-05 LAB — LACTIC ACID, PLASMA
LACTIC ACID, VENOUS: 1.3 mmol/L (ref 0.5–2.0)
Lactic Acid, Venous: 1.1 mmol/L (ref 0.5–2.0)

## 2015-04-05 LAB — PREPARE RBC (CROSSMATCH)

## 2015-04-05 LAB — STREP PNEUMONIAE URINARY ANTIGEN: STREP PNEUMO URINARY ANTIGEN: NEGATIVE

## 2015-04-05 MED ORDER — APIXABAN 5 MG PO TABS
5.0000 mg | ORAL_TABLET | Freq: Two times a day (BID) | ORAL | Status: DC
  Administered 2015-04-05 – 2015-04-10 (×10): 5 mg via ORAL
  Filled 2015-04-05 (×11): qty 1

## 2015-04-05 MED ORDER — HEPARIN (PORCINE) IN NACL 100-0.45 UNIT/ML-% IJ SOLN
1200.0000 [IU]/h | INTRAMUSCULAR | Status: DC
  Administered 2015-04-05: 1200 [IU]/h via INTRAVENOUS
  Filled 2015-04-05: qty 250

## 2015-04-05 MED ORDER — VANCOMYCIN HCL IN DEXTROSE 750-5 MG/150ML-% IV SOLN
750.0000 mg | Freq: Two times a day (BID) | INTRAVENOUS | Status: DC
  Administered 2015-04-05 – 2015-04-10 (×11): 750 mg via INTRAVENOUS
  Filled 2015-04-05 (×11): qty 150

## 2015-04-05 MED ORDER — METOPROLOL TARTRATE 1 MG/ML IV SOLN
10.0000 mg | INTRAVENOUS | Status: DC | PRN
  Administered 2015-04-05 – 2015-04-06 (×2): 10 mg via INTRAVENOUS
  Filled 2015-04-05 (×3): qty 10

## 2015-04-05 MED ORDER — ENSURE ENLIVE PO LIQD
237.0000 mL | Freq: Two times a day (BID) | ORAL | Status: DC
  Administered 2015-04-06 – 2015-04-08 (×5): 237 mL via ORAL

## 2015-04-05 MED ORDER — FUROSEMIDE 10 MG/ML IJ SOLN
40.0000 mg | Freq: Once | INTRAMUSCULAR | Status: AC
  Administered 2015-04-05: 40 mg via INTRAVENOUS
  Filled 2015-04-05: qty 4

## 2015-04-05 MED ORDER — PIPERACILLIN-TAZOBACTAM 3.375 G IVPB
3.3750 g | Freq: Three times a day (TID) | INTRAVENOUS | Status: DC
  Administered 2015-04-05 – 2015-04-10 (×17): 3.375 g via INTRAVENOUS
  Filled 2015-04-05 (×18): qty 50

## 2015-04-05 MED ORDER — SODIUM CHLORIDE 0.9 % IV SOLN
Freq: Once | INTRAVENOUS | Status: AC
  Administered 2015-04-05: 18:00:00 via INTRAVENOUS

## 2015-04-05 MED ORDER — SODIUM CHLORIDE 0.9 % IJ SOLN
10.0000 mL | INTRAMUSCULAR | Status: DC | PRN
  Administered 2015-04-07: 20 mL
  Administered 2015-04-10: 10 mL
  Filled 2015-04-05 (×2): qty 40

## 2015-04-05 NOTE — Progress Notes (Signed)
ANTIBIOTIC CONSULT NOTE - INITIAL  Pharmacy Consult for Vancomycin and Zosyn  Indication: HCAP/sepsis  Allergies  Allergen Reactions  . Aspirin     325mg  gives him shakes but low dose 81mg  is fine    Patient Measurements:   Adjusted Body Weight:   Vital Signs: Temp: 98.4 F (36.9 C) (12/08 2214) Temp Source: Oral (12/08 2214) BP: 129/71 mmHg (12/08 2214) Pulse Rate: 105 (12/08 2214) Intake/Output from previous day: 12/08 0701 - 12/09 0700 In: 1000 [I.V.:1000] Out: 100 [Urine:100] Intake/Output from this shift: Total I/O In: 1000 [I.V.:1000] Out: 100 [Urine:100]  Labs:  Recent Labs  04/04/15 1745 04/04/15 1754  WBC 4.7  --   HGB 7.4* 8.8*  PLT 263  --   CREATININE 1.51* 1.20   Estimated Creatinine Clearance: 52.3 mL/min (by C-G formula based on Cr of 1.2). No results for input(s): VANCOTROUGH, VANCOPEAK, VANCORANDOM, GENTTROUGH, GENTPEAK, GENTRANDOM, TOBRATROUGH, TOBRAPEAK, TOBRARND, AMIKACINPEAK, AMIKACINTROU, AMIKACIN in the last 72 hours.   Microbiology: Recent Results (from the past 720 hour(s))  TECHNOLOGIST REVIEW     Status: None   Collection Time: 03/06/15 11:23 AM  Result Value Ref Range Status   Technologist Review Few fragments and helmets  Final    Medical History: Past Medical History  Diagnosis Date  . COPD (chronic obstructive pulmonary disease) ([place])   . Tobacco abuse   . Hypertension   . Hyperlipemia   . GERD (gastroesophageal reflux disease)     large hiatal hernia and stricture  . Hx of blood diseases     bullous blood disease  . PUD (peptic ulcer disease)   . BPH (benign prostatic hypertrophy)   . History of alcohol abuse   . Heart murmur   . Diverticulosis   . External hemorrhoids   . Arthritis   . Myocardial infarction ([place])   . Substance abuse   . Personal history of colonic polyps - adenomas 11/30/2013    12 mm and diminutive adenoma - no recall planned (age)  . Shortness of breath dyspnea     with exertion  . Depression    . Constipation   . History of hiatal hernia     per notes  . Hodgkin lymphoma ([place]) 07/2009    head and neck and throat    Medications:  Anti-infectives    Start     Dose/Rate Route Frequency Ordered Stop   04/05/15 1200  vancomycin (VANCOCIN) IVPB 750 mg/150 ml premix     750 mg 150 mL/hr over 60 Minutes Intravenous Every 12 hours 04/05/15 0009     04/05/15 0600  piperacillin-tazobactam (ZOSYN) IVPB 3.375 g     3.375 g 12.5 mL/hr over 240 Minutes Intravenous 3 times per day 04/05/15 0009     04/04/15 2330  vancomycin (VANCOCIN) IVPB 1000 mg/200 mL premix  Status:  Discontinued     1,000 mg 200 mL/hr over 60 Minutes Intravenous  Once 04/04/15 2317 04/05/15 0002   04/04/15 2330  piperacillin-tazobactam (ZOSYN) IVPB 3.375 g  Status:  Discontinued     3.375 g 100 mL/hr over 30 Minutes Intravenous 3 times per day 04/04/15 2317 04/05/15 0002   04/04/15 2045  vancomycin (VANCOCIN) 1,500 mg in sodium chloride 0.9 % 500 mL IVPB     1,500 mg 250 mL/hr over 120 Minutes Intravenous  Once 04/04/15 2031 04/04/15 2326   04/04/15 2045  piperacillin-tazobactam (ZOSYN) IVPB 3.375 g     3.375 g 100 mL/hr over 30 Minutes Intravenous  Once 04/04/15 2031 04/04/15 2114  Assessment: Patient with HCAP and sepsis.  First dose of antibiotics already given.  Goal of Therapy:  Vancomycin trough level 15-20 mcg/ml  Zosyn based on renal function Appropriate antibiotic dosing for renal function; eradication of infection   Plan:  Measure antibiotic drug levels at steady state Follow up culture results Vancomycin 750mg  iv q12hr  Zosyn 3.375g IV Q8H infused over 4hrs.   Tyler Deis, Shea Stakes [place] 04/05/2015,12:12 AM

## 2015-04-05 NOTE — Progress Notes (Addendum)
ANTICOAGULATION CONSULT NOTE - Follow-up Consult  Pharmacy Consult for Heparin - transition  Indication: atrial fibrillation  Allergies  Allergen Reactions  . Aspirin     325mg  gives him shakes but low dose 81mg  is fine    Patient Measurements: Weight: 184 lb 8.4 oz (83.7 kg)  Weight 80.2 kg (03/27/15) Heparin dosing wt = 83.7kg  Vital Signs: Temp: 97.5 F (36.4 C) (12/09 1128) Temp Source: Oral (12/09 1128) BP: 96/51 mmHg (12/09 1128)  Labs:  Recent Labs  04/04/15 0200 04/04/15 1745 04/04/15 1754 04/05/15 0810 04/05/15 0818  HGB 7.4* 7.4* 8.8* 7.0*  --   HCT 24.4* 24.4* 26.0* 23.4*  --   PLT 250 263  --  247  --   APTT 87*  --  30  --   --   LABPROT 16.6*  --  15.9*  --   --   INR 1.32  --  1.26  --   --   HEPARINUNFRC  --   --   --   --  0.26*  CREATININE 1.35* 1.51* 1.20  --   --     Estimated Creatinine Clearance: 52.3 mL/min (by C-G formula based on Cr of 1.2).   Medical History: Past Medical History  Diagnosis Date  . COPD (chronic obstructive pulmonary disease) ([place])   . Tobacco abuse   . Hypertension   . Hyperlipemia   . GERD (gastroesophageal reflux disease)     large hiatal hernia and stricture  . Hx of blood diseases     bullous blood disease  . PUD (peptic ulcer disease)   . BPH (benign prostatic hypertrophy)   . History of alcohol abuse   . Heart murmur   . Diverticulosis   . External hemorrhoids   . Arthritis   . Myocardial infarction ([place])   . Substance abuse   . Personal history of colonic polyps - adenomas 11/30/2013    12 mm and diminutive adenoma - no recall planned (age)  . Shortness of breath dyspnea     with exertion  . Depression   . Constipation   . History of hiatal hernia     per notes  . Hodgkin lymphoma ([place]) 07/2009    head and neck and throat    Medications:  Scheduled:  . sodium chloride   Intravenous Once  . allopurinol  100 mg Oral BID  . apixaban  5 mg Oral BID  . aspirin EC  81 mg Oral q morning - 10a   . feeding supplement (ENSURE ENLIVE)  237 mL Oral BID BM  . folic acid  1 mg Oral Daily  . furosemide  40 mg Intravenous Once  . multivitamin with minerals  1 tablet Oral Daily  . piperacillin-tazobactam (ZOSYN)  IV  3.375 g Intravenous 3 times per day  . rosuvastatin  10 mg Oral q1800  . thiamine  100 mg Oral Daily  . vancomycin  750 mg Intravenous Q12H   Infusions:     Assessment: 65 yoM [facility] with weakness and chest pain.  PMH includes Hodgkin's lymphoma on chemotherapy.  He is noted to be in Afib.  No PTA anticoagulants noted.  CT angiogram negative for PE.  Pharmacy is consulted to dose Heparin IV for afib.    Today PM, 04/05/2015:  Baseline PT slightly elevated, INR WNL   CBC: Hgb dropped to 7.0, plts remain WNL, no bleeding per RN and MD.  Confirmed with SCr 1.2, CrCl ~ 52 ml/min  Changing IV heparin to apixaban for further treatment of Afib.   Goal of Therapy:  Monitor platelets by anticoagulation protocol: Yes   Plan:  D/C IV heparin 12/9 PM and start apixaban - age 87, SCr 1.2, Wt 84 kg so will start 5mg  BID Continue to monitor Scr, H&H and platelets   Adrian Saran, PharmD, BCPS Pager [phone] 04/05/2015 5:16 PM

## 2015-04-05 NOTE — Progress Notes (Signed)
[facility] PROGRESS NOTE   Jim Beck [id number] DOB: [birth year] DOA: 04/04/2015 PCP: Kandice Hams, MD  HPI/Subjective: Denies any complaints, some shortness of breath.  Developed rapid ventricular response with his atrial fibrillation, Controlled rapidly with one dose of IV metoprolol.  Assessment/Plan: Principal Problem:   HCAP (healthcare-associated pneumonia) Active Problems:   Hodgkin lymphoma ([place])   Weakness   Anemia   COPD (chronic obstructive pulmonary disease) (HCC)   HTN (hypertension)   HLD (hyperlipidemia)   Protein-calorie malnutrition, severe   HCAP Presented with shortness of breath, hypoxia, CT scan showed infiltration in the LUL. Started on Zosyn and vancomycin, blood and sputum cultures sent. Urine Legionella and Streptococcus antigen pending. Continue supportive management with bronchodilators, mucolytics, antitussives and oxygen as needed.  Acute on chronic respiratory failure with hypoxia Patient is on 2 L of oxygen at home continuously (reported he was placed recently on that). Currently needs 4 L of oxygen to keep his oxygen saturation in the mid 90s. Hypoxemia is likely secondary to the healthcare associated pneumonia.  Atrial fibrillation With rapid ventricular response, heart rate is in the 130s. Responded to boluses of IV metoprolol, started on IV heparin. Discontinued IV heparin drip and I'll start on Eliquis twice a day. 2-D echo pending 2-D echo showed ejection fraction of 55-60%  Anemia Secondary to recent chemotherapy, will transfuse 2 units of packed RBCs. I will give 1 dose of Lasix with the transfusion  Generalized weakness -likely due to combination of pneumonia and chemo -will start on therapy as noted above  Hodgkins Lymphoma -per oncology  COPD -duonebs as needed  HTN -will monitor pressures -currently not on any medications  -reportedly on the chart he is to be on amlodipine  Gout -will continue with  allopurinol    Code Status: Full Code Family Communication: Plan discussed with the patient. Disposition Plan: Remains inpatient Diet: Diet heart healthy/carb modified Room service appropriate?: Yes; Fluid consistency:: Thin  Consultants:  None  Procedures:  None  Antibiotics:  None   Objective: Filed Vitals:   04/05/15 0432 04/05/15 1128  BP: 107/69 96/51  Pulse: 98   Temp: 98.6 F (37 C) 97.5 F (36.4 C)  Resp: 22     Intake/Output Summary (Last 24 hours) at 04/05/15 1645 Last data filed at 04/05/15 0515  Gross per 24 hour  Intake 1739.17 ml  Output    100 ml  Net 1639.17 ml   Filed Weights   04/05/15 0432  Weight: 83.7 kg (184 lb 8.4 oz)    Exam: General: Alert and awake, oriented x3, not in any acute distress. HEENT: anicteric sclera, pupils reactive to light and accommodation, EOMI CVS: S1-S2 clear, no murmur rubs or gallops Chest: clear to auscultation bilaterally, no wheezing, rales or rhonchi Abdomen: soft nontender, nondistended, normal bowel sounds, no organomegaly Extremities: no cyanosis, clubbing or edema noted bilaterally Neuro: Cranial nerves II-XII intact, no focal neurological deficits  Data Reviewed: Basic Metabolic Panel:  Recent Labs Lab 04/04/15 0200 04/04/15 1745 04/04/15 1754  NA 138 139 140  K 4.3 4.3 4.2  CL 107 107 106  CO2 24 25  --   GLUCOSE 147* 131* 127*  BUN 25* 27* 28*  CREATININE 1.35* 1.51* 1.20  CALCIUM 8.4* 8.6*  --    Liver Function Tests:  Recent Labs Lab 04/04/15 0200 04/04/15 1745  AST 15 16  ALT 14* 15*  ALKPHOS 49 48  BILITOT 0.4 0.5  PROT 6.0* 5.9*  ALBUMIN 2.7* 2.5*   No results  for input(s): LIPASE, AMYLASE in the last 168 hours. No results for input(s): AMMONIA in the last 168 hours. CBC:  Recent Labs Lab 04/04/15 0200 04/04/15 1745 04/04/15 1754 04/05/15 0810  WBC 5.1 4.7  --  4.2  NEUTROABS 3.7 3.6  --   --   HGB 7.4* 7.4* 8.8* 7.0*  HCT 24.4* 24.4* 26.0* 23.4*  MCV 78.0  77.7*  --  78.3  PLT 250 263  --  247   Cardiac Enzymes: No results for input(s): CKTOTAL, CKMB, CKMBINDEX, TROPONINI in the last 168 hours. BNP (last 3 results) No results for input(s): BNP in the last 8760 hours.  ProBNP (last 3 results) No results for input(s): PROBNP in the last 8760 hours.  CBG:  Recent Labs Lab 04/05/15 0734  GLUCAP 103*    Micro Recent Results (from the past 240 hour(s))  Culture, sputum-assessment     Status: None   Collection Time: 04/05/15  6:31 AM  Result Value Ref Range Status   Specimen Description SPUTUM  Final   Special Requests NONE  Final   Sputum evaluation   Final    MICROSCOPIC FINDINGS SUGGEST THAT THIS SPECIMEN IS NOT REPRESENTATIVE OF LOWER RESPIRATORY SECRETIONS. PLEASE RECOLLECT. NOTIFIED [name] AT 0725 ON 12.9.16 BY SHUEA    Report Status 04/05/2015 FINAL  Final     Studies: Dg Chest 2 View  04/04/2015  CLINICAL DATA:  81 year old male with chest pain and cough for 8 months. Hodgkin's lymphoma. Hypertension. EXAM: CHEST  2 VIEW COMPARISON:  02/17/2012 chest x-ray.  11/20/2014 PET CT. FINDINGS: Right central line tip distal superior vena cava without pneumothorax. Large hiatal hernia. Mild cardiomegaly. Asymmetric airspace disease greater on the left has progressed since prior exam. Pulmonary vascular congestion may partially contribute to this finding however patchy infiltrate or tumor within left lung may be present. IMPRESSION: Asymmetric airspace disease greater on the left has progressed since prior exam. Pulmonary vascular congestion may partially contribute to this finding however patchy infiltrate or tumor within left lung may be present. Large hiatal hernia. Electronically Signed   By: Genia Del M.D.   On: 04/04/2015 18:13   Ct Angio Chest Pe W/cm &/or Wo Cm  04/04/2015  CLINICAL DATA:  Weakness, chest pain and cough for 8 months. Lymphoma. EXAM: CT ANGIOGRAPHY CHEST WITH CONTRAST TECHNIQUE: Multidetector CT imaging of the  chest was performed using the standard protocol during bolus administration of intravenous contrast. Multiplanar CT image reconstructions and MIPs were obtained to evaluate the vascular anatomy. CONTRAST:  155mL OMNIPAQUE IOHEXOL 350 MG/ML SOLN COMPARISON:  Radiographs 04/04/2015. CT 08/27/2014. PET-CT 11/20/2014. FINDINGS: Mediastinum: The pulmonary arteries are well opacified with contrast. There is no evidence of acute pulmonary embolism. There is central enlargement of the pulmonary arteries and right cardiac chamber dilatation consistent with pulmonary arterial hypertension. There is mild aortic atherosclerosis.Port-A-Cath tip in the right atrium. Previously demonstrated hypermetabolic left axillary adenopathy has improved with the largest remaining node measuring 8 mm short axis on image 25. There are scattered small mediastinal and hilar lymph nodes which are similar to prior CT, not significantly hypermetabolic on PET-CT. There is a moderate to large hiatal hernia. Lungs/Pleura: Small dependent right pleural effusion.Severe emphysema with interval superimposed patchy ground-glass opacities throughout both lungs as demonstrated on today's radiographs. There is relative sparing within the right lower lobe. Components in the left upper lobe are slightly more confluent, suspicious for pneumonia. No well-defined mass or suspicious nodule. Upper abdomen: No significant findings visualized. There is reflux  of contrast into the IVC and hepatic veins consistent with right heart failure. Musculoskeletal/Chest wall: No chest wall lesion or acute osseous findings. Review of the MIP images confirms the above findings. IMPRESSION: 1. No evidence of acute pulmonary embolism. 2. Right cardiac chamber enlargement and central enlargement of the pulmonary arteries consistent with pulmonary arterial hypertension. 3. Severe emphysema with superimposed patchy ground-glass opacities in both lungs. Findings could be secondary to  asymmetric edema, although there is a more confluent component in the left upper lobe suspicious for pneumonia. Small right pleural effusion. 4. Improvement in previously demonstrated hypermetabolic left axillary adenopathy consistent with treated lymphoma. 5. Moderate to large hiatal hernia. Electronically Signed   By: Richardean Sale M.D.   On: 04/04/2015 20:10    Scheduled Meds: . sodium chloride   Intravenous Once  . allopurinol  100 mg Oral BID  . aspirin EC  81 mg Oral q morning - 10a  . feeding supplement (ENSURE ENLIVE)  237 mL Oral BID BM  . folic acid  1 mg Oral Daily  . multivitamin with minerals  1 tablet Oral Daily  . piperacillin-tazobactam (ZOSYN)  IV  3.375 g Intravenous 3 times per day  . rosuvastatin  10 mg Oral q1800  . thiamine  100 mg Oral Daily  . vancomycin  750 mg Intravenous Q12H   Continuous Infusions: . sodium chloride 50 mL/hr at 04/05/15 0028  . heparin 1,200 Units/hr (04/05/15 1503)       Time spent: 35 minutes    [facility]  [facility] Pager [phone] If 7PM-7AM, please contact night-coverage at [email or web address], password [facility] 04/05/2015, 4:45 PM  LOS: 1 day

## 2015-04-05 NOTE — Progress Notes (Signed)
Initial Nutrition Assessment  DOCUMENTATION CODES:   Severe malnutrition in context of acute illness/injury  INTERVENTION:  - Will order Ensure Enlive BID, each supplement provides 350 kcal and 20 grams of protein - Encourage PO intakes of meals and supplements - RD will continue to monitor for needs  NUTRITION DIAGNOSIS:   Increased nutrient needs related to catabolic illness, cancer and cancer related treatments as evidenced by estimated needs.  GOAL:   Patient will meet greater than or equal to 90% of their needs  MONITOR:   PO intake, Supplement acceptance, Weight trends, Labs, Skin, I & O's  REASON FOR ASSESSMENT:   Malnutrition Screening Tool  ASSESSMENT:   81 y.o. male with a history of Hodgkins Lymphoma HTN COPD HLD GERD presented to the hospital for weakness. Patient has a history of HL diagnosed in 1979 with a recurrence on 2011. Patient has been receiving chemotherapy and has been getting progressively weak. He was last seen on 11/30 in the cancer center and at that time he had been feeling fine. He received day 15 of cycle #3 and the plan was that he would come back in a month for a follow up PET scan. Patient was brought in today because of weakness. He is reported to not being able to get up and is not able to ambulate. His symptoms have been getting worse over the last week. Patient was also noted to have atrial fibrillation and has been started on heparin for this as this appears to be new onset. In addition the patient did have a CT scan which shows presence of LUL area infiltrates which could represent pneumonia vs atypical CHF. Since he has been on chemo and in and out of the hospital he will be covered for potential HCAP. He has been having cough and congestion. Also has felt tightness and pain in the lower central chest  Pt seen for MST. BMI indicates overweight status. No intakes documented since admission. Pt states he had a few bites of fruit and drank a few  sips of juice for lunch today. He states he felt nauseated, abdominal cramping, and indigestion following minimal intake. Pt reports that these symptoms have been ongoing x4-5 years and occur with nearly all PO intakes. He has upper dentures in place and denies chewing difficulties. He states that he has been having swallowing difficulty for several years and needs to take extra time to swallow as he feels items are getting stuck in his throat.   Last chemo: 11/30. Pt states taste alteration in the form of metallic, dirt taste to foods while undergoing chemo. He has recently began drinking nutrition supplements such as Ensure and Boost and is interested in receiving them during hospitalization.  Pt reports UBW of 180 lbs and states that his weight has been fluctuating recently; confirmed by chart review. He lost 8 lbs (4% body weight) in 14 days in November; significant for time frame. Chart shows that since 11/30 pt has regained this weight. Will continue to monitor weight trends. Moderate and severe muscle wasting and moderate and severe fat wasting noted.   Pt points to a spot on his L elbow and states that this spot has been bothering him x1 year. Spot is dry, dark in nature and pt reports it sometimes has a burning sensation. Encouraged him to talk with MD about this.   Pt not meeting needs. Medications reviewed. Labs reviewed; BUN elevated, Ca: 8.6 mg/dL, GFR: 49.   Diet Order:  Diet heart healthy/carb  modified Room service appropriate?: Yes; Fluid consistency:: Thin  Skin:  Reviewed, no issues  Last BM:  PTA  Height:   Ht Readings from Last 1 Encounters:  03/27/15 5\' 11"  (1.803 m)    Weight:   Wt Readings from Last 1 Encounters:  04/05/15 184 lb 8.4 oz (83.7 kg)    Ideal Body Weight:  78.18 kg (kg)  BMI:  Body mass index is 25.75 kg/(m^2).  Estimated Nutritional Needs:   Kcal:  2350-2500  Protein:  85-100 grams  Fluid:  2-2.2 L/day  EDUCATION NEEDS:   No education  needs identified at this time     Jarome Matin, RD, LDN Inpatient Clinical Dietitian Pager # [phone] After hours/weekend pager # [phone]

## 2015-04-05 NOTE — Progress Notes (Signed)
Echocardiogram 2D Echocardiogram has been performed.  Tresa Res 04/05/2015, 1:45 PM

## 2015-04-05 NOTE — Progress Notes (Signed)
ANTICOAGULATION CONSULT NOTE - Follow-up Consult  Pharmacy Consult for Heparin Indication: atrial fibrillation  Allergies  Allergen Reactions  . Aspirin     325mg  gives him shakes but low dose 81mg  is fine    Patient Measurements: Weight: 184 lb 8.4 oz (83.7 kg)  Weight 80.2 kg (03/27/15) Heparin dosing wt = 83.7kg  Vital Signs: Temp: 98.6 F (37 C) (12/09 0432) Temp Source: Oral (12/09 0432) BP: 107/69 mmHg (12/09 0432) Pulse Rate: 98 (12/09 0432)  Labs:  Recent Labs  04/04/15 0200 04/04/15 1745 04/04/15 1754 04/05/15 0810 04/05/15 0818  HGB 7.4* 7.4* 8.8* 7.0*  --   HCT 24.4* 24.4* 26.0* 23.4*  --   PLT 250 263  --  247  --   APTT 87*  --  30  --   --   LABPROT 16.6*  --  15.9*  --   --   INR 1.32  --  1.26  --   --   HEPARINUNFRC  --   --   --   --  0.26*  CREATININE 1.35* 1.51* 1.20  --   --     Estimated Creatinine Clearance: 52.3 mL/min (by C-G formula based on Cr of 1.2).   Medical History: Past Medical History  Diagnosis Date  . COPD (chronic obstructive pulmonary disease) ([place])   . Tobacco abuse   . Hypertension   . Hyperlipemia   . GERD (gastroesophageal reflux disease)     large hiatal hernia and stricture  . Hx of blood diseases     bullous blood disease  . PUD (peptic ulcer disease)   . BPH (benign prostatic hypertrophy)   . History of alcohol abuse   . Heart murmur   . Diverticulosis   . External hemorrhoids   . Arthritis   . Myocardial infarction ([place])   . Substance abuse   . Personal history of colonic polyps - adenomas 11/30/2013    12 mm and diminutive adenoma - no recall planned (age)  . Shortness of breath dyspnea     with exertion  . Depression   . Constipation   . History of hiatal hernia     per notes  . Hodgkin lymphoma ([place]) 07/2009    head and neck and throat    Medications:  Scheduled:  . allopurinol  100 mg Oral BID  . aspirin EC  81 mg Oral q morning - [id number]  . folic acid  1 mg Oral Daily  . multivitamin with  minerals  1 tablet Oral Daily  . piperacillin-tazobactam (ZOSYN)  IV  3.375 g Intravenous 3 times per day  . rosuvastatin  10 mg Oral q1800  . thiamine  100 mg Oral Daily  . vancomycin  750 mg Intravenous Q12H   Infusions:  . sodium chloride 50 mL/hr at 04/05/15 0028  . heparin 1,100 Units/hr (04/04/15 2329)    Assessment: 38 yoM presented to [facility] on 12/8 with weakness and chest pain.  PMH includes Hodgkin's lymphoma on chemotherapy.  He is noted to be in Afib.  No PTA anticoagulants noted.  CT angiogram negative for PE.  Pharmacy is consulted to dose Heparin IV for afib.    Today, 04/05/2015:  Baseline PT slightly elevated, INR WNL   CBC: Hgb dropped to 7.0, plts remain WNL, no bleeding per RN and MD.  Confirmed with MD to continue IV heparin for now, plan for change to oral anticoagulation possibly later today  First heparin level slightly subtherapeutic  SCr 1.2, CrCl ~  52 ml/min  Goal of Therapy:  Heparin level 0.3-0.7 units/ml Monitor platelets by anticoagulation protocol: Yes   Plan:   Increase IV heparin level to 1200 units/hr = 12 ml/hr, being conservative with rate increase due to low hemoglobin  Heparin level in 8 hours  Daily heparin level and CBC  Continue to monitor H&H and platelets  Ralene Bathe, PharmD, BCPS 04/05/2015, 9:42 AM  Pager: [phone]

## 2015-04-06 LAB — URINE CULTURE

## 2015-04-06 LAB — BASIC METABOLIC PANEL
Anion gap: 8 (ref 5–15)
BUN: 20 mg/dL (ref 6–20)
CALCIUM: 8.5 mg/dL — AB (ref 8.9–10.3)
CO2: 25 mmol/L (ref 22–32)
CREATININE: 1.3 mg/dL — AB (ref 0.61–1.24)
Chloride: 103 mmol/L (ref 101–111)
GFR calc Af Amer: 58 mL/min — ABNORMAL LOW (ref >60)
GFR calc non Af Amer: 50 mL/min — ABNORMAL LOW (ref >60)
Glucose, Bld: 128 mg/dL — ABNORMAL HIGH (ref 65–99)
Potassium: 3.7 mmol/L (ref 3.5–5.1)
Sodium: 136 mmol/L (ref 135–145)

## 2015-04-06 LAB — GLUCOSE, CAPILLARY: GLUCOSE-CAPILLARY: 108 mg/dL — AB (ref 65–99)

## 2015-04-06 LAB — HEMOGLOBIN A1C
Hgb A1c MFr Bld: 7.4 % — ABNORMAL HIGH (ref 4.8–5.6)
Mean Plasma Glucose: 166 mg/dL

## 2015-04-06 MED ORDER — CALCIUM CARBONATE ANTACID 500 MG PO CHEW
2.0000 | CHEWABLE_TABLET | Freq: Three times a day (TID) | ORAL | Status: DC
  Filled 2015-04-06 (×3): qty 2

## 2015-04-06 MED ORDER — METOPROLOL TARTRATE 25 MG PO TABS
25.0000 mg | ORAL_TABLET | Freq: Two times a day (BID) | ORAL | Status: DC
  Administered 2015-04-06 – 2015-04-10 (×8): 25 mg via ORAL
  Filled 2015-04-06 (×12): qty 1

## 2015-04-06 MED ORDER — PANTOPRAZOLE SODIUM 40 MG IV SOLR
40.0000 mg | Freq: Every day | INTRAVENOUS | Status: DC
  Administered 2015-04-06 – 2015-04-10 (×5): 40 mg via INTRAVENOUS
  Filled 2015-04-06 (×5): qty 40

## 2015-04-06 NOTE — Progress Notes (Signed)
[facility] PROGRESS NOTE   Jim Beck [id number] DOB: 02/17/1933 DOA: 04/04/2015 PCP: Kandice Hams, MD  HPI/Subjective: Feels okay, hemoglobin increased to 8.9 after transfusion. Denies any shortness of breath, no palpitations.  Assessment/Plan: Principal Problem:   HCAP (healthcare-associated pneumonia) Active Problems:   Hodgkin lymphoma ([place])   Weakness   Anemia   COPD (chronic obstructive pulmonary disease) (HCC)   HTN (hypertension)   HLD (hyperlipidemia)   Protein-calorie malnutrition, severe   HCAP Presented with shortness of breath, hypoxia, CT scan showed infiltration in the LUL. Started on Zosyn and vancomycin, blood and sputum cultures sent. Urine Legionella and Streptococcus antigen pending. Continue supportive management with bronchodilators, mucolytics, antitussives and oxygen as needed.  Acute on chronic respiratory failure with hypoxia Patient is on 2 L of oxygen at home continuously (reported he was placed recently on that). Currently needs 4 L of oxygen to keep his oxygen saturation in the mid 90s. Hypoxemia is likely secondary to the healthcare associated pneumonia.  Acute kidney injury Creatinine is slightly elevated at 1.35 on admission, went up to peak of 1.5. Creatinine baseline is 1.0 from 03/13/2015, check BMP in a.m.  Atrial fibrillation With rapid ventricular response, heart rate is in the 130s. Responded to boluses of IV metoprolol, started on IV heparin. Discontinued IV heparin drip and I'll start on Eliquis twice a day. 2-D echo pending 2-D echo showed ejection fraction of 55-60% Start on 25 mg of oral metoprolol twice a day.  Anemia Presented with hemoglobin of 7.0, Secondary to recent chemotherapy, will transfuse 2 units of packed RBCs. Status post transfusion of 2 units of packed RBCs, hemoglobin is 8.9 this morning.  Generalized weakness -likely due to combination of pneumonia and chemo -will start on therapy as  noted above  Hodgkins Lymphoma -per oncology  COPD -duonebs as needed  HTN -will monitor pressures -currently not on any medications  -reportedly on the chart he is to be on amlodipine  Gout -will continue with allopurinol    Code Status: Full Code Family Communication: Plan discussed with the patient. Disposition Plan: Continue antibiotics need 2-3 days of IV antibiotics prior to discharge. Diet: Diet heart healthy/carb modified Room service appropriate?: Yes; Fluid consistency:: Thin  Consultants:  None  Procedures:  None  Antibiotics:  None   Objective: Filed Vitals:   04/05/15 2235 04/06/15 0544  BP: 126/87 110/56  Pulse:  77  Temp: 99.2 F (37.3 C) 98.5 F (36.9 C)  Resp: 20 20    Intake/Output Summary (Last 24 hours) at 04/06/15 1130 Last data filed at 04/06/15 0941  Gross per 24 hour  Intake    844 ml  Output    975 ml  Net   -131 ml   Filed Weights   04/05/15 0432  Weight: 83.7 kg (184 lb 8.4 oz)    Exam: General: Alert and awake, oriented x3, not in any acute distress. HEENT: anicteric sclera, pupils reactive to light and accommodation, EOMI CVS: S1-S2 clear, no murmur rubs or gallops Chest: clear to auscultation bilaterally, no wheezing, rales or rhonchi Abdomen: soft nontender, nondistended, normal bowel sounds, no organomegaly Extremities: no cyanosis, clubbing or edema noted bilaterally Neuro: Cranial nerves II-XII intact, no focal neurological deficits  Data Reviewed: Basic Metabolic Panel:  Recent Labs Lab 04/04/15 0200 04/04/15 1745 04/04/15 1754 04/06/15 0830  NA 138 139 140 136  K 4.3 4.3 4.2 3.7  CL 107 107 106 103  CO2 24 25  --  25  GLUCOSE 147* 131* 127*  128*  BUN 25* 27* 28* 20  CREATININE 1.35* 1.51* 1.20 1.30*  CALCIUM 8.4* 8.6*  --  8.5*   Liver Function Tests:  Recent Labs Lab 04/04/15 0200 04/04/15 1745  AST 15 16  ALT 14* 15*  ALKPHOS 49 48  BILITOT 0.4 0.5  PROT 6.0* 5.9*  ALBUMIN 2.7* 2.5*    No results for input(s): LIPASE, AMYLASE in the last 168 hours. No results for input(s): AMMONIA in the last 168 hours. CBC:  Recent Labs Lab 04/04/15 0200 04/04/15 1745 04/04/15 1754 04/05/15 0810 04/06/15 0830  WBC 5.1 4.7  --  4.2 4.0  NEUTROABS 3.7 3.6  --   --   --   HGB 7.4* 7.4* 8.8* 7.0* 8.9*  HCT 24.4* 24.4* 26.0* 23.4* 29.1*  MCV 78.0 77.7*  --  78.3 79.3  PLT 250 263  --  247 233   Cardiac Enzymes: No results for input(s): CKTOTAL, CKMB, CKMBINDEX, TROPONINI in the last 168 hours. BNP (last 3 results) No results for input(s): BNP in the last 8760 hours.  ProBNP (last 3 results) No results for input(s): PROBNP in the last 8760 hours.  CBG:  Recent Labs Lab 04/05/15 0734 04/06/15 0745  GLUCAP 103* 108*    Micro Recent Results (from the past 240 hour(s))  Urine culture     Status: None   Collection Time: 04/04/15  8:42 PM  Result Value Ref Range Status   Specimen Description URINE, CLEAN CATCH  Final   Special Requests NONE  Final   Culture   Final    3,000 COLONIES/mL INSIGNIFICANT GROWTH Performed at [facility]    Report Status 04/06/2015 FINAL  Final  Culture, sputum-assessment     Status: None   Collection Time: 04/05/15  6:31 AM  Result Value Ref Range Status   Specimen Description SPUTUM  Final   Special Requests NONE  Final   Sputum evaluation   Final    MICROSCOPIC FINDINGS SUGGEST THAT THIS SPECIMEN IS NOT REPRESENTATIVE OF LOWER RESPIRATORY SECRETIONS. PLEASE RECOLLECT. NOTIFIED [name] AT [id number] ON 12.9.16 BY SHUEA    Report Status 04/05/2015 FINAL  Final  Culture, expectorated sputum-assessment     Status: None   Collection Time: 04/05/15  5:04 PM  Result Value Ref Range Status   Specimen Description SPUTUM  Final   Special Requests Immunocompromised  Final   Sputum evaluation   Final    THIS SPECIMEN IS ACCEPTABLE. RESPIRATORY CULTURE REPORT TO FOLLOW.   Report Status 04/05/2015 FINAL  Final     Studies: Dg Chest 2  View  04/04/2015  CLINICAL DATA:  81 year old male with chest pain and cough for 8 months. Hodgkin's lymphoma. Hypertension. EXAM: CHEST  2 VIEW COMPARISON:  02/17/2012 chest x-ray.  11/20/2014 PET CT. FINDINGS: Right central line tip distal superior vena cava without pneumothorax. Large hiatal hernia. Mild cardiomegaly. Asymmetric airspace disease greater on the left has progressed since prior exam. Pulmonary vascular congestion may partially contribute to this finding however patchy infiltrate or tumor within left lung may be present. IMPRESSION: Asymmetric airspace disease greater on the left has progressed since prior exam. Pulmonary vascular congestion may partially contribute to this finding however patchy infiltrate or tumor within left lung may be present. Large hiatal hernia. Electronically Signed   By: Genia Del M.D.   On: 04/04/2015 18:13   Ct Angio Chest Pe W/cm &/or Wo Cm  04/04/2015  CLINICAL DATA:  Weakness, chest pain and cough for 8 months. Lymphoma. EXAM:  CT ANGIOGRAPHY CHEST WITH CONTRAST TECHNIQUE: Multidetector CT imaging of the chest was performed using the standard protocol during bolus administration of intravenous contrast. Multiplanar CT image reconstructions and MIPs were obtained to evaluate the vascular anatomy. CONTRAST:  168mL OMNIPAQUE IOHEXOL 350 MG/ML SOLN COMPARISON:  Radiographs 04/04/2015. CT 08/27/2014. PET-CT 11/20/2014. FINDINGS: Mediastinum: The pulmonary arteries are well opacified with contrast. There is no evidence of acute pulmonary embolism. There is central enlargement of the pulmonary arteries and right cardiac chamber dilatation consistent with pulmonary arterial hypertension. There is mild aortic atherosclerosis.Port-A-Cath tip in the right atrium. Previously demonstrated hypermetabolic left axillary adenopathy has improved with the largest remaining node measuring 8 mm short axis on image 25. There are scattered small mediastinal and hilar lymph nodes which  are similar to prior CT, not significantly hypermetabolic on PET-CT. There is a moderate to large hiatal hernia. Lungs/Pleura: Small dependent right pleural effusion.Severe emphysema with interval superimposed patchy ground-glass opacities throughout both lungs as demonstrated on today's radiographs. There is relative sparing within the right lower lobe. Components in the left upper lobe are slightly more confluent, suspicious for pneumonia. No well-defined mass or suspicious nodule. Upper abdomen: No significant findings visualized. There is reflux of contrast into the IVC and hepatic veins consistent with right heart failure. Musculoskeletal/Chest wall: No chest wall lesion or acute osseous findings. Review of the MIP images confirms the above findings. IMPRESSION: 1. No evidence of acute pulmonary embolism. 2. Right cardiac chamber enlargement and central enlargement of the pulmonary arteries consistent with pulmonary arterial hypertension. 3. Severe emphysema with superimposed patchy ground-glass opacities in both lungs. Findings could be secondary to asymmetric edema, although there is a more confluent component in the left upper lobe suspicious for pneumonia. Small right pleural effusion. 4. Improvement in previously demonstrated hypermetabolic left axillary adenopathy consistent with treated lymphoma. 5. Moderate to large hiatal hernia. Electronically Signed   By: Richardean Sale M.D.   On: 04/04/2015 20:10    Scheduled Meds: . allopurinol  100 mg Oral BID  . apixaban  5 mg Oral BID  . aspirin EC  81 mg Oral q morning - 10a  . feeding supplement (ENSURE ENLIVE)  237 mL Oral BID BM  . folic acid  1 mg Oral Daily  . multivitamin with minerals  1 tablet Oral Daily  . piperacillin-tazobactam (ZOSYN)  IV  3.375 g Intravenous 3 times per day  . rosuvastatin  10 mg Oral q1800  . thiamine  100 mg Oral Daily  . vancomycin  750 mg Intravenous Q12H   Continuous Infusions:       Time spent: 35  minutes    [facility] A  [facility] Pager [phone] If 7PM-7AM, please contact night-coverage at [email or web address], password [facility] 04/06/2015, 11:30 AM  LOS: 2 days

## 2015-04-06 NOTE — Progress Notes (Signed)
Writer noted pt's HR to be 156 -- gave ordered Lopressor 10mg  IV per HR>130. Alerted MD. Pt sitting upright in bed eating lunch. No s/s of distress noted. Pt states he is in no pain. Will continue to monitor. Pt is on telemetry. Has history of "2 heart attacks" and "murmur"; per pt.

## 2015-04-06 NOTE — Discharge Instructions (Signed)
Information on my medicine - ELIQUIS® (apixaban) ° °This medication education was reviewed with me or my healthcare representative as part of my discharge preparation.  The pharmacist that spoke with me during my hospital stay was:  Vola Beneke R, RPH ° °Why was Eliquis® prescribed for you? °Eliquis® was prescribed for you to reduce the risk of a blood clot forming that can cause a stroke if you have a medical condition called atrial fibrillation (a type of irregular heartbeat). ° °What do You need to know about Eliquis® ? °Take your Eliquis® TWICE DAILY - one tablet in the morning and one tablet in the evening with or without food. If you have difficulty swallowing the tablet whole please discuss with your pharmacist how to take the medication safely. ° °Take Eliquis® exactly as prescribed by your doctor and DO NOT stop taking Eliquis® without talking to the doctor who prescribed the medication.  Stopping may increase your risk of developing a stroke.  Refill your prescription before you run out. ° °After discharge, you should have regular check-up appointments with your healthcare provider that is prescribing your Eliquis®.  In the future your dose may need to be changed if your kidney function or weight changes by a significant amount or as you get older. ° °What do you do if you miss a dose? °If you miss a dose, take it as soon as you remember on the same day and resume taking twice daily.  Do not take more than one dose of ELIQUIS at the same time to make up a missed dose. ° °Important Safety Information °A possible side effect of Eliquis® is bleeding. You should call your healthcare provider right away if you experience any of the following: °Bleeding from an injury or your nose that does not stop. °Unusual colored urine (red or dark brown) or unusual colored stools (red or black). °Unusual bruising for unknown reasons. °A serious fall or if you hit your head (even if there is no bleeding). ° °Some  medicines may interact with Eliquis® and might increase your risk of bleeding or clotting while on Eliquis®. To help avoid this, consult your healthcare provider or pharmacist prior to using any new prescription or non-prescription medications, including herbals, vitamins, non-steroidal anti-inflammatory drugs (NSAIDs) and supplements. ° °This website has more information on Eliquis® (apixaban): http://www.eliquis.com/eliquis/home  °

## 2015-04-07 LAB — BASIC METABOLIC PANEL
ANION GAP: 5 (ref 5–15)
BUN: 26 mg/dL — ABNORMAL HIGH (ref 6–20)
CHLORIDE: 102 mmol/L (ref 101–111)
CO2: 27 mmol/L (ref 22–32)
Calcium: 8.5 mg/dL — ABNORMAL LOW (ref 8.9–10.3)
Creatinine, Ser: 1.31 mg/dL — ABNORMAL HIGH (ref 0.61–1.24)
GFR calc non Af Amer: 50 mL/min — ABNORMAL LOW (ref >60)
GFR, EST AFRICAN AMERICAN: 58 mL/min — AB (ref >60)
Glucose, Bld: 105 mg/dL — ABNORMAL HIGH (ref 65–99)
POTASSIUM: 3.9 mmol/L (ref 3.5–5.1)
SODIUM: 134 mmol/L — AB (ref 135–145)

## 2015-04-07 LAB — VANCOMYCIN, TROUGH: Vancomycin Tr: 19 ug/mL (ref 10.0–20.0)

## 2015-04-07 LAB — CBC
HEMATOCRIT: 27.4 % — AB (ref 39.0–52.0)
Hemoglobin: 8.3 g/dL — ABNORMAL LOW (ref 13.0–17.0)
MCH: 23.8 pg — ABNORMAL LOW (ref 26.0–34.0)
MCHC: 30.3 g/dL (ref 30.0–36.0)
MCV: 78.5 fL (ref 78.0–100.0)
PLATELETS: 249 10*3/uL (ref 150–400)
RBC: 3.49 MIL/uL — AB (ref 4.22–5.81)
RDW: 22.8 % — ABNORMAL HIGH (ref 11.5–15.5)
WBC: 3.3 10*3/uL — AB (ref 4.0–10.5)

## 2015-04-07 LAB — TYPE AND SCREEN
ABO/RH(D): O POS
Antibody Screen: NEGATIVE
UNIT DIVISION: 0
UNIT DIVISION: 0

## 2015-04-07 LAB — GLUCOSE, CAPILLARY: Glucose-Capillary: 138 mg/dL — ABNORMAL HIGH (ref 65–99)

## 2015-04-07 MED ORDER — MAGNESIUM HYDROXIDE 400 MG/5ML PO SUSP
30.0000 mL | Freq: Once | ORAL | Status: AC
  Administered 2015-04-07: 30 mL via ORAL
  Filled 2015-04-07: qty 30

## 2015-04-07 MED ORDER — GI COCKTAIL ~~LOC~~
30.0000 mL | Freq: Three times a day (TID) | ORAL | Status: DC | PRN
  Administered 2015-04-07 (×2): 30 mL via ORAL
  Filled 2015-04-07 (×3): qty 30

## 2015-04-07 MED ORDER — DILTIAZEM HCL ER COATED BEADS 180 MG PO CP24
180.0000 mg | ORAL_CAPSULE | Freq: Every day | ORAL | Status: DC
  Administered 2015-04-07 – 2015-04-10 (×4): 180 mg via ORAL
  Filled 2015-04-07 (×4): qty 1

## 2015-04-07 MED ORDER — POLYETHYLENE GLYCOL 3350 17 G PO PACK
17.0000 g | PACK | Freq: Every day | ORAL | Status: DC
  Administered 2015-04-07 – 2015-04-10 (×3): 17 g via ORAL
  Filled 2015-04-07 (×4): qty 1

## 2015-04-07 NOTE — Progress Notes (Signed)
[facility] PROGRESS NOTE   Jim Beck [id number] DOB: 1933/02/13 DOA: 04/04/2015 PCP: Kandice Hams, MD  HPI/Subjective: Heart rate goes up and down, on metoprolol cannot increase because of marginal blood pressure. Breathing is okay complaining about constipation, MiraLAX and MOM given. Try to wean oxygen down.  Assessment/Plan: Principal Problem:   HCAP (healthcare-associated pneumonia) Active Problems:   Hodgkin lymphoma ([place])   Weakness   Anemia   COPD (chronic obstructive pulmonary disease) (HCC)   HTN (hypertension)   HLD (hyperlipidemia)   Protein-calorie malnutrition, severe   HCAP Presented with shortness of breath, hypoxia, CT scan showed infiltrates in the LUL. Started on Zosyn and vancomycin, blood and sputum cultures sent. Urine Legionella and Streptococcus antigen pending. Continue supportive management with bronchodilators, mucolytics, antitussives and oxygen as needed.  Acute on chronic respiratory failure with hypoxia Patient is on 2 L of oxygen at home continuously (reported he was placed recently on that). Currently needs 4 L of oxygen to keep his oxygen saturation in the mid 90s. Hypoxemia is likely secondary to the healthcare associated pneumonia.  Acute kidney injury Creatinine is slightly elevated at 1.35 on admission, went up to peak of 1.5. Creatinine baseline is 1.0 from 03/13/2015, check BMP in a.m.  Atrial fibrillation With rapid ventricular response, heart rate is in the 130s. Responded to boluses of IV metoprolol, started on IV heparin. Discontinued IV heparin drip and I'll start on Eliquis twice a day. 2-D echo pending 2-D echo showed ejection fraction of 55-60% Start on 25 mg of oral metoprolol twice a day, I will add Cardizem because of the marginal low blood pressure  Anemia Presented with hemoglobin of 7.0, Secondary to recent chemotherapy, will transfuse 2 units of packed RBCs. Status post transfusion of 2 units of  packed RBCs, hemoglobin is 8.9 this morning.  Generalized weakness -likely due to combination of pneumonia and chemo -will start on therapy as noted above, PT/OT to evaluate and treat.  Hodgkins Lymphoma -per oncology  COPD -duonebs as needed  HTN -will monitor pressures -currently not on any medications  -reportedly on the chart he is to be on amlodipine  Gout -will continue with allopurinol    Code Status: Full Code Family Communication: Plan discussed with the patient. Disposition Plan: Continue antibiotics need 2-3 days of IV antibiotics prior to discharge. Diet: Diet heart healthy/carb modified Room service appropriate?: Yes; Fluid consistency:: Thin  Consultants:  None  Procedures:  None  Antibiotics:  None   Objective: Filed Vitals:   04/07/15 0208 04/07/15 0529  BP: 99/68 91/59  Pulse: 77 78  Temp:  99.4 F (37.4 C)  Resp: 16 16    Intake/Output Summary (Last 24 hours) at 04/07/15 1047 Last data filed at 04/07/15 1040  Gross per 24 hour  Intake    720 ml  Output    550 ml  Net    170 ml   Filed Weights   04/05/15 0432  Weight: 83.7 kg (184 lb 8.4 oz)    Exam: General: Alert and awake, oriented x3, not in any acute distress. HEENT: anicteric sclera, pupils reactive to light and accommodation, EOMI CVS: S1-S2 clear, no murmur rubs or gallops Chest: clear to auscultation bilaterally, no wheezing, rales or rhonchi Abdomen: soft nontender, nondistended, normal bowel sounds, no organomegaly Extremities: no cyanosis, clubbing or edema noted bilaterally Neuro: Cranial nerves II-XII intact, no focal neurological deficits  Data Reviewed: Basic Metabolic Panel:  Recent Labs Lab 04/04/15 0200 04/04/15 1745 04/04/15 1754 04/06/15 0830 04/07/15 0500  NA 138 139 140 136 134*  K 4.3 4.3 4.2 3.7 3.9  CL 107 107 106 103 102  CO2 24 25  --  25 27  GLUCOSE 147* 131* 127* 128* 105*  BUN 25* 27* 28* 20 26*  CREATININE 1.35* 1.51* 1.20 1.30*  1.31*  CALCIUM 8.4* 8.6*  --  8.5* 8.5*   Liver Function Tests:  Recent Labs Lab 04/04/15 0200 04/04/15 1745  AST 15 16  ALT 14* 15*  ALKPHOS 49 48  BILITOT 0.4 0.5  PROT 6.0* 5.9*  ALBUMIN 2.7* 2.5*   No results for input(s): LIPASE, AMYLASE in the last 168 hours. No results for input(s): AMMONIA in the last 168 hours. CBC:  Recent Labs Lab 04/04/15 0200 04/04/15 1745 04/04/15 1754 04/05/15 0810 04/06/15 0830 04/07/15 0500  WBC 5.1 4.7  --  4.2 4.0 3.3*  NEUTROABS 3.7 3.6  --   --   --   --   HGB 7.4* 7.4* 8.8* 7.0* 8.9* 8.3*  HCT 24.4* 24.4* 26.0* 23.4* 29.1* 27.4*  MCV 78.0 77.7*  --  78.3 79.3 78.5  PLT 250 263  --  247 233 249   Cardiac Enzymes: No results for input(s): CKTOTAL, CKMB, CKMBINDEX, TROPONINI in the last 168 hours. BNP (last 3 results) No results for input(s): BNP in the last 8760 hours.  ProBNP (last 3 results) No results for input(s): PROBNP in the last 8760 hours.  CBG:  Recent Labs Lab 04/05/15 0734 04/06/15 0745 04/07/15 0720  GLUCAP 103* 108* 138*    Micro Recent Results (from the past 240 hour(s))  Culture, blood (x 2)     Status: None (Preliminary result)   Collection Time: 04/04/15  1:30 AM  Result Value Ref Range Status   Specimen Description BLOOD RIGHT [facility]  Final   Special Requests IN PEDIATRIC BOTTLE [place]  Final   Culture   Final    NO GROWTH 1 DAY Performed at [facility]    Report Status PENDING  Incomplete  Culture, blood (x 2)     Status: None (Preliminary result)   Collection Time: 04/04/15  1:44 AM  Result Value Ref Range Status   Specimen Description BLOOD RIGHT HAND  Final   Special Requests BOTTLES DRAWN AEROBIC ONLY 5CC  Final   Culture   Final    NO GROWTH 1 DAY Performed at [facility]    Report Status PENDING  Incomplete  Urine culture     Status: None   Collection Time: 04/04/15  8:42 PM  Result Value Ref Range Status   Specimen Description URINE, CLEAN CATCH  Final   Special  Requests NONE  Final   Culture   Final    3,000 COLONIES/mL INSIGNIFICANT GROWTH Performed at [facility]    Report Status 04/06/2015 FINAL  Final  Culture, sputum-assessment     Status: None   Collection Time: 04/05/15  6:31 AM  Result Value Ref Range Status   Specimen Description SPUTUM  Final   Special Requests NONE  Final   Sputum evaluation   Final    MICROSCOPIC FINDINGS SUGGEST THAT THIS SPECIMEN IS NOT REPRESENTATIVE OF LOWER RESPIRATORY SECRETIONS. PLEASE RECOLLECT. NOTIFIED [name] AT [id number] ON 12.9.16 BY SHUEA    Report Status 04/05/2015 FINAL  Final  Culture, expectorated sputum-assessment     Status: None   Collection Time: 04/05/15  5:04 PM  Result Value Ref Range Status   Specimen Description SPUTUM  Final   Special Requests  Immunocompromised  Final   Sputum evaluation   Final    THIS SPECIMEN IS ACCEPTABLE. RESPIRATORY CULTURE REPORT TO FOLLOW.   Report Status 04/05/2015 FINAL  Final     Studies: No results found.  Scheduled Meds: . allopurinol  100 mg Oral BID  . apixaban  5 mg Oral BID  . aspirin EC  81 mg Oral q morning - 10a  . feeding supplement (ENSURE ENLIVE)  237 mL Oral BID BM  . folic acid  1 mg Oral Daily  . magnesium hydroxide  30 mL Oral Once  . metoprolol tartrate  25 mg Oral BID  . multivitamin with minerals  1 tablet Oral Daily  . pantoprazole (PROTONIX) IV  40 mg Intravenous Daily  . piperacillin-tazobactam (ZOSYN)  IV  3.375 g Intravenous 3 times per day  . polyethylene glycol  17 g Oral Daily  . rosuvastatin  10 mg Oral q1800  . thiamine  100 mg Oral Daily  . vancomycin  750 mg Intravenous Q12H   Continuous Infusions:       Time spent: 35 minutes    [facility] A  [facility] Pager [phone] If 7PM-7AM, please contact night-coverage at [email or web address], password [facility] 04/07/2015, 10:47 AM  LOS: 3 days

## 2015-04-07 NOTE — Progress Notes (Signed)
Pharmacy Antibiotic Follow-up Note  Jim Beck is a 81 y.o. year-old male admitted on 04/04/2015.  The patient is currently on day 4 of Vancomycin and Zosyn for HCAP.  Assessment/Plan:  Continue Vancomycin 750mg  IV q12h  Continue Zosyn 3.375gm IV q8h (4hr extended infusions)  Continue to follow up renal function & cultures  Recommend narrowing to Ceftin 500mg  PO q12h since cultures remain negative at 72 hrs  Temp (24hrs), Avg:98.9 F (37.2 C), Min:98.4 F (36.9 C), Max:99.4 F (37.4 C)   Recent Labs Lab 04/04/15 0200 04/04/15 1745 04/05/15 0810 04/06/15 0830 04/07/15 0500  WBC 5.1 4.7 4.2 4.0 3.3*    Recent Labs Lab 04/04/15 0200 04/04/15 1745 04/04/15 1754 04/06/15 0830 04/07/15 0500  CREATININE 1.35* 1.51* 1.20 1.30* 1.31*   Estimated Creatinine Clearance: 47.9 mL/min (by C-G formula based on Cr of 1.31).    Allergies  Allergen Reactions  . Aspirin     325mg  gives him shakes but low dose 81mg  is fine    Antimicrobials this admission: 12/8 >> Vancomycin >> 12/8 >> Zosyn >>  Microbiology results: 12/8 bldx2: ngtd 12/8 urine: 3k insignificant growth 12/9 sputum: needs recollect 12/9 strep/legionella: neg/IP 12/9 influenza: neg  Levels/dose changes this admission: 12/11: VT at 11:00 = 19 mcg/ml on 750mg  q12h (drawn ~10hr after previous dose), no change  Thank you for allowing pharmacy to be a part of this patient's care.  Peggyann Juba, PharmD, BCPS Pager: [phone]  04/07/2015 12:03 PM

## 2015-04-08 DIAGNOSIS — A419 Sepsis, unspecified organism: Secondary | ICD-10-CM

## 2015-04-08 DIAGNOSIS — I4891 Unspecified atrial fibrillation: Secondary | ICD-10-CM | POA: Diagnosis present

## 2015-04-08 LAB — BASIC METABOLIC PANEL
ANION GAP: 5 (ref 5–15)
BUN: 26 mg/dL — ABNORMAL HIGH (ref 6–20)
CALCIUM: 8.3 mg/dL — AB (ref 8.9–10.3)
CO2: 28 mmol/L (ref 22–32)
CREATININE: 1.31 mg/dL — AB (ref 0.61–1.24)
Chloride: 100 mmol/L — ABNORMAL LOW (ref 101–111)
GFR calc Af Amer: 58 mL/min — ABNORMAL LOW (ref >60)
GFR, EST NON AFRICAN AMERICAN: 50 mL/min — AB (ref >60)
Glucose, Bld: 128 mg/dL — ABNORMAL HIGH (ref 65–99)
Potassium: 4.2 mmol/L (ref 3.5–5.1)
SODIUM: 133 mmol/L — AB (ref 135–145)

## 2015-04-08 LAB — CBC
HCT: 29.1 % — ABNORMAL LOW (ref 39.0–52.0)
HCT: 27.6 % — ABNORMAL LOW (ref 39.0–52.0)
HEMOGLOBIN: 8.4 g/dL — AB (ref 13.0–17.0)
Hemoglobin: 8.9 g/dL — ABNORMAL LOW (ref 13.0–17.0)
MCH: 24.3 pg — AB (ref 26.0–34.0)
MCH: 24.1 pg — AB (ref 26.0–34.0)
MCHC: 30.6 g/dL (ref 30.0–36.0)
MCHC: 30.4 g/dL (ref 30.0–36.0)
MCV: 79.3 fL (ref 78.0–100.0)
MCV: 79.1 fL (ref 78.0–100.0)
PLATELETS: 233 10*3/uL (ref 150–400)
PLATELETS: 264 10*3/uL (ref 150–400)
RBC: 3.67 MIL/uL — ABNORMAL LOW (ref 4.22–5.81)
RBC: 3.49 MIL/uL — AB (ref 4.22–5.81)
RDW: 23 % — AB (ref 11.5–15.5)
RDW: 23.3 % — ABNORMAL HIGH (ref 11.5–15.5)
WBC: 4 10*3/uL (ref 4.0–10.5)
WBC: 3.2 10*3/uL — ABNORMAL LOW (ref 4.0–10.5)

## 2015-04-08 LAB — LEGIONELLA ANTIGEN, URINE

## 2015-04-08 LAB — CULTURE, RESPIRATORY
CULTURE: NORMAL
GRAM STAIN: NONE SEEN

## 2015-04-08 LAB — CULTURE, RESPIRATORY W GRAM STAIN

## 2015-04-08 LAB — GLUCOSE, CAPILLARY: GLUCOSE-CAPILLARY: 99 mg/dL (ref 65–99)

## 2015-04-08 NOTE — Care Management Note (Signed)
Case Management Note  Patient Details  Name: Linken Greear MRN: [id number] Date of Birth: 1933-01-23  Subjective/Objective:             pna in chemo patient       Action/Plan:Date: April 08, 2015 Chart reviewed for concurrent status and case management needs. Will continue to follow patient for changes and needs: Velva Harman, RN, BSN, [place]   [phone]   Expected Discharge Date:   (unknown)               Expected Discharge Plan:  Home/Self Care  In-House Referral:  NA  Discharge planning Services  CM Consult  Post Acute Care Choice:  NA Choice offered to:  NA  DME Arranged:    DME Agency:     HH Arranged:    HH Agency:     Status of Service:  In process, will continue to follow  Medicare Important Message Given:    Date Medicare IM Given:    Medicare IM give by:    Date Additional Medicare IM Given:    Additional Medicare Important Message give by:     If discussed at [place] of Stay Meetings, dates discussed:    Additional Comments:  Leeroy Cha, RN 04/08/2015, 2:35 PM

## 2015-04-08 NOTE — Progress Notes (Signed)
[facility] PROGRESS NOTE   Jim Beck [id number] DOB: 12-29-1932 DOA: 04/04/2015 PCP: Kandice Hams, MD  HPI/Subjective: Better heart rate control after addition of Cardizem CD. Feels weak, PT recommended SNF placement, have asked CSW to evaluate the patient.  Assessment/Plan: Principal Problem:   HCAP (healthcare-associated pneumonia) Active Problems:   Hodgkin lymphoma ([facility])   Weakness   Anemia   COPD (chronic obstructive pulmonary disease) (HCC)   HTN (hypertension)   HLD (hyperlipidemia)   Protein-calorie malnutrition, severe   HCAP Presented with shortness of breath, hypoxia, CT scan showed infiltrates in the LUL. Started on Zosyn and vancomycin, blood and sputum cultures sent. Urine Legionella and Streptococcus antigen pending. Continue supportive management with bronchodilators, mucolytics, antitussives and oxygen as needed.  Acute on chronic respiratory failure with hypoxia Patient is on 2 L of oxygen at home continuously (reported he was placed recently on that). Currently needs 4 L of oxygen to keep his oxygen saturation in the mid 90s. Hypoxemia is likely secondary to the healthcare associated pneumonia.  Acute kidney injury Creatinine is slightly elevated at 1.35 on admission, went up to peak of 1.5. Creatinine baseline is 1.0 from 03/13/2015, check BMP in a.m.  Atrial fibrillation With rapid ventricular response, heart rate is in the 130s. Responded to boluses of IV metoprolol, started on IV heparin. Discontinued IV heparin drip and I'll start on Eliquis twice a day. 2-D echo pending 2-D echo showed ejection fraction of 55-60% Start on 25 mg of oral metoprolol twice a day, I will add Cardizem because of the marginal low blood pressure  Anemia Presented with hemoglobin of 7.0, Secondary to recent chemotherapy, will transfuse 2 units of packed RBCs. Status post transfusion of 2 units of packed RBCs, hemoglobin is 8.9 this  morning.  Generalized weakness -likely due to combination of pneumonia and chemo -will start on therapy as noted above, PT/OT to evaluate and treat.  Hodgkins Lymphoma -per oncology  COPD -duonebs as needed  HTN -will monitor pressures -currently not on any medications  -reportedly on the chart he is to be on amlodipine  Gout -will continue with allopurinol    Code Status: Full Code Family Communication: Plan discussed with the patient. Disposition Plan: Likely can be discharged to SNF in a.m. If continues to be afebrile. Diet: Diet heart healthy/carb modified Room service appropriate?: Yes; Fluid consistency:: Thin  Consultants:  None  Procedures:  None  Antibiotics:  None   Objective: Filed Vitals:   04/07/15 2135 04/08/15 0439  BP: 97/67 95/60  Pulse: 71 71  Temp: 98.7 F (37.1 C) 99.3 F (37.4 C)  Resp: 16 16    Intake/Output Summary (Last 24 hours) at 04/08/15 1210 Last data filed at 04/08/15 0730  Gross per 24 hour  Intake    240 ml  Output    950 ml  Net   -710 ml   Filed Weights   04/05/15 0432 04/08/15 0613  Weight: 83.7 kg (184 lb 8.4 oz) 82 kg (180 lb 12.4 oz)    Exam: General: Alert and awake, oriented x3, not in any acute distress. HEENT: anicteric sclera, pupils reactive to light and accommodation, EOMI CVS: S1-S2 clear, no murmur rubs or gallops Chest: clear to auscultation bilaterally, no wheezing, rales or rhonchi Abdomen: soft nontender, nondistended, normal bowel sounds, no organomegaly Extremities: no cyanosis, clubbing or edema noted bilaterally Neuro: Cranial nerves II-XII intact, no focal neurological deficits  Data Reviewed: Basic Metabolic Panel:  Recent Labs Lab 04/04/15 0200 04/04/15 1745 04/04/15 1754  04/06/15 0830 04/07/15 0500 04/08/15 0420  NA 138 139 140 136 134* 133*  K 4.3 4.3 4.2 3.7 3.9 4.2  CL 107 107 106 103 102 100*  CO2 24 25  --  25 27 28   GLUCOSE 147* 131* 127* 128* 105* 128*  BUN 25*  27* 28* 20 26* 26*  CREATININE 1.35* 1.51* 1.20 1.30* 1.31* 1.31*  CALCIUM 8.4* 8.6*  --  8.5* 8.5* 8.3*   Liver Function Tests:  Recent Labs Lab 04/04/15 0200 04/04/15 1745  AST 15 16  ALT 14* 15*  ALKPHOS 49 48  BILITOT 0.4 0.5  PROT 6.0* 5.9*  ALBUMIN 2.7* 2.5*   No results for input(s): LIPASE, AMYLASE in the last 168 hours. No results for input(s): AMMONIA in the last 168 hours. CBC:  Recent Labs Lab 04/04/15 0200 04/04/15 1745 04/04/15 1754 04/05/15 0810 04/06/15 0830 04/07/15 0500 04/08/15 0420  WBC 5.1 4.7  --  4.2 4.0 3.3* 3.2*  NEUTROABS 3.7 3.6  --   --   --   --   --   HGB 7.4* 7.4* 8.8* 7.0* 8.9* 8.3* 8.4*  HCT 24.4* 24.4* 26.0* 23.4* 29.1* 27.4* 27.6*  MCV 78.0 77.7*  --  78.3 79.3 78.5 79.1  PLT 250 263  --  247 233 249 264   Cardiac Enzymes: No results for input(s): CKTOTAL, CKMB, CKMBINDEX, TROPONINI in the last 168 hours. BNP (last 3 results) No results for input(s): BNP in the last 8760 hours.  ProBNP (last 3 results) No results for input(s): PROBNP in the last 8760 hours.  CBG:  Recent Labs Lab 04/05/15 0734 04/06/15 0745 04/07/15 0720 04/08/15 0741  GLUCAP 103* 108* 138* 99    Micro Recent Results (from the past 240 hour(s))  Culture, blood (x 2)     Status: None (Preliminary result)   Collection Time: 04/04/15  1:30 AM  Result Value Ref Range Status   Specimen Description BLOOD RIGHT [facility]  Final   Special Requests IN PEDIATRIC BOTTLE [place]  Final   Culture   Final    NO GROWTH 2 DAYS Performed at [facility]    Report Status PENDING  Incomplete  Culture, blood (x 2)     Status: None (Preliminary result)   Collection Time: 04/04/15  1:44 AM  Result Value Ref Range Status   Specimen Description BLOOD RIGHT HAND  Final   Special Requests BOTTLES DRAWN AEROBIC ONLY 5CC  Final   Culture   Final    NO GROWTH 2 DAYS Performed at [facility]    Report Status PENDING  Incomplete  Urine culture     Status: None    Collection Time: 04/04/15  8:42 PM  Result Value Ref Range Status   Specimen Description URINE, CLEAN CATCH  Final   Special Requests NONE  Final   Culture   Final    3,000 COLONIES/mL INSIGNIFICANT GROWTH Performed at [facility]    Report Status 04/06/2015 FINAL  Final  Culture, sputum-assessment     Status: None   Collection Time: 04/05/15  6:31 AM  Result Value Ref Range Status   Specimen Description SPUTUM  Final   Special Requests NONE  Final   Sputum evaluation   Final    MICROSCOPIC FINDINGS SUGGEST THAT THIS SPECIMEN IS NOT REPRESENTATIVE OF LOWER RESPIRATORY SECRETIONS. PLEASE RECOLLECT. NOTIFIED [name] AT [id number] ON 12.9.16 BY SHUEA    Report Status 04/05/2015 FINAL  Final  Culture, expectorated sputum-assessment  Status: None   Collection Time: 04/05/15  5:04 PM  Result Value Ref Range Status   Specimen Description SPUTUM  Final   Special Requests Immunocompromised  Final   Sputum evaluation   Final    THIS SPECIMEN IS ACCEPTABLE. RESPIRATORY CULTURE REPORT TO FOLLOW.   Report Status 04/05/2015 FINAL  Final  Culture, respiratory (NON-Expectorated)     Status: None   Collection Time: 04/05/15  5:04 PM  Result Value Ref Range Status   Specimen Description SPUTUM  Final   Special Requests NONE  Final   Gram Stain   Final    NO WBC SEEN FEW SQUAMOUS EPITHELIAL CELLS PRESENT FEW GRAM NEGATIVE RODS Performed at [facility]    Culture   Final    NORMAL OROPHARYNGEAL FLORA Performed at [facility]    Report Status 04/08/2015 FINAL  Final     Studies: No results found.  Scheduled Meds: . allopurinol  100 mg Oral BID  . apixaban  5 mg Oral BID  . aspirin EC  81 mg Oral q morning - 10a  . diltiazem  180 mg Oral Daily  . feeding supplement (ENSURE ENLIVE)  237 mL Oral BID BM  . folic acid  1 mg Oral Daily  . metoprolol tartrate  25 mg Oral BID  . multivitamin with minerals  1 tablet Oral Daily  . pantoprazole (PROTONIX) IV  40 mg  Intravenous Daily  . piperacillin-tazobactam (ZOSYN)  IV  3.375 g Intravenous 3 times per day  . polyethylene glycol  17 g Oral Daily  . rosuvastatin  10 mg Oral q1800  . thiamine  100 mg Oral Daily  . vancomycin  750 mg Intravenous Q12H   Continuous Infusions:       Time spent: 35 minutes    [facility] A  [facility] Pager [phone] If 7PM-7AM, please contact night-coverage at [email or web address], password [facility] 04/08/2015, 12:10 PM  LOS: 4 days

## 2015-04-08 NOTE — Evaluation (Signed)
Physical Therapy Evaluation Patient Details Name: Jim Beck MRN: [id number] DOB: 1933/02/07 Today's Date: 04/08/2015   History of Present Illness  Jim Beck is a 81 y.o. male with a history of Hodgkins Lymphoma HTN COPD HLD GERD presented to the hospital 04/04/15  for weakness, dx with HCAP  Clinical Impression  Pt admitted with above diagnosis. Pt currently with functional limitations due to the deficits listed below (see PT Problem List).  Pt will benefit from skilled PT to increase their independence and safety with mobility to allow discharge to the venue listed below.       Follow Up Recommendations SNF;Supervision/Assistance - 24 hour    Equipment Recommendations  None recommended by PT    Recommendations for Other Services       Precautions / Restrictions Precautions Precautions: Other (comment) Precaution Comments: O2, monitor sats and HR      Mobility  Bed Mobility Overal bed mobility: Needs Assistance Bed Mobility: Supine to Sit     Supine to sit: Supervision     General bed mobility comments: for safety  Transfers Overall transfer level: Needs assistance Equipment used: 1 person hand held assist;None Transfers: Sit to/from [facility] Sit to Stand: Min assist;Mod assist Stand pivot transfers: Min assist       General transfer comment: 2 attempts to stand with LOB on first attempt; pt requires assist to rise and cues for safety  Ambulation/Gait Ambulation/Gait assistance: Mod assist Ambulation Distance (Feet): 40 Feet Assistive device:  (IV pole) Gait Pattern/deviations: Drifts right/left;Trunk flexed;Narrow base of support;Decreased stride length;Scissoring     General Gait Details: pt requires assist to min to mod assist due to frequent multi-directional LOB  during gait, he has incr bil knee flexion throughout gait cycle; HR elevated during amb to 139, sats to 77% on 4L; HR returned to 110 with seated rest and unable to get O2  sat reading  Stairs            Wheelchair Mobility    Modified Rankin (Stroke Patients Only)       Balance Overall balance assessment: Needs assistance;History of Falls (reports 2 recent falls) Sitting-balance support: Feet supported;Single extremity supported;No upper extremity supported Sitting balance-Leahy Scale: Good Sitting balance - Comments: pt able to perform hygiene on BSO with supervision, shifts wt on EOB in all directions without LOB     Standing balance-Leahy Scale: Fair Standing balance comment: briefly able to maintain static stand  without UE support with flexed trunk and knees, close supervision, he is unable to tol challenges                             Pertinent Vitals/Pain Pain Assessment: No/denies pain    Home Living Family/patient expects to be discharged to:: Private residence Living Arrangements: Spouse/significant other   Type of Home: Apartment Home Access: Level entry     Home Layout: One level Home Equipment: Cane - single point;Walker - 2 wheels      Prior Function Level of Independence: Independent               Hand Dominance        Extremity/Trunk Assessment   Upper Extremity Assessment: Defer to OT evaluation           Lower Extremity Assessment: Generalized weakness         Communication   Communication: No difficulties  Cognition Arousal/Alertness: Awake/alert Behavior During Therapy: WFL for tasks assessed/performed Overall  Cognitive Status: Within Functional Limits for tasks assessed                      General Comments      Exercises General Exercises - Lower Extremity Ankle Circles/Pumps: AROM;Both;10 reps Quad Sets: 5 reps;AROM;Strengthening;Both      Assessment/Plan    PT Assessment Patient needs continued PT services  PT Diagnosis Difficulty walking;Generalized weakness   PT Problem List Decreased strength;Decreased activity tolerance;Decreased mobility;Decreased  balance  PT Treatment Interventions DME instruction;Gait training;Therapeutic activities;Therapeutic exercise;Functional mobility training;Patient/family education   PT Goals (Current goals can be found in the Care Plan section) Acute Rehab PT Goals Patient Stated Goal: wants to go home PT Goal Formulation: With patient Time For Goal Achievement: 04/15/15 Potential to Achieve Goals: Fair    Frequency Min 3X/week   Barriers to discharge        Co-evaluation               End of Session Equipment Utilized During Treatment: Gait belt Activity Tolerance: Patient limited by fatigue;Treatment limited secondary to medical complications (Comment) Patient left: in chair;with call bell/phone within reach;with chair alarm set Nurse Communication: Mobility status         Time: [id number] PT Time Calculation (min) (ACUTE ONLY): 32 min   Charges:   PT Evaluation $Initial PT Evaluation Tier I: 1 Procedure PT Treatments $Gait Training: 8-22 mins   PT G Codes:        Jim Beck 04/20/15, 9:58 AM

## 2015-04-08 NOTE — Evaluation (Signed)
Occupational Therapy Evaluation Patient Details Name: Jim Beck MRN: [id number] DOB: [birth year] Today's Date: 04/08/2015    History of Present Illness Jim Beck is a 81 y.o. male with a history of Hodgkins Lymphoma HTN COPD HLD GERD presented to the hospital 04/04/15  for weakness, dx with HCAP   Clinical Impression   Pt admitted with weakness. Pt currently with functional limitations due to the deficits listed below (see OT Problem List).  Pt will benefit from skilled OT to increase their safety and independence with ADL and functional mobility for ADL to facilitate discharge to venue listed below.      Follow Up Recommendations  SNF    Equipment Recommendations  None recommended by OT    Recommendations for Other Services       Precautions / Restrictions Precautions Precautions: Other (comment) Precaution Comments: O2, monitor sats and HR Restrictions Weight Bearing Restrictions: No      Mobility Bed Mobility Overal bed mobility: Needs Assistance Bed Mobility: Supine to Sit     Supine to sit: Supervision     General bed mobility comments: pt in chair  Transfers Overall transfer level: Needs assistance Equipment used: Rolling walker (2 wheeled) Transfers: Sit to/from Stand Sit to Stand: Mod assist Stand pivot transfers: Min assist       General transfer comment: VC for sequencing and safety    Balance Overall balance assessment: Needs assistance;History of Falls (reports 2 recent falls) Sitting-balance support: Feet supported;Single extremity supported;No upper extremity supported Sitting balance-Leahy Scale: Good Sitting balance - Comments: pt able to perform hygiene on BSO with supervision, shifts wt on EOB in all directions without LOB     Standing balance-Leahy Scale: Fair Standing balance comment: briefly able to maintain static stand  without UE support with flexed trunk and knees, close supervision, he is unable to tol challenges                             ADL   Eating/Feeding: Set up;Sitting   Grooming: Set up;Wash/dry face;Sitting   Upper Body Bathing: Minimal assitance   Lower Body Bathing: Moderate assistance;Sit to/from stand   Upper Body Dressing : Minimal assistance;Sitting   Lower Body Dressing: Moderate assistance;Sit to/from stand                                 Pertinent Vitals/Pain Pain Assessment: No/denies pain        Extremity/Trunk Assessment Upper Extremity Assessment Upper Extremity Assessment: Generalized weakness   Lower Extremity Assessment Lower Extremity Assessment: Generalized weakness       Communication Communication Communication: No difficulties   Cognition Arousal/Alertness: Awake/alert Behavior During Therapy: WFL for tasks assessed/performed Overall Cognitive Status: Within Functional Limits for tasks assessed                                Home Living Family/patient expects to be discharged to:: Private residence Living Arrangements: Spouse/significant other   Type of Home: Apartment Home Access: Level entry     Home Layout: One level     Bathroom Shower/Tub: Occupational psychologist: Standard     Home Equipment: Cane - single point;Walker - 2 wheels;Shower seat          Prior Functioning/Environment Level of Independence: Independent  OT Diagnosis: Generalized weakness   OT Problem List: Decreased strength;Decreased activity tolerance   OT Treatment/Interventions: Self-care/ADL training;DME and/or AE instruction    OT Goals(Current goals can be found in the care plan section) Acute Rehab OT Goals Patient Stated Goal: wants to go home OT Goal Formulation: With patient Time For Goal Achievement: 05/13/2015 Potential to Achieve Goals: Good  OT Frequency: Min 2X/week              End of Session Nurse Communication: Mobility status  Activity Tolerance: Patient limited by  fatigue Patient left: in chair   Time: 1055-1110 OT Time Calculation (min): 15 min Charges:  OT General Charges $OT Visit: 1 Procedure OT Evaluation $Initial OT Evaluation Tier I: 1 Procedure G-Codes:    Betsy Pries 04/29/2015, 11:46 AM

## 2015-04-09 DIAGNOSIS — I481 Persistent atrial fibrillation: Secondary | ICD-10-CM

## 2015-04-09 LAB — BASIC METABOLIC PANEL
Anion gap: 7 (ref 5–15)
BUN: 27 mg/dL — ABNORMAL HIGH (ref 6–20)
CALCIUM: 8.9 mg/dL (ref 8.9–10.3)
CO2: 27 mmol/L (ref 22–32)
CREATININE: 1.36 mg/dL — AB (ref 0.61–1.24)
Chloride: 103 mmol/L (ref 101–111)
GFR calc non Af Amer: 48 mL/min — ABNORMAL LOW (ref >60)
GFR, EST AFRICAN AMERICAN: 55 mL/min — AB (ref >60)
GLUCOSE: 108 mg/dL — AB (ref 65–99)
Potassium: 3.9 mmol/L (ref 3.5–5.1)
Sodium: 137 mmol/L (ref 135–145)

## 2015-04-09 LAB — GLUCOSE, CAPILLARY
Glucose-Capillary: 88 mg/dL (ref 65–99)
Glucose-Capillary: 158 mg/dL — ABNORMAL HIGH (ref 65–99)
Glucose-Capillary: 134 mg/dL — ABNORMAL HIGH (ref 65–99)

## 2015-04-09 LAB — CBC
HCT: 28.3 % — ABNORMAL LOW (ref 39.0–52.0)
Hemoglobin: 8.6 g/dL — ABNORMAL LOW (ref 13.0–17.0)
MCH: 24.1 pg — AB (ref 26.0–34.0)
MCHC: 30.4 g/dL (ref 30.0–36.0)
MCV: 79.3 fL (ref 78.0–100.0)
PLATELETS: 299 10*3/uL (ref 150–400)
RBC: 3.57 MIL/uL — ABNORMAL LOW (ref 4.22–5.81)
RDW: 23.3 % — ABNORMAL HIGH (ref 11.5–15.5)
WBC: 3.3 10*3/uL — ABNORMAL LOW (ref 4.0–10.5)

## 2015-04-09 MED ORDER — FUROSEMIDE 10 MG/ML IJ SOLN
40.0000 mg | Freq: Once | INTRAMUSCULAR | Status: AC
  Administered 2015-04-09: 40 mg via INTRAVENOUS
  Filled 2015-04-09: qty 4

## 2015-04-09 MED ORDER — APIXABAN 5 MG PO TABS: 5.0000 mg | ORAL_TABLET | Freq: Two times a day (BID) | ORAL | Status: DC

## 2015-04-09 MED ORDER — LEVOFLOXACIN 750 MG PO TABS: 750.0000 mg | ORAL_TABLET | Freq: Every day | ORAL | Status: DC

## 2015-04-09 MED ORDER — DILTIAZEM HCL ER COATED BEADS 180 MG PO CP24: 180.0000 mg | ORAL_CAPSULE | Freq: Every day | ORAL | Status: AC

## 2015-04-09 MED ORDER — METOPROLOL TARTRATE 25 MG PO TABS: 25.0000 mg | ORAL_TABLET | Freq: Two times a day (BID) | ORAL | Status: AC

## 2015-04-09 MED ORDER — LEVOFLOXACIN 750 MG PO TABS: 750.0000 mg | ORAL_TABLET | ORAL | Status: DC

## 2015-04-09 MED ORDER — BOOST / RESOURCE BREEZE PO LIQD
1.0000 | Freq: Two times a day (BID) | ORAL | Status: DC
  Administered 2015-04-09 – 2015-04-10 (×3): 1 via ORAL

## 2015-04-09 NOTE — Discharge Summary (Addendum)
Physician Discharge Summary  Jim Beck [id number] DOB: 12/04/1932 DOA: 04/04/2015  PCP: Kandice Hams, MD  Admit date: 04/04/2015 Discharge date: 04/10/2015  Time spent: 40 minutes  Recommendations for Outpatient Follow-up:  1. Follow-up with primary care physician within one week. 2. Ceftin 500mg  BID for 3 more days. 3. Follow CBC and BMP within 1 week.  Discharge Diagnoses:  Principal Problem:   HCAP (healthcare-associated pneumonia) Active Problems:   Hodgkin lymphoma ([place])   Weakness   Anemia   COPD (chronic obstructive pulmonary disease) (HCC)   HTN (hypertension)   HLD (hyperlipidemia)   Protein-calorie malnutrition, severe   Atrial fibrillation ([place])   Sepsis ([place])   Discharge Condition: Stable  Diet recommendation: Heart healthy  Filed Weights   04/05/15 0432 04/08/15 0613 04/09/15 0515  Weight: 83.7 kg (184 lb 8.4 oz) 82 kg (180 lb 12.4 oz) 84.4 kg (186 lb 1.1 oz)    History of present illness:  Jim Beck is a 81 y.o. male with a history of Hodgkins Lymphoma HTN COPD HLD GERD presented to the hospital for weakness. Patient has a history of HL diagnosed in 1979 with a recurrence on 2011. Patient has been receiving chemotherapy and has been getting progressively weak. He was last seen on 11/30 in the cancer center and at that time he had been feeling fine. He received day 15 of cycle #3 and the plan was that he would come back in a month for a follow up PET scan. Patient was brought in today because of weakness. He is reported to not being able to get up and is not able to ambulate. His symptoms have been getting worse over the last week. Patient was also noted to have atrial fibrillation and has been started on heparin for this as this appears to be new onset. In addition the patient did have a CT scan which shows presence of LUL area infiltrates which could represent pneumonia vs atypical CHF. Since he has been on chemo and in and out of the hospital he will be  covered for potential HCAP. He has been having cough and congestion. Also has felt tightness and pain in the lower central chest  Hospital Course:  HCAP Presented with shortness of breath, hypoxia, CT scan showed infiltrates in the LUL. Started on Zosyn and vancomycin, blood and sputum cultures sent and has remained no growth Urine Legionella and Streptococcus antigen neg Continue supportive management with bronchodilators, mucolytics, antitussives and oxygen as needed. Discharge on Ceftin for 3 more days to complete a total of 10 of antibiotics (Patient received 7 days of IV vanc and zosyn)  Acute on chronic respiratory failure with hypoxia Patient is on 2 L of oxygen at home continuously (reported he was placed recently on that). Currently needs 4 L of oxygen to keep his oxygen saturation in the mid 90s. Hypoxemia is likely secondary to the healthcare associated pneumonia.  Atrial fibrillation With rapid ventricular response, heart rate is in the 130s. Responded to boluses of IV metoprolol, initially started on IV heparin, switched to Eliquis. 2-D echo showed ejection fraction of 55-60% Rate is controlled with metoprolol and Cardizem. This patients CHA2DS2-VASc Score and unadjusted Ischemic Stroke Rate (% per year) is equal to 4.8 % stroke rate/year from a score of 4 for HTN, MI and 2 points for age >17.  Acute kidney injury -Creatinine is slightly elevated at 1.35 on admission, went up to peak of 1.5. -Creatinine baseline is 1.0 from 03/13/2015 -follow BMP as an outpatient  Anemia -Presented with hemoglobin of 7.0, Secondary to recent chemotherapy, received 2 units of packed RBCs. -hemoglobin is 8.9 this morning. -Anemia is likely secondary to his Hodgkin lymphoma and effects of chemotherapy  Generalized weakness -will discharge to SNF for PT rehab  Hodgkins Lymphoma -per oncology  COPD -duonebs as needed  HTN -Blood pressure stable he is on metoprolol and Cardizem CD    Gout -will continue with allopurinol  Procedures:  None  Consultations:  None  Discharge Exam: Filed Vitals:   04/09/15 0515 04/09/15 1353  BP: 92/66 90/60  Pulse: 76 90  Temp: 98.4 F (36.9 C) 98.7 F (37.1 C)  Resp: 22 20   General: Alert and awake, oriented x3, not in any acute distress. HEENT: anicteric sclera, pupils reactive to light and accommodation, EOMI CVS: S1-S2 clear, no murmur rubs or gallops Chest: clear to auscultation bilaterally, no wheezing, rales or rhonchi Abdomen: soft nontender, nondistended, normal bowel sounds, no organomegaly Extremities: no cyanosis, clubbing or edema noted bilaterally Neuro: Cranial nerves II-XII intact, no focal neurological deficits  Discharge Instructions    Current Discharge Medication List    START taking these medications   Details  apixaban (ELIQUIS) 2.5 MG TABS tablet Take 1 tablet (2.5 mg total) by mouth 2 (two) times daily.    cefUROXime (CEFTIN) 500 MG tablet Take 1 tablet (500 mg total) by mouth 2 (two) times daily with a meal. For 3 more days    diltiazem (CARDIZEM CD) 180 MG 24 hr capsule Take 1 capsule (180 mg total) by mouth daily.    metoprolol tartrate (LOPRESSOR) 25 MG tablet Take 1 tablet (25 mg total) by mouth 2 (two) times daily.      CONTINUE these medications which have NOT CHANGED   Details  allopurinol (ZYLOPRIM) 100 MG tablet Take 1 tablet (100 mg total) by mouth 2 (two) times daily. Qty: 60 tablet, Refills: 2    lidocaine-prilocaine (EMLA) cream Apply 1 application topically as needed. Qty: 30 g, Refills: 1    OXYGEN Inhale 2 L into the lungs continuous.    rosuvastatin (CRESTOR) 10 MG tablet Take 10 mg by mouth every morning.       STOP taking these medications     aspirin EC 81 MG tablet      HYDROcodone-acetaminophen (NORCO) 5-325 MG per tablet      prochlorperazine (COMPAZINE) 10 MG tablet        Allergies  Allergen Reactions  . Aspirin     325mg  gives him shakes  but low dose 81mg  is fine   Follow-up Information    Follow up with Kandice Hams, MD In 1 week.   Specialty:  Internal Medicine   Contact information:   [address] [place] [address] [phone]        The results of significant diagnostics from this hospitalization (including imaging, microbiology, ancillary and laboratory) are listed below for reference.    Significant Diagnostic Studies: Dg Chest 2 View  04/04/2015  CLINICAL DATA:  81 year old male with chest pain and cough for 8 months. Hodgkin's lymphoma. Hypertension. EXAM: CHEST  2 VIEW COMPARISON:  02/17/2012 chest x-ray.  11/20/2014 PET CT. FINDINGS: Right central line tip distal superior vena cava without pneumothorax. Large hiatal hernia. Mild cardiomegaly. Asymmetric airspace disease greater on the left has progressed since prior exam. Pulmonary vascular congestion may partially contribute to this finding however patchy infiltrate or tumor within left lung may be present. IMPRESSION: Asymmetric airspace disease greater on the  left has progressed since prior exam. Pulmonary vascular congestion may partially contribute to this finding however patchy infiltrate or tumor within left lung may be present. Large hiatal hernia. Electronically Signed   By: Genia Del M.D.   On: 04/04/2015 18:13   Ct Angio Chest Pe W/cm &/or Wo Cm  04/04/2015  CLINICAL DATA:  Weakness, chest pain and cough for 8 months. Lymphoma. EXAM: CT ANGIOGRAPHY CHEST WITH CONTRAST TECHNIQUE: Multidetector CT imaging of the chest was performed using the standard protocol during bolus administration of intravenous contrast. Multiplanar CT image reconstructions and MIPs were obtained to evaluate the vascular anatomy. CONTRAST:  154mL OMNIPAQUE IOHEXOL 350 MG/ML SOLN COMPARISON:  Radiographs 04/04/2015. CT 08/27/2014. PET-CT 11/20/2014. FINDINGS: Mediastinum: The pulmonary arteries are well opacified with contrast. There is no evidence of acute  pulmonary embolism. There is central enlargement of the pulmonary arteries and right cardiac chamber dilatation consistent with pulmonary arterial hypertension. There is mild aortic atherosclerosis.Port-A-Cath tip in the right atrium. Previously demonstrated hypermetabolic left axillary adenopathy has improved with the largest remaining node measuring 8 mm short axis on image 25. There are scattered small mediastinal and hilar lymph nodes which are similar to prior CT, not significantly hypermetabolic on PET-CT. There is a moderate to large hiatal hernia. Lungs/Pleura: Small dependent right pleural effusion.Severe emphysema with interval superimposed patchy ground-glass opacities throughout both lungs as demonstrated on today's radiographs. There is relative sparing within the right lower lobe. Components in the left upper lobe are slightly more confluent, suspicious for pneumonia. No well-defined mass or suspicious nodule. Upper abdomen: No significant findings visualized. There is reflux of contrast into the IVC and hepatic veins consistent with right heart failure. Musculoskeletal/Chest wall: No chest wall lesion or acute osseous findings. Review of the MIP images confirms the above findings. IMPRESSION: 1. No evidence of acute pulmonary embolism. 2. Right cardiac chamber enlargement and central enlargement of the pulmonary arteries consistent with pulmonary arterial hypertension. 3. Severe emphysema with superimposed patchy ground-glass opacities in both lungs. Findings could be secondary to asymmetric edema, although there is a more confluent component in the left upper lobe suspicious for pneumonia. Small right pleural effusion. 4. Improvement in previously demonstrated hypermetabolic left axillary adenopathy consistent with treated lymphoma. 5. Moderate to large hiatal hernia. Electronically Signed   By: Richardean Sale M.D.   On: 04/04/2015 20:10    Microbiology: Recent Results (from the past 240  hour(s))  Culture, blood (x 2)     Status: None (Preliminary result)   Collection Time: 04/04/15  1:30 AM  Result Value Ref Range Status   Specimen Description BLOOD RIGHT [facility]  Final   Special Requests IN PEDIATRIC BOTTLE [place]  Final   Culture   Final    NO GROWTH 4 DAYS Performed at [facility]    Report Status PENDING  Incomplete  Culture, blood (x 2)     Status: None (Preliminary result)   Collection Time: 04/04/15  1:44 AM  Result Value Ref Range Status   Specimen Description BLOOD RIGHT HAND  Final   Special Requests BOTTLES DRAWN AEROBIC ONLY 5CC  Final   Culture   Final    NO GROWTH 4 DAYS Performed at [facility]    Report Status PENDING  Incomplete  Urine culture     Status: None   Collection Time: 04/04/15  8:42 PM  Result Value Ref Range Status   Specimen Description URINE, CLEAN CATCH  Final   Special Requests NONE  Final  Culture   Final    3,000 COLONIES/mL INSIGNIFICANT GROWTH Performed at [facility]    Report Status 04/06/2015 FINAL  Final  Culture, sputum-assessment     Status: None   Collection Time: 04/05/15  6:31 AM  Result Value Ref Range Status   Specimen Description SPUTUM  Final   Special Requests NONE  Final   Sputum evaluation   Final    MICROSCOPIC FINDINGS SUGGEST THAT THIS SPECIMEN IS NOT REPRESENTATIVE OF LOWER RESPIRATORY SECRETIONS. PLEASE RECOLLECT. NOTIFIED [name] AT [id number] ON 12.9.16 BY SHUEA    Report Status 04/05/2015 FINAL  Final  Culture, expectorated sputum-assessment     Status: None   Collection Time: 04/05/15  5:04 PM  Result Value Ref Range Status   Specimen Description SPUTUM  Final   Special Requests Immunocompromised  Final   Sputum evaluation   Final    THIS SPECIMEN IS ACCEPTABLE. RESPIRATORY CULTURE REPORT TO FOLLOW.   Report Status 04/05/2015 FINAL  Final  Culture, respiratory (NON-Expectorated)     Status: None   Collection Time: 04/05/15  5:04 PM  Result Value Ref Range Status   Specimen  Description SPUTUM  Final   Special Requests NONE  Final   Gram Stain   Final    NO WBC SEEN FEW SQUAMOUS EPITHELIAL CELLS PRESENT FEW GRAM NEGATIVE RODS Performed at [facility]    Culture   Final    NORMAL OROPHARYNGEAL FLORA Performed at [facility]    Report Status 04/08/2015 FINAL  Final     Labs: Basic Metabolic Panel:  Recent Labs Lab 04/04/15 1745 04/04/15 1754 04/06/15 0830 04/07/15 0500 04/08/15 0420 04/09/15 0504  NA 139 140 136 134* 133* 137  K 4.3 4.2 3.7 3.9 4.2 3.9  CL 107 106 103 102 100* 103  CO2 25  --  25 27 28 27   GLUCOSE 131* 127* 128* 105* 128* 108*  BUN 27* 28* 20 26* 26* 27*  CREATININE 1.51* 1.20 1.30* 1.31* 1.31* 1.36*  CALCIUM 8.6*  --  8.5* 8.5* 8.3* 8.9   Liver Function Tests:  Recent Labs Lab 04/04/15 0200 04/04/15 1745  AST 15 16  ALT 14* 15*  ALKPHOS 49 48  BILITOT 0.4 0.5  PROT 6.0* 5.9*  ALBUMIN 2.7* 2.5*   CBC:  Recent Labs Lab 04/04/15 0200 04/04/15 1745  04/05/15 0810 04/06/15 0830 04/07/15 0500 04/08/15 0420 04/09/15 0504  WBC 5.1 4.7  --  4.2 4.0 3.3* 3.2* 3.3*  NEUTROABS 3.7 3.6  --   --   --   --   --   --   HGB 7.4* 7.4*  < > 7.0* 8.9* 8.3* 8.4* 8.6*  HCT 24.4* 24.4*  < > 23.4* 29.1* 27.4* 27.6* 28.3*  MCV 78.0 77.7*  --  78.3 79.3 78.5 79.1 79.3  PLT 250 263  --  247 233 249 264 299  < > = values in this interval not displayed. Cardiac Enzymes: No results for input(s): CKTOTAL, CKMB, CKMBINDEX, TROPONINI in the last 168 hours. BNP: BNP (last 3 results) No results for input(s): BNP in the last 8760 hours.  ProBNP (last 3 results) No results for input(s): PROBNP in the last 8760 hours.  CBG:  Recent Labs Lab 04/06/15 0745 04/07/15 0720 04/08/15 0741 04/09/15 0751 04/09/15 1148  GLUCAP 108* 138* 99 88 158*       Signed:  ELMAHI,MUTAZ A  [facility] 04/09/2015, 3:36 PM

## 2015-04-09 NOTE — Progress Notes (Signed)
Nutrition Follow-up  DOCUMENTATION CODES:   Severe malnutrition in context of acute illness/injury  INTERVENTION:  - Will d/c Ensure Enlive per pt request - Will order Boost Breeze BID, each supplement provides 250 kcal and 9 grams of protein - RD will continue to monitor for needs  NUTRITION DIAGNOSIS:   Increased nutrient needs related to catabolic illness, cancer and cancer related treatments as evidenced by estimated needs. -ongoing  GOAL:   Patient will meet greater than or equal to 90% of their needs -met on average  MONITOR:   PO intake, Supplement acceptance, Weight trends, Labs, I & O's  ASSESSMENT:   81 y.o. male with a history of Hodgkins Lymphoma HTN COPD HLD GERD presented to the hospital for weakness. Patient has a history of HL diagnosed in 1979 with a recurrence on 2011. Patient has been receiving chemotherapy and has been getting progressively weak. He was last seen on 11/30 in the cancer center and at that time he had been feeling fine. He received day 15 of cycle #3 and the plan was that he would come back in a month for a follow up PET scan. Patient was brought in today because of weakness. He is reported to not being able to get up and is not able to ambulate. His symptoms have been getting worse over the last week. Patient was also noted to have atrial fibrillation and has been started on heparin for this as this appears to be new onset. In addition the patient did have a CT scan which shows presence of LUL area infiltrates which could represent pneumonia vs atypical CHF. Since he has been on chemo and in and out of the hospital he will be covered for potential HCAP. He has been having cough and congestion. Also has felt tightness and pain in the lower central chest  12/13 Per chart review, pt ate 100% of all meals 12/10, 50% of breakfast and 100% of dinner 12/11, and 100% of lunch and dinner yesterday (12/12). Pt reports he had a few bites of oatmeal, egg, a  biscuit, and drank coffee for breakfast this AM. He denies abdominal pain with any intakes but states that he continues to have his usual nausea with all intakes. He does not feel that nausea has been any worse than usual recently.   Pt states that he has been drinking Ensure intermittently but that it causes him to have diarrhea and he does not want to receive it anymore. He is willing to try Boost Breeze.   Likely meeting needs on average. Medications reviewed. Labs reviewed; CBGs: 88-138 mg/dL, BUN/creatinine elevated and trending up, GFR: 55.    12/9 - Pt states he had a few bites of fruit and drank a few sips of juice for lunch today.  - He states he felt nauseated, abdominal cramping, and indigestion following minimal intake.  - Pt reports that these symptoms have been ongoing x4-5 years and occur with nearly all PO intakes.  - He has upper dentures in place and denies chewing difficulties.  - He states that he has been having swallowing difficulty for several years and needs to take extra time to swallow as he feels items are getting stuck in his throat.  - Last chemo: 11/30. Pt states taste alteration in the form of metallic, dirt taste to foods while undergoing chemo.  - He has recently began drinking nutrition supplements such as Ensure and Boost and is interested in receiving them during hospitalization. - Pt reports UBW  of 180 lbs and states that his weight has been fluctuating recently; confirmed by chart review.  - He lost 8 lbs (4% body weight) in 14 days in November; significant for time frame.  - Chart shows that since 11/30 pt has regained this weight.    Diet Order:  Diet heart healthy/carb modified Room service appropriate?: Yes; Fluid consistency:: Thin  Skin:  Reviewed, no issues  Last BM:  12/12  Height:   Ht Readings from Last 1 Encounters:  04/08/15 5' 11"  (1.803 m)    Weight:   Wt Readings from Last 1 Encounters:  04/09/15 186 lb 1.1 oz (84.4 kg)     Ideal Body Weight:  78.18 kg (kg)  BMI:  Body mass index is 25.96 kg/(m^2).  Estimated Nutritional Needs:   Kcal:  2350-2500  Protein:  85-100 grams  Fluid:  2-2.2 L/day  EDUCATION NEEDS:   No education needs identified at this time     Jarome Matin, RD, LDN Inpatient Clinical Dietitian Pager # [phone] After hours/weekend pager # [phone]

## 2015-04-10 ENCOUNTER — Ambulatory Visit: Payer: Medicare Other | Admitting: Physician Assistant

## 2015-04-10 ENCOUNTER — Ambulatory Visit: Payer: Medicare Other

## 2015-04-10 ENCOUNTER — Other Ambulatory Visit: Payer: Medicare Other

## 2015-04-10 LAB — BASIC METABOLIC PANEL
ANION GAP: 6 (ref 5–15)
BUN: 27 mg/dL — ABNORMAL HIGH (ref 6–20)
CALCIUM: 8.6 mg/dL — AB (ref 8.9–10.3)
CO2: 29 mmol/L (ref 22–32)
Chloride: 99 mmol/L — ABNORMAL LOW (ref 101–111)
Creatinine, Ser: 1.51 mg/dL — ABNORMAL HIGH (ref 0.61–1.24)
GFR, EST AFRICAN AMERICAN: 49 mL/min — AB (ref >60)
GFR, EST NON AFRICAN AMERICAN: 42 mL/min — AB (ref >60)
GLUCOSE: 119 mg/dL — AB (ref 65–99)
Potassium: 3.6 mmol/L (ref 3.5–5.1)
SODIUM: 134 mmol/L — AB (ref 135–145)

## 2015-04-10 LAB — CBC
HCT: 28.6 % — ABNORMAL LOW (ref 39.0–52.0)
HEMOGLOBIN: 8.5 g/dL — AB (ref 13.0–17.0)
MCH: 23.5 pg — ABNORMAL LOW (ref 26.0–34.0)
MCHC: 29.7 g/dL — AB (ref 30.0–36.0)
MCV: 79 fL (ref 78.0–100.0)
Platelets: 369 10*3/uL (ref 150–400)
RBC: 3.62 MIL/uL — AB (ref 4.22–5.81)
RDW: 23.6 % — ABNORMAL HIGH (ref 11.5–15.5)
WBC: 3.5 10*3/uL — ABNORMAL LOW (ref 4.0–10.5)

## 2015-04-10 LAB — CULTURE, BLOOD (ROUTINE X 2)
CULTURE: NO GROWTH
CULTURE: NO GROWTH

## 2015-04-10 LAB — GLUCOSE, CAPILLARY: GLUCOSE-CAPILLARY: 103 mg/dL — AB (ref 65–99)

## 2015-04-10 MED ORDER — APIXABAN 2.5 MG PO TABS: 2.5000 mg | ORAL_TABLET | Freq: Two times a day (BID) | ORAL | Status: AC

## 2015-04-10 MED ORDER — HEPARIN SOD (PORK) LOCK FLUSH 100 UNIT/ML IV SOLN
500.0000 [IU] | INTRAVENOUS | Status: AC | PRN
  Administered 2015-04-10: 500 [IU]

## 2015-04-10 MED ORDER — APIXABAN 2.5 MG PO TABS
2.5000 mg | ORAL_TABLET | Freq: Two times a day (BID) | ORAL | Status: DC
  Filled 2015-04-10: qty 1

## 2015-04-10 MED ORDER — CEFUROXIME AXETIL 500 MG PO TABS: 500.0000 mg | ORAL_TABLET | Freq: Two times a day (BID) | ORAL | Status: AC

## 2015-04-10 NOTE — Progress Notes (Signed)
Pt VSS. IV team RN [facility] a cath. PTAR. Report called to [facility], no answer.

## 2015-04-10 NOTE — NC FL2 (Signed)
[place] MEDICAID FL2 LEVEL OF CARE SCREENING TOOL     IDENTIFICATION  Patient Name: Jim Beck Birthdate: 01-Jul-1932 Sex: male Admission Date (Current Location): 04/04/2015  [facility] and [place] Number: Herbalist and Address:  [facility],  [place] [address], [place]      Provider Number: [id number]  Attending Physician Name and Address:  Barton Dubois, MD  Relative Name and Phone Number:       Current Level of Care: Hospital Recommended Level of Care: [place] Prior Approval Number:    Date Approved/Denied:   PASRR Number: [id number]  Discharge Plan: SNF    Current Diagnoses: Patient Active Problem List   Diagnosis Date Noted  . Atrial fibrillation ([place])   . Sepsis ([place])   . Protein-calorie malnutrition, severe 04/05/2015  . Weakness 04/04/2015  . Anemia 04/04/2015  . HCAP (healthcare-associated pneumonia) 04/04/2015  . COPD (chronic obstructive pulmonary disease) ([place]) 04/04/2015  . HTN (hypertension) 04/04/2015  . HLD (hyperlipidemia) 04/04/2015  . 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 Constipation 01/30/2015  . Neutropenia ([place]) 01/17/2015  . Encounter for antineoplastic chemotherapy 12/19/2014  . Hodgkin lymphoma ([place]) 11/12/2014  . NHL (non-Hodgkin's lymphoma) ([place]) 07/26/2014  . Personal history of colonic polyps - adenomas 11/30/2013  . GERD with stricture 02/05/2011    Orientation RESPIRATION BLADDER Height & Weight    Self, Time, Situation, Place    Continent   184 lbs.  BEHAVIORAL SYMPTOMS/MOOD NEUROLOGICAL BOWEL NUTRITION STATUS      Continent Diet (Heart healthy/carb modified)  AMBULATORY STATUS COMMUNICATION OF NEEDS Skin   Limited Assist Verbally                         Personal Care Assistance Level of Assistance  Bathing, Dressing Bathing Assistance: Limited assistance   Dressing Assistance: Limited assistance     Functional Limitations Info            SPECIAL CARE FACTORS FREQUENCY                       Contractures      Additional Factors Info  Code Status, Allergies Code Status Info:  (Full Code) Allergies Info:  (Aspirin)           Current Medications (04/10/2015):  This is the current hospital active medication list Current Facility-Administered Medications  Medication Dose Route Frequency Provider Last Rate Last Dose  . acetaminophen (TYLENOL) tablet 650 mg  650 mg Oral Q6H PRN Allyne Gee, MD       Or  . acetaminophen (TYLENOL) suppository 650 mg  650 mg Rectal Q6H PRN Allyne Gee, MD      . acetaminophen (TYLENOL) tablet 650 mg  650 mg Oral Q6H PRN Hewitt Shorts Harduk, PA-C   650 mg at 04/08/15 2210  . albuterol (PROVENTIL) (2.5 MG/3ML) 0.083% nebulizer solution 2.5 mg  2.5 mg Nebulization Q6H PRN Allyne Gee, MD      . allopurinol (ZYLOPRIM) tablet 100 mg  100 mg Oral BID Allyne Gee, MD   100 mg at 04/10/15 0942  . apixaban (ELIQUIS) tablet 2.5 mg  2.5 mg Oral BID Randa Spike, RPH      . aspirin EC tablet 81 mg  81 mg Oral q morning - 10a Allyne Gee, MD   81 mg at 04/10/15 [id number]  . diltiazem (CARDIZEM CD) 24 hr capsule 180 mg  180 mg Oral Daily Verlee Monte, MD   180 mg at 04/10/15 0942  . feeding supplement (BOOST / RESOURCE BREEZE) liquid 1 Container  1 Container Oral BID BM Rosezetta Schlatter, RD   1 Container at 04/10/15 1400  . folic acid (FOLVITE) tablet 1 mg  1 mg Oral Daily Allyne Gee, MD   1 mg at 04/10/15 [id number]  . gi cocktail (Maalox,Lidocaine,Donnatal)  30 mL Oral TID PRN Verlee Monte, MD   30 mL at 04/07/15 1444  . HYDROcodone-acetaminophen (NORCO/VICODIN) 5-325 MG per tablet 1-2 tablet  1-2 tablet Oral Q6H PRN Mickel Baas A Harduk, PA-C      . ipratropium (ATROVENT) nebulizer solution 0.5 mg  0.5 mg Nebulization Q6H PRN Allyne Gee, MD      . metoprolol (LOPRESSOR) injection 10 mg  10 mg Intravenous Q4H PRN Verlee Monte, MD   10 mg at 04/06/15 1318  . metoprolol tartrate (LOPRESSOR)  tablet 25 mg  25 mg Oral BID Verlee Monte, MD   25 mg at 04/10/15 0942  . multivitamin with minerals tablet 1 tablet  1 tablet Oral Daily Allyne Gee, MD   1 tablet at 04/10/15 [phone]  . ondansetron (ZOFRAN) tablet 4 mg  4 mg Oral Q6H PRN Allyne Gee, MD       Or  . ondansetron [facility]) injection 4 mg  4 mg Intravenous Q6H PRN Allyne Gee, MD   4 mg at 04/05/15 1300  . pantoprazole (PROTONIX) injection 40 mg  40 mg Intravenous Daily Verlee Monte, MD   40 mg at 04/10/15 0940  . piperacillin-tazobactam (ZOSYN) IVPB 3.375 g  3.375 g Intravenous 3 times per day Allyne Gee, MD   3.375 g at 04/10/15 1334  . polyethylene glycol (MIRALAX / GLYCOLAX) packet 17 g  17 g Oral Daily Verlee Monte, MD   17 g at 04/10/15 0940  . rosuvastatin (CRESTOR) tablet 10 mg  10 mg Oral q1800 Allyne Gee, MD   10 mg at 04/09/15 1724  . sodium chloride 0.9 % injection 10-40 mL  10-40 mL Intracatheter PRN Allyne Gee, MD   10 mL at 04/10/15 0357  . thiamine (VITAMIN B-1) tablet 100 mg  100 mg Oral Daily Allyne Gee, MD   100 mg at 04/10/15 [id number]  . vancomycin (VANCOCIN) IVPB 750 mg/150 ml premix  750 mg Intravenous Q12H Allyne Gee, MD   750 mg at 04/10/15 1154     Discharge Medications: Please see discharge summary for a list of discharge medications.  Relevant Imaging Results:  Relevant Lab Results:   Additional Information [id number]  Ludwig Clarks, LCSW

## 2015-04-10 NOTE — Progress Notes (Signed)
Physical Therapy Treatment Patient Details Name: Jim Beck MRN: [id number] DOB: 03-01-1933 Today's Date: 04/10/2015    History of Present Illness Jim Beck is a 81 y.o. male with a history of Hodgkins Lymphoma HTN COPD HLD GERD presented to the hospital 04/04/15  for weakness, dx with HCAP    PT Comments    Pt with SOB and dizziness upon sitting EOB, only able to tolerate OOB to recliner today.  Pt required 6L O2 [place] (RN notified).  Follow Up Recommendations  SNF;Supervision/Assistance - 24 hour     Equipment Recommendations  None recommended by PT    Recommendations for Other Services       Precautions / Restrictions Precautions Precautions: Fall Precaution Comments: O2, monitor sats and HR Restrictions Weight Bearing Restrictions: No    Mobility  Bed Mobility Overal bed mobility: Needs Assistance Bed Mobility: Supine to Sit     Supine to sit: Supervision     General bed mobility comments: assist for lines, required break at EOB prior to continuing with mobility, also reports dizziness  Transfers Overall transfer level: Needs assistance Equipment used: 1 person hand held assist Transfers: Sit to/from Stand Sit to Stand: Min assist Stand pivot transfers: Min assist       General transfer comment: assist to rise and steady, pt did not appear able to tolerate ambulation due to dizziness and SOB, SpO2 decreasing to 72% on 4L so increased to 6L prior to transfer to recliner (linen needed to be changed), HR 130 bpm; SpO2 89% on 6L O2 upon resting in recliner Investment banker, corporate notified)  Ambulation/Gait                 Stairs            Wheelchair Mobility    Modified Rankin (Stroke Patients Only)       Balance                                    Cognition Arousal/Alertness: Awake/alert Behavior During Therapy: WFL for tasks assessed/performed Overall Cognitive Status: Within Functional Limits for tasks assessed                       Exercises      General Comments        Pertinent Vitals/Pain Pain Assessment: 0-10 Pain Location: reports chest pain but states at his baseline (not new) Pain Descriptors / Indicators: Aching Pain Intervention(s): Limited activity within patient's tolerance;Monitored during session;Repositioned    Home Living                      Prior Function            PT Goals (current goals can now be found in the care plan section) Progress towards PT goals: Progressing toward goals    Frequency  Min 3X/week    PT Plan Current plan remains appropriate    Co-evaluation             End of Session Equipment Utilized During Treatment: Oxygen Activity Tolerance: Patient limited by fatigue;Treatment limited secondary to medical complications (Comment) Patient left: in chair;with call bell/phone within reach;with chair alarm set;with nursing/sitter in room     Time: [id number] PT Time Calculation (min) (ACUTE ONLY): 14 min  Charges:  $Therapeutic Activity: 8-22 mins  G Codes:      Wister Hoefle,KATHrine E 04/03/2015, 10:25 AM Carmelia Bake, PT, DPT 04/05/2015 Pager: [phone]

## 2015-04-10 NOTE — [place] FL2 (Deleted)
[place] MEDICAID FL2 LEVEL OF CARE SCREENING TOOL     IDENTIFICATION  Patient Name: Jim Beck Birthdate: [birth year] Sex: male Admission Date (Current Location): 04/04/2015  [facility] [place] Number: Herbalist and Address:  [facility],  [place] [address] [place]      Provider Number: [id number]  Attending Physician Name and Address:  Barton Dubois, MD  Relative Name and Phone Number:       Current Level of Care: Hospital Recommended Level of Care: [place] Prior Approval Number:    Date Approved/Denied:   PASRR Number:    Discharge Plan: SNF    Current Diagnoses: Patient Active Problem List   Diagnosis Date Noted  . Atrial fibrillation ([place])   . Sepsis ([place])   . Protein-calorie malnutrition, severe 04/05/2015  . Weakness 04/04/2015  . Anemia 04/04/2015  . HCAP (healthcare-associated pneumonia) 04/04/2015  . COPD (chronic obstructive pulmonary disease) ([place]) 04/04/2015  . HTN (hypertension) 04/04/2015  . HLD (hyperlipidemia) 04/04/2015  . Hodgkin lymphoma of lymph nodes of axilla ([place]) 03/27/2015  . Constipation 01/30/2015  . Neutropenia ([place]) 01/17/2015  . Encounter for antineoplastic chemotherapy 12/19/2014  . Hodgkin lymphoma ([place]) 11/12/2014  . NHL (non-Hodgkin's lymphoma) ([place]) 07/26/2014  . Personal history of colonic polyps - adenomas 11/30/2013  . GERD with stricture 02/05/2011    Orientation RESPIRATION BLADDER Height & Weight    Self, Time, Situation, Place    Continent   184 lbs.  BEHAVIORAL SYMPTOMS/MOOD NEUROLOGICAL BOWEL NUTRITION STATUS      Continent Diet (Heart healthy/carb modified)  AMBULATORY STATUS COMMUNICATION OF NEEDS Skin   Limited Assist Verbally                         Personal Care Assistance Level of Assistance  Bathing, Dressing Bathing Assistance: Limited assistance   Dressing Assistance: Limited assistance     Functional Limitations Info              SPECIAL CARE FACTORS FREQUENCY                       Contractures      Additional Factors Info  Code Status, Allergies Code Status Info:  (Full Code) Allergies Info:  (Aspirin)           Current Medications (04/10/2015):  This is the current hospital active medication list Current Facility-Administered Medications  Medication Dose Route Frequency Provider Last Rate Last Dose  . acetaminophen (TYLENOL) tablet 650 mg  650 mg Oral Q6H PRN Allyne Gee, MD       Or  . acetaminophen (TYLENOL) suppository 650 mg  650 mg Rectal Q6H PRN Allyne Gee, MD      . acetaminophen (TYLENOL) tablet 650 mg  650 mg Oral Q6H PRN Hewitt Shorts Harduk, PA-C   650 mg at 04/08/15 2210  . albuterol (PROVENTIL) (2.5 MG/3ML) 0.083% nebulizer solution 2.5 mg  2.5 mg Nebulization Q6H PRN Allyne Gee, MD      . allopurinol (ZYLOPRIM) tablet 100 mg  100 mg Oral BID Allyne Gee, MD   100 mg at 04/10/15 0942  . apixaban (ELIQUIS) tablet 2.5 mg  2.5 mg Oral BID Randa Spike, RPH      . aspirin EC tablet 81 mg  81 mg Oral q morning - 10a Allyne Gee, MD   81 mg at 04/10/15 [id number]  . diltiazem (CARDIZEM CD) 24 hr  capsule 180 mg  180 mg Oral Daily Verlee Monte, MD   180 mg at 04/10/15 0942  . feeding supplement (BOOST / RESOURCE BREEZE) liquid 1 Container  1 Container Oral BID BM Rosezetta Schlatter, RD   1 Container at 04/10/15 1400  . folic acid (FOLVITE) tablet 1 mg  1 mg Oral Daily Allyne Gee, MD   1 mg at 04/10/15 [id number]  . gi cocktail (Maalox,Lidocaine,Donnatal)  30 mL Oral TID PRN Verlee Monte, MD   30 mL at 04/07/15 1444  . HYDROcodone-acetaminophen (NORCO/VICODIN) 5-325 MG per tablet 1-2 tablet  1-2 tablet Oral Q6H PRN Mickel Baas A Harduk, PA-C      . ipratropium (ATROVENT) nebulizer solution 0.5 mg  0.5 mg Nebulization Q6H PRN Allyne Gee, MD      . metoprolol (LOPRESSOR) injection 10 mg  10 mg Intravenous Q4H PRN Verlee Monte, MD   10 mg at 04/06/15 1318  . metoprolol tartrate (LOPRESSOR)  tablet 25 mg  25 mg Oral BID Verlee Monte, MD   25 mg at 04/10/15 0942  . multivitamin with minerals tablet 1 tablet  1 tablet Oral Daily Allyne Gee, MD   1 tablet at 04/10/15 [phone]  . ondansetron (ZOFRAN) tablet 4 mg  4 mg Oral Q6H PRN Allyne Gee, MD       Or  . ondansetron [facility]) injection 4 mg  4 mg Intravenous Q6H PRN Allyne Gee, MD   4 mg at 04/05/15 1300  . pantoprazole (PROTONIX) injection 40 mg  40 mg Intravenous Daily Verlee Monte, MD   40 mg at 04/10/15 0940  . piperacillin-tazobactam (ZOSYN) IVPB 3.375 g  3.375 g Intravenous 3 times per day Allyne Gee, MD   3.375 g at 04/10/15 1334  . polyethylene glycol (MIRALAX / GLYCOLAX) packet 17 g  17 g Oral Daily Verlee Monte, MD   17 g at 04/10/15 0940  . rosuvastatin (CRESTOR) tablet 10 mg  10 mg Oral q1800 Allyne Gee, MD   10 mg at 04/09/15 1724  . sodium chloride 0.9 % injection 10-40 mL  10-40 mL Intracatheter PRN Allyne Gee, MD   10 mL at 04/10/15 0357  . thiamine (VITAMIN B-1) tablet 100 mg  100 mg Oral Daily Allyne Gee, MD   100 mg at 04/10/15 [id number]  . vancomycin (VANCOCIN) IVPB 750 mg/150 ml premix  750 mg Intravenous Q12H Allyne Gee, MD   750 mg at 04/10/15 1154     Discharge Medications: Please see discharge summary for a list of discharge medications.  Relevant Imaging Results:  Relevant Lab Results:   Additional Information    Ludwig Clarks, LCSW

## 2015-04-10 NOTE — Progress Notes (Signed)
Patient for d/c today to SNF bed at [facility]. Family and patient agreeable to this plan- plan transfer via EMS. Eduard Clos, MSW, [place]

## 2015-04-10 NOTE — Progress Notes (Signed)
Pharmacy Antibiotic Follow-up Note  Jim Beck is a 81 y.o. year-old male admitted on 04/04/2015.  The patient is currently on day 7 of Vancomycin and Zosyn for HCAP.  Assessment/Plan:  Zosyn 3.375g IV Q8H infused over 4hrs.  Vancomycin 750 mg IV q12h.  Recheck Vanc trough if antibiotics are not narrowed.  Follow up renal fxn, culture results, and clinical course.  MD:  Recommend narrowing antibiotics and adding stop date to complete 7 days.  Temp (24hrs), Avg:99.6 F (37.6 C), Min:98.7 F (37.1 C), Max:100.5 F (38.1 C)   Recent Labs Lab 04/06/15 0830 04/07/15 0500 04/08/15 0420 04/09/15 0504 04/10/15 0357  WBC 4.0 3.3* 3.2* 3.3* 3.5*    Recent Labs Lab 04/06/15 0830 04/07/15 0500 04/08/15 0420 04/09/15 0504 04/10/15 0357  CREATININE 1.30* 1.31* 1.31* 1.36* 1.51*   Estimated Creatinine Clearance: 41.6 mL/min (by C-G formula based on Cr of 1.51).    Allergies  Allergen Reactions  . Aspirin     325mg  gives him shakes but low dose 81mg  is fine    Antimicrobials this admission: 04/04/2015 >> vanc >> 04/04/2015 >> zosyn >>  Levels/dose changes this admission: 12/11: VT at 11:00 = 19 on 750mg  q12h (drawn ~10hr after previous dose), no change  Microbiology results: 12/8 bldx2: ngtd 12/8 urine: 3k insignificant growth 12/9 sputum: normal flora 12/9 strep/legionella: neg/neg 12/9 influenza: neg  Thank you for allowing pharmacy to be a part of this patient's care. Gretta Arab PharmD, BCPS Pager [phone] 04/10/2015 10:48 AM

## 2015-04-10 NOTE — Progress Notes (Signed)
ANTICOAGULATION CONSULT NOTE - Follow Up Consult  Pharmacy Consult for Apixaban Indication: atrial fibrillation  Vital Signs: Temp: 100.5 F (38.1 C) (12/14 0456) Temp Source: Oral (12/14 0456) BP: 105/66 mmHg (12/14 0456) Pulse Rate: 78 (12/14 0456)  Labs:  Recent Labs  04/08/15 0420 04/09/15 0504 04/10/15 0357  HGB 8.4* 8.6* 8.5*  HCT 27.6* 28.3* 28.6*  PLT 264 299 369  CREATININE 1.31* 1.36* 1.51*   Estimated Creatinine Clearance: 41.6 mL/min (by C-G formula based on Cr of 1.51).  Assessment: 52 yoM [facility] with weakness and chest pain, noted to be in Afib. PMH includes Hodgkin's lymphoma on chemotherapy. No prior anticoagulants.  CT angiogram negative for PE. Pharmacy was initially consulted to dose Heparin IV for afib, then transitioned to oral apixaban on 12/9.  Today, 04/10/2015:   SCr increased to 1.5, CrCl ~ 41 ml/min  Weight ~ 84 kg  Age = 82 yrs  CBC: Hgb remains low but stable at 8.5.  Plt remain WNL  No bleeding or complications reported by MD or RN.  Goal of Therapy:  Monitor platelets by anticoagulation protocol: Yes   Plan:  Reduce to Apixaban 2.5mg  PO BID. Continue to monitor renal function, and CBC.  Gretta Arab PharmD, BCPS Pager [phone] 04/10/2015 11:09 AM

## 2015-04-11 ENCOUNTER — Inpatient Hospital Stay (HOSPITAL_COMMUNITY)
Admission: EM | Admit: 2015-04-11 | Discharge: 2015-04-28 | DRG: 871 | Disposition: E | Payer: Medicare Other | Attending: Internal Medicine | Admitting: Internal Medicine

## 2015-04-11 ENCOUNTER — Emergency Department (HOSPITAL_COMMUNITY): Payer: Medicare Other

## 2015-04-11 ENCOUNTER — Encounter (HOSPITAL_COMMUNITY): Payer: Self-pay | Admitting: Emergency Medicine

## 2015-04-11 DIAGNOSIS — J189 Pneumonia, unspecified organism: Secondary | ICD-10-CM | POA: Diagnosis present

## 2015-04-11 DIAGNOSIS — D638 Anemia in other chronic diseases classified elsewhere: Secondary | ICD-10-CM | POA: Diagnosis present

## 2015-04-11 DIAGNOSIS — R739 Hyperglycemia, unspecified: Secondary | ICD-10-CM | POA: Diagnosis present

## 2015-04-11 DIAGNOSIS — F329 Major depressive disorder, single episode, unspecified: Secondary | ICD-10-CM | POA: Diagnosis present

## 2015-04-11 DIAGNOSIS — C8197 Hodgkin lymphoma, unspecified, spleen: Secondary | ICD-10-CM | POA: Diagnosis present

## 2015-04-11 DIAGNOSIS — J9621 Acute and chronic respiratory failure with hypoxia: Secondary | ICD-10-CM | POA: Diagnosis present

## 2015-04-11 DIAGNOSIS — I420 Dilated cardiomyopathy: Secondary | ICD-10-CM | POA: Diagnosis present

## 2015-04-11 DIAGNOSIS — Z7982 Long term (current) use of aspirin: Secondary | ICD-10-CM

## 2015-04-11 DIAGNOSIS — N189 Chronic kidney disease, unspecified: Secondary | ICD-10-CM | POA: Diagnosis not present

## 2015-04-11 DIAGNOSIS — Z66 Do not resuscitate: Secondary | ICD-10-CM | POA: Diagnosis not present

## 2015-04-11 DIAGNOSIS — Z87891 Personal history of nicotine dependence: Secondary | ICD-10-CM | POA: Diagnosis not present

## 2015-04-11 DIAGNOSIS — I10 Essential (primary) hypertension: Secondary | ICD-10-CM | POA: Diagnosis not present

## 2015-04-11 DIAGNOSIS — E870 Hyperosmolality and hypernatremia: Secondary | ICD-10-CM | POA: Diagnosis present

## 2015-04-11 DIAGNOSIS — K219 Gastro-esophageal reflux disease without esophagitis: Secondary | ICD-10-CM | POA: Diagnosis present

## 2015-04-11 DIAGNOSIS — R001 Bradycardia, unspecified: Secondary | ICD-10-CM | POA: Diagnosis not present

## 2015-04-11 DIAGNOSIS — R6521 Severe sepsis with septic shock: Secondary | ICD-10-CM | POA: Diagnosis not present

## 2015-04-11 DIAGNOSIS — Z79899 Other long term (current) drug therapy: Secondary | ICD-10-CM | POA: Diagnosis not present

## 2015-04-11 DIAGNOSIS — Z886 Allergy status to analgesic agent status: Secondary | ICD-10-CM

## 2015-04-11 DIAGNOSIS — N179 Acute kidney failure, unspecified: Secondary | ICD-10-CM | POA: Diagnosis present

## 2015-04-11 DIAGNOSIS — Z6825 Body mass index (BMI) 25.0-25.9, adult: Secondary | ICD-10-CM | POA: Diagnosis not present

## 2015-04-11 DIAGNOSIS — J9601 Acute respiratory failure with hypoxia: Secondary | ICD-10-CM

## 2015-04-11 DIAGNOSIS — C8199 Hodgkin lymphoma, unspecified, extranodal and solid organ sites: Secondary | ICD-10-CM | POA: Diagnosis present

## 2015-04-11 DIAGNOSIS — Z9221 Personal history of antineoplastic chemotherapy: Secondary | ICD-10-CM

## 2015-04-11 DIAGNOSIS — C8191 Hodgkin lymphoma, unspecified, lymph nodes of head, face, and neck: Secondary | ICD-10-CM | POA: Diagnosis present

## 2015-04-11 DIAGNOSIS — G9341 Metabolic encephalopathy: Secondary | ICD-10-CM | POA: Diagnosis not present

## 2015-04-11 DIAGNOSIS — Y95 Nosocomial condition: Secondary | ICD-10-CM | POA: Diagnosis present

## 2015-04-11 DIAGNOSIS — N184 Chronic kidney disease, stage 4 (severe): Secondary | ICD-10-CM | POA: Diagnosis present

## 2015-04-11 DIAGNOSIS — I5032 Chronic diastolic (congestive) heart failure: Secondary | ICD-10-CM | POA: Diagnosis present

## 2015-04-11 DIAGNOSIS — Z7901 Long term (current) use of anticoagulants: Secondary | ICD-10-CM | POA: Diagnosis not present

## 2015-04-11 DIAGNOSIS — Z8711 Personal history of peptic ulcer disease: Secondary | ICD-10-CM | POA: Diagnosis not present

## 2015-04-11 DIAGNOSIS — D696 Thrombocytopenia, unspecified: Secondary | ICD-10-CM | POA: Diagnosis not present

## 2015-04-11 DIAGNOSIS — C8109 Nodular lymphocyte predominant Hodgkin lymphoma, extranodal and solid organ sites: Secondary | ICD-10-CM | POA: Diagnosis not present

## 2015-04-11 DIAGNOSIS — D899 Disorder involving the immune mechanism, unspecified: Secondary | ICD-10-CM | POA: Diagnosis present

## 2015-04-11 DIAGNOSIS — J440 Chronic obstructive pulmonary disease with acute lower respiratory infection: Secondary | ICD-10-CM | POA: Diagnosis present

## 2015-04-11 DIAGNOSIS — A419 Sepsis, unspecified organism: Principal | ICD-10-CM | POA: Diagnosis present

## 2015-04-11 DIAGNOSIS — I252 Old myocardial infarction: Secondary | ICD-10-CM

## 2015-04-11 DIAGNOSIS — E785 Hyperlipidemia, unspecified: Secondary | ICD-10-CM | POA: Diagnosis present

## 2015-04-11 DIAGNOSIS — R54 Age-related physical debility: Secondary | ICD-10-CM | POA: Diagnosis present

## 2015-04-11 DIAGNOSIS — J811 Chronic pulmonary edema: Secondary | ICD-10-CM

## 2015-04-11 DIAGNOSIS — J9690 Respiratory failure, unspecified, unspecified whether with hypoxia or hypercapnia: Secondary | ICD-10-CM | POA: Diagnosis present

## 2015-04-11 DIAGNOSIS — E876 Hypokalemia: Secondary | ICD-10-CM | POA: Diagnosis present

## 2015-04-11 DIAGNOSIS — R64 Cachexia: Secondary | ICD-10-CM | POA: Diagnosis present

## 2015-04-11 DIAGNOSIS — E43 Unspecified severe protein-calorie malnutrition: Secondary | ICD-10-CM | POA: Diagnosis present

## 2015-04-11 DIAGNOSIS — N400 Enlarged prostate without lower urinary tract symptoms: Secondary | ICD-10-CM | POA: Diagnosis present

## 2015-04-11 DIAGNOSIS — I272 Other secondary pulmonary hypertension: Secondary | ICD-10-CM | POA: Diagnosis present

## 2015-04-11 DIAGNOSIS — I130 Hypertensive heart and chronic kidney disease with heart failure and stage 1 through stage 4 chronic kidney disease, or unspecified chronic kidney disease: Secondary | ICD-10-CM | POA: Diagnosis present

## 2015-04-11 DIAGNOSIS — Z515 Encounter for palliative care: Secondary | ICD-10-CM | POA: Diagnosis present

## 2015-04-11 DIAGNOSIS — I480 Paroxysmal atrial fibrillation: Secondary | ICD-10-CM | POA: Diagnosis present

## 2015-04-11 DIAGNOSIS — R011 Cardiac murmur, unspecified: Secondary | ICD-10-CM | POA: Diagnosis present

## 2015-04-11 DIAGNOSIS — J9600 Acute respiratory failure, unspecified whether with hypoxia or hypercapnia: Secondary | ICD-10-CM | POA: Diagnosis not present

## 2015-04-11 DIAGNOSIS — J441 Chronic obstructive pulmonary disease with (acute) exacerbation: Secondary | ICD-10-CM | POA: Diagnosis present

## 2015-04-11 LAB — BASIC METABOLIC PANEL
Anion gap: 11 (ref 5–15)
BUN: 36 mg/dL — AB (ref 6–20)
CALCIUM: 8 mg/dL — AB (ref 8.9–10.3)
CHLORIDE: 103 mmol/L (ref 101–111)
CO2: 23 mmol/L (ref 22–32)
CREATININE: 2.53 mg/dL — AB (ref 0.61–1.24)
GFR, EST AFRICAN AMERICAN: 26 mL/min — AB (ref >60)
GFR, EST NON AFRICAN AMERICAN: 22 mL/min — AB (ref >60)
Glucose, Bld: 217 mg/dL — ABNORMAL HIGH (ref 65–99)
Potassium: 4.3 mmol/L (ref 3.5–5.1)
SODIUM: 137 mmol/L (ref 135–145)

## 2015-04-11 LAB — CBC WITH DIFFERENTIAL/PLATELET
BASOS PCT: 0 %
Basophils Absolute: 0 10*3/uL (ref 0.0–0.1)
EOS ABS: 0 10*3/uL (ref 0.0–0.7)
Eosinophils Relative: 0 %
HCT: 31.2 % — ABNORMAL LOW (ref 39.0–52.0)
Hemoglobin: 9.3 g/dL — ABNORMAL LOW (ref 13.0–17.0)
Lymphocytes Relative: 20 %
Lymphs Abs: 1 10*3/uL (ref 0.7–4.0)
MCH: 24.3 pg — AB (ref 26.0–34.0)
MCHC: 29.8 g/dL — ABNORMAL LOW (ref 30.0–36.0)
MCV: 81.7 fL (ref 78.0–100.0)
MONO ABS: 1.5 10*3/uL — AB (ref 0.1–1.0)
Monocytes Relative: 29 %
NEUTROS ABS: 2.6 10*3/uL (ref 1.7–7.7)
Neutrophils Relative %: 51 %
PLATELETS: 426 10*3/uL — AB (ref 150–400)
RBC: 3.82 MIL/uL — ABNORMAL LOW (ref 4.22–5.81)
RDW: 24.7 % — ABNORMAL HIGH (ref 11.5–15.5)
WBC: 5.1 10*3/uL (ref 4.0–10.5)

## 2015-04-11 LAB — I-STAT CHEM 8, ED
BUN: 50 mg/dL — AB (ref 6–20)
CHLORIDE: 100 mmol/L — AB (ref 101–111)
CREATININE: 2.2 mg/dL — AB (ref 0.61–1.24)
Calcium, Ion: 1.11 mmol/L — ABNORMAL LOW (ref 1.13–1.30)
Glucose, Bld: 182 mg/dL — ABNORMAL HIGH (ref 65–99)
HEMATOCRIT: 35 % — AB (ref 39.0–52.0)
HEMOGLOBIN: 11.9 g/dL — AB (ref 13.0–17.0)
Potassium: 4.9 mmol/L (ref 3.5–5.1)
Sodium: 136 mmol/L (ref 135–145)
TCO2: 23 mmol/L (ref 0–100)

## 2015-04-11 LAB — I-STAT CG4 LACTIC ACID, ED
LACTIC ACID, VENOUS: 3.56 mmol/L — AB (ref 0.5–2.0)
Lactic Acid, Venous: 9.51 mmol/L (ref 0.5–2.0)

## 2015-04-11 LAB — I-STAT ARTERIAL BLOOD GAS, ED
ACID-BASE DEFICIT: 1 mmol/L (ref 0.0–2.0)
BICARBONATE: 23.7 meq/L (ref 20.0–24.0)
O2 Saturation: 98 %
PH ART: 7.4 (ref 7.350–7.450)
TCO2: 25 mmol/L (ref 0–100)
pCO2 arterial: 38.8 mmHg (ref 35.0–45.0)
pO2, Arterial: 116 mmHg — ABNORMAL HIGH (ref 80.0–100.0)

## 2015-04-11 LAB — URINE MICROSCOPIC-ADD ON: SQUAMOUS EPITHELIAL / LPF: NONE SEEN

## 2015-04-11 LAB — GLUCOSE, CAPILLARY
GLUCOSE-CAPILLARY: 139 mg/dL — AB (ref 65–99)
Glucose-Capillary: 144 mg/dL — ABNORMAL HIGH (ref 65–99)

## 2015-04-11 LAB — URINALYSIS, ROUTINE W REFLEX MICROSCOPIC
BILIRUBIN URINE: NEGATIVE
Glucose, UA: NEGATIVE mg/dL
KETONES UR: 15 mg/dL — AB
Leukocytes, UA: NEGATIVE
NITRITE: NEGATIVE
PROTEIN: 100 mg/dL — AB
SPECIFIC GRAVITY, URINE: 1.021 (ref 1.005–1.030)
pH: 5 (ref 5.0–8.0)

## 2015-04-11 LAB — HEPARIN LEVEL (UNFRACTIONATED): HEPARIN UNFRACTIONATED: 2 [IU]/mL — AB (ref 0.30–0.70)

## 2015-04-11 LAB — TROPONIN I
TROPONIN I: 0.26 ng/mL — AB (ref <0.031)
TROPONIN I: 0.34 ng/mL — AB (ref <0.031)

## 2015-04-11 LAB — LACTIC ACID, PLASMA: Lactic Acid, Venous: 1.1 mmol/L (ref 0.5–2.0)

## 2015-04-11 LAB — APTT
APTT: 35 s (ref 24–37)
APTT: 54 s — AB (ref 24–37)

## 2015-04-11 LAB — BRAIN NATRIURETIC PEPTIDE: B NATRIURETIC PEPTIDE 5: 523.3 pg/mL — AB (ref 0.0–100.0)

## 2015-04-11 LAB — CBG MONITORING, ED
GLUCOSE-CAPILLARY: 173 mg/dL — AB (ref 65–99)
Glucose-Capillary: 189 mg/dL — ABNORMAL HIGH (ref 65–99)

## 2015-04-11 MED ORDER — HEPARIN BOLUS VIA INFUSION
4000.0000 [IU] | Freq: Once | INTRAVENOUS | Status: DC
  Filled 2015-04-11: qty 4000

## 2015-04-11 MED ORDER — PIPERACILLIN-TAZOBACTAM 3.375 G IVPB
3.3750 g | Freq: Three times a day (TID) | INTRAVENOUS | Status: DC
  Administered 2015-04-12: 3.375 g via INTRAVENOUS
  Filled 2015-04-11 (×4): qty 50

## 2015-04-11 MED ORDER — HEPARIN (PORCINE) IN NACL 100-0.45 UNIT/ML-% IJ SOLN
1400.0000 [IU]/h | INTRAMUSCULAR | Status: DC
  Administered 2015-04-11: 1200 [IU]/h via INTRAVENOUS
  Filled 2015-04-11 (×3): qty 250

## 2015-04-11 MED ORDER — VANCOMYCIN HCL IN DEXTROSE 1-5 GM/200ML-% IV SOLN
1000.0000 mg | INTRAVENOUS | Status: DC
  Filled 2015-04-11: qty 200

## 2015-04-11 MED ORDER — VANCOMYCIN HCL 500 MG IV SOLR
500.0000 mg | Freq: Once | INTRAVENOUS | Status: AC
  Administered 2015-04-11: 500 mg via INTRAVENOUS
  Filled 2015-04-11: qty 500

## 2015-04-11 MED ORDER — SODIUM CHLORIDE 0.9 % IV SOLN
INTRAVENOUS | Status: DC
Start: 2015-04-11 — End: 2015-04-21
  Administered 2015-04-11 – 2015-04-20 (×7): via INTRAVENOUS

## 2015-04-11 MED ORDER — SODIUM CHLORIDE 0.9 % IV BOLUS (SEPSIS)
1000.0000 mL | INTRAVENOUS | Status: AC
  Administered 2015-04-11 (×2): 1000 mL via INTRAVENOUS

## 2015-04-11 MED ORDER — NOREPINEPHRINE BITARTRATE 1 MG/ML IV SOLN
2.0000 ug/min | INTRAVENOUS | Status: DC
  Administered 2015-04-11: 5 ug/min via INTRAVENOUS
  Filled 2015-04-11: qty 4

## 2015-04-11 MED ORDER — PIPERACILLIN-TAZOBACTAM 3.375 G IVPB 30 MIN
3.3750 g | Freq: Once | INTRAVENOUS | Status: AC
  Administered 2015-04-11: 3.375 g via INTRAVENOUS
  Filled 2015-04-11: qty 50

## 2015-04-11 MED ORDER — VANCOMYCIN HCL IN DEXTROSE 1-5 GM/200ML-% IV SOLN
1000.0000 mg | Freq: Once | INTRAVENOUS | Status: AC
Start: 2015-04-11 — End: 2015-04-11
  Administered 2015-04-11: 1000 mg via INTRAVENOUS
  Filled 2015-04-11: qty 200

## 2015-04-11 MED ORDER — INSULIN ASPART 100 UNIT/ML ~~LOC~~ SOLN
0.0000 [IU] | SUBCUTANEOUS | Status: DC
  Administered 2015-04-11: 1 [IU] via SUBCUTANEOUS
  Administered 2015-04-11 – 2015-04-13 (×4): 2 [IU] via SUBCUTANEOUS
  Administered 2015-04-14: 1 [IU] via SUBCUTANEOUS
  Administered 2015-04-14: 2 [IU] via SUBCUTANEOUS
  Administered 2015-04-15: 1 [IU] via SUBCUTANEOUS
  Administered 2015-04-15: 2 [IU] via SUBCUTANEOUS
  Filled 2015-04-11: qty 1

## 2015-04-11 MED ORDER — ACETAMINOPHEN 500 MG PO TABS
1000.0000 mg | ORAL_TABLET | Freq: Once | ORAL | Status: DC
Start: 2015-04-11 — End: 2015-04-14

## 2015-04-11 MED ORDER — SODIUM CHLORIDE 0.9 % IV SOLN
250.0000 mL | INTRAVENOUS | Status: DC | PRN
  Administered 2015-04-11: 250 mL via INTRAVENOUS

## 2015-04-11 NOTE — ED Notes (Signed)
Eddie Dibbles, NP, made aware family is at bedside.  Family advised admitting team would be by room to discuss plan of care.

## 2015-04-11 NOTE — ED Notes (Addendum)
Intensivist NP at bedside  

## 2015-04-11 NOTE — Progress Notes (Signed)
Pt taken off of BIPAP, placed on 6L Jim Beck. Pt tolerating well at this time, no obvious distress noted, SpO2 99%. RT will continue to monitor.

## 2015-04-11 NOTE — ED Notes (Addendum)
Per Dr. Nelda Marseille okay to titrate Levophed to 4mcg.

## 2015-04-11 NOTE — ED Notes (Signed)
NP at bedside.

## 2015-04-11 NOTE — ED Notes (Signed)
Admitting MD made aware of pt's BP trending down (74/57).  Per MD notify if BP still low after 2nd NS bolus complete.

## 2015-04-11 NOTE — ED Notes (Signed)
Intensivist NPs at bedside. 

## 2015-04-11 NOTE — H&P (Signed)
PULMONARY / CRITICAL CARE MEDICINE   Name: Jim Beck MRN: [id number] DOB: Sep 03, 1932    ADMISSION DATE:  04/15/2015   REFERRING MD:  EDP  CHIEF COMPLAINT:  Respiratory failure, sepsis   HISTORY OF PRESENT ILLNESS:   81yo male former smoker with hx COPD, HTN, remote hx polysubstance abuse, Hodgkin's lymphoma (initially dx 1979, recurrence 2011) s/p chemotherapy with last dose 03/27/15.  Just admitted 12/8-12/14 with HCAP, sepsis, discharged on ceftin.  Previously lived at home with his wife but d/c to SNF 12/14 r/t significant weakness, debilitation, malnutrition.  On am 12/15 he was found un[facility] at [facility] and [facility] ED.  On arrival, O2 sats 64%, BP 84/61, lactate 9.5. Placed on NRB, given IVF and PCCM consulted for ICU admission.    Pt states he is feeling better since arrival.  Thinks that this was prompted by him taking off his nasal cannula to clean it and forgetting to put it back on.  He c/o productive cough but unable to fully expectorate due to weakness, also c/o malaise and mild SOB. Denies hemoptysis, fevers, chills.    PAST MEDICAL HISTORY :  He  has a past medical history of COPD (chronic obstructive pulmonary disease) ([place]); Tobacco abuse; Hypertension; Hyperlipemia; GERD (gastroesophageal reflux disease); blood diseases; PUD (peptic ulcer disease); BPH (benign prostatic hypertrophy); History of alcohol abuse; Heart murmur; Diverticulosis; External hemorrhoids; Arthritis; Myocardial infarction ([place]); Substance abuse; Personal history of colonic polyps - adenomas (11/30/2013); Shortness of breath dyspnea; Depression; Constipation; History of hiatal hernia; and Hodgkin lymphoma ([place]) (07/2009).  PAST SURGICAL HISTORY: He  has past surgical history that includes Inguinal hernia repair; Upper gastrointestinal endoscopy (03/08/2002); Colonoscopy (03/08/2002); Lung surgery (2011); Esophageal dilation; Throat cancer; and Lymph node biopsy (Left, 09/26/2014).  Allergies  Allergen  Reactions  . Aspirin     325mg  gives him shakes but low dose 81mg  is fine    No current facility-administered medications on file prior to encounter.   Current Outpatient Prescriptions on File Prior to Encounter  Medication Sig  . allopurinol (ZYLOPRIM) 100 MG tablet Take 1 tablet (100 mg total) by mouth 2 (two) times daily.  . OXYGEN Inhale 2 L into the lungs continuous.  . rosuvastatin (CRESTOR) 10 MG tablet Take 10 mg by mouth every morning.   Marland Kitchen apixaban (ELIQUIS) 2.5 MG TABS tablet Take 1 tablet (2.5 mg total) by mouth 2 (two) times daily.  . cefUROXime (CEFTIN) 500 MG tablet Take 1 tablet (500 mg total) by mouth 2 (two) times daily with a meal. For 3 more days  . diltiazem (CARDIZEM CD) 180 MG 24 hr capsule Take 1 capsule (180 mg total) by mouth daily.  Marland Kitchen lidocaine-prilocaine (EMLA) cream Apply 1 application topically as needed.  . metoprolol tartrate (LOPRESSOR) 25 MG tablet Take 1 tablet (25 mg total) by mouth 2 (two) times daily.    FAMILY HISTORY:  His indicated that his mother is deceased. He indicated that his father is deceased.   SOCIAL HISTORY: He  reports that he quit smoking about 37 years ago. He has never used smokeless tobacco. He reports that he does not drink alcohol or use illicit drugs.  REVIEW OF SYSTEMS:   As per HPI - All other systems reviewed and were neg.    SUBJECTIVE:    VITAL SIGNS: BP 94/78 mmHg  Pulse 70  Temp(Src) 101.2 F (38.4 C) (Rectal)  Resp 32  SpO2 95%  HEMODYNAMICS:    VENTILATOR SETTINGS:    INTAKE / OUTPUT:  PHYSICAL EXAMINATION: General:  Frail, thin, chronically ill appearing male, NAD  Neuro:  Somnolent but easily arousable, appropriate, seems oriented x 3, MAE, gen weakness  HEENT:  Mm dry, NRB  Cardiovascular:  s1s2rrr  Lungs:  resps even, non labored on NRB, diminished L>R  Abdomen:  Soft, +bs  Musculoskeletal:  Warm and dry, 1+ BLE edema   LABS:  BMET  Recent Labs Lab 04/08/15 0420 04/09/15 0504  04/10/15 0357 04/20/2015 1125  NA 133* 137 134* 136  K 4.2 3.9 3.6 4.9  CL 100* 103 99* 100*  CO2 28 27 29   --   BUN 26* 27* 27* 50*  CREATININE 1.31* 1.36* 1.51* 2.20*  GLUCOSE 128* 108* 119* 182*    Electrolytes  Recent Labs Lab 04/08/15 0420 04/09/15 0504 04/10/15 0357  CALCIUM 8.3* 8.9 8.6*    CBC  Recent Labs Lab 04/09/15 0504 04/10/15 0357 04/26/2015 1113 04/14/2015 1125  WBC 3.3* 3.5* 5.1  --   HGB 8.6* 8.5* 9.3* 11.9*  HCT 28.3* 28.6* 31.2* 35.0*  PLT 299 369 426*  --     Coag's  Recent Labs Lab 04/04/15 1754  APTT 30  INR 1.26    Sepsis Markers  Recent Labs Lab 04/04/15 1755 04/05/15 0205 04/16/2015 1126  LATICACIDVEN 1.54 1.1 9.51*    ABG  Recent Labs Lab 04/27/2015 1215  PHART 7.400  PCO2ART 38.8  PO2ART 116.0*    Liver Enzymes  Recent Labs Lab 04/04/15 1745  AST 16  ALT 15*  ALKPHOS 48  BILITOT 0.5  ALBUMIN 2.5*    Cardiac Enzymes No results for input(s): TROPONINI, PROBNP in the last 168 hours.  Glucose  Recent Labs Lab 04/07/15 0720 04/08/15 0741 04/09/15 0751 04/09/15 1148 04/09/15 1638 04/10/15 0730  GLUCAP 138* 99 88 158* 134* 103*    Imaging Dg Chest Port 1 View  04/08/2015  CLINICAL DATA:  Low saturations this morning, shortness of breath, just released from hospital with pneumonia, history smoking, hypertension, COPD, MI, Hodgkin's disease EXAM: PORTABLE CHEST 1 VIEW COMPARISON:  Portable exam 1123 hours compared to 04/04/2015 FINDINGS: RIGHT jugular Port-A-Cath with tip projecting over SVC. Enlargement of cardiac silhouette. Large hiatal hernia. Mediastinal contours otherwise normal. Increased LEFT upper and LEFT lower lobe infiltrates consistent with pneumonia. Underlying emphysematous changes. No pleural effusion or pneumothorax. Prior resection at LEFT apex. No acute bony findings. IMPRESSION: COPD changes with increased LEFT lung infiltrates consistent with pneumonia. Enlargement of cardiac silhouette.  Large hiatal hernia. Electronically Signed   By: Lavonia Dana M.D.   On: 04/24/2015 12:06     STUDIES:  12/15 CXR >> L sided infiltrate   CULTURES: Urine 12/15>>> BC x 2 12/15>>>  ANTIBIOTICS: Vancomycin 12/15>>> Zosyn 12/15>>>  SIGNIFICANT EVENTS:   LINES/TUBES:   DISCUSSION: 81yo immunocompromised male r/t chemo therapy for Recurrence Hodkins lymphoma, just d/c 12/14, returns with PNA, sepsis.    ASSESSMENT / PLAN:  PULMONARY Acute hypoxic respiratory failure  HCAP  Hx COPD  P:   Supplemental O2 - wean as able  F/u CXR  Pulmonary hygiene as able  Abx as below  BD Sputum culture if able   CARDIOVASCULAR Hx HTN  AFib  Sepsis  sCHF - EF 55% (Echo 04/05/15) P:  ASA  Hold home diltiazem, lopressor  Hold eliquis for now  Heparin gtt per pharmacy  F/u BNP  Trend lactate  Hold further volume for now   RENAL AKI on CKD - baseline Scr ~ 1.1 P:   Volume given in  ER  F/u lactate  F/u chem   GASTROINTESTINAL Protein calorie malnutrition  P:   NPO for now  Dietitian consult   HEMATOLOGIC Anemia - chronic  hodgkin's lymphoma - recurrent.  S/p chemo (last dose 11/30) P:  F/u cbc   INFECTIOUS HCAP  Immunocompromised  P:   Vanc, zosyn as above  Trend pct  Urine strep, legionella neg last admit  Sputum culture if able   ENDOCRINE Hyperglycemia   P:   SSI   NEUROLOGIC AMS - resolved.  R/t hypoxia. Now alert, appropriate.  P:   Supportive care     FAMILY  - Updates:  Daughter, carolyn, updated over phone 12/15.    Pt alert and oriented and stated that he is "tired of all this" and feels like he just gets worse and worse.  He does NOT wish to be intubated if he were to worsen.  However, when I spoke to carolyn about this, she said "well I will just override that" and that she wishes for him to remain full code.  After further discussions regarding my concern that, if placed on the vent, it would be very difficult to wean him given his poor  nutritional and functional status at baseline c/b recurrent Hodkins lymphoma, chemotherapy, heart disease, she did seem more receptive to her fathers wishes.  [name] is currently taking her mother to an appointment and will touch base with me when she returns to the hospital to discuss further.     Nickolas Madrid, NP 03/31/2015  1:11 PM Pager: [phone] or [phone]  Attending Note:  81 year old male, recently discharged for pneumonia to SNF returning to the hospital for AMS, worsening hypoxemia and lactic acidosis with some borderline BP. The patient responded to 2L IVF with stabilization of his BP but is requiring 100% NRB for sat of 94%. Spoke with the patient, he does not wish for aggressive care however daughter and wife insist on aggressive care. Will maintain on 100% for now, admit to the ICU, start vanc and zosyn, titrate O2 as able. Will need to discuss further with daughter regarding code status, she is currently taking her mother to an appointment then will come to the hospital. If respiratory status deteriorates then will advance to BiPAP.    In the process patient starting dropping his pressure and desaturating.  Pulmonary edema suspected.  Spoke with daughter again over the phone.  Informed her that her father does not wish for aggressive interventions but already has a port.  She agreed to DNR and is on her way to the hospital.  Will start norepi with a max of 10 mcg through the port and will start BiPAP.  If remains insufficient then will convert to comfort care.  The patient is critically ill with multiple organ systems failure and requires high complexity decision making for assessment and support, frequent evaluation and titration of therapies, application of advanced monitoring technologies and extensive interpretation of multiple databases.   Critical Care Time devoted to patient care services described in this note is 35 Minutes. This time reflects time of care  of this signee Dr Jennet Maduro. This critical care time does not reflect procedure time, or teaching time or supervisory time of PA/NP/Med student/Med Resident etc but could involve care discussion time.  Rush Farmer, M.D. [facility] Pulmonary/Critical Care Medicine. Pager: [phone]. After hours pager: [phone].

## 2015-04-11 NOTE — ED Notes (Signed)
Pt arrives from [facility] via EMS. EMS reports pt found unresponsive this morning, reports fire dept reports pt not wearing [place].  EMS reports pt lives on 2L.  EMS reports pt AOx4 upon arrival.  Initial O2 sats upon arrival 64% on 2L.  Pt placed on NRB, now 100%, RR 35.  Dr Jeneen Rinks at bedside

## 2015-04-11 NOTE — Progress Notes (Signed)
Notified MD Wert in regards to patient weaned off Levophed, BP stable at this time, lactic acid 1.1, questioned also about Normal Saline going at 85ml/hr. Will keep that going at this time per MD Wert. Will continue to monitor and assess.

## 2015-04-11 NOTE — Progress Notes (Signed)
Patient transported from ED to 2MW08 without any complications. Vital signs stable throughout. Patient tolerated well. RN at bedside. RT will continue to monitor.

## 2015-04-11 NOTE — Clinical Social Work Note (Signed)
Clinical Social Work Assessment  Patient Details  Name: Jim Beck MRN: [id number] Date of Birth: 05-16-1932  Date of referral:  04/25/2015               Reason for consult:  Facility Placement                Permission sought to share information with:  Family Supports Permission granted to share information::  No  Name::        Agency::     Relationship::     Contact Information:     Housing/Transportation Living arrangements for the past 2 months:  [place] of Information:  Spouse, Adult Children Patient Interpreter Needed:  None Criminal Activity/Legal Involvement Pertinent to Current Situation/Hospitalization:  No - Comment as needed Significant Relationships:  Adult Children, Spouse Lives with:  Facility Resident Do you feel safe going back to the place where you live?  Yes Need for family participation in patient care:  No (Coment)  Care giving concerns:  Family concerned regarding patient's health and fear that he may not make it out of the hospital.   Social Worker assessment / plan:  CSW met with patient, his wife, and daughter at Patient's bedside. Patient is currently on BiPAP. Patient's family reports that they would like Patient to return to [facility] upon discharge if he is capable. [facility] reports that they are willing to take him back as well upon discharge from the hospital. CSW will continue to follow.   Employment status:  Disabled (Comment on whether or not currently receiving Disability), Retired Forensic scientist:  Medicare PT Recommendations:  Not assessed at this time Information / Referral to community resources:     Patient/Family's Response to care: Patient's wife and daughter report being satisfied with [facility]. Patient's family agreeable to contact CSW with any concerns or questions.   Patient/Family's Understanding of and Emotional Response to Diagnosis, Current Treatment, and Prognosis:   Patient's daughter presents as anxious regarding patient's current symptomology.   Emotional Assessment Appearance:  Appears stated age Attitude/Demeanor/Rapport:  Unable to Assess Affect (typically observed):  Unable to Assess Orientation:   (Unable to assess at this time- Patient currently on BiPAP) Alcohol / Substance use:  Not Applicable Psych involvement (Current and /or in the community):  No (Comment)  Discharge Needs  Concerns to be addressed:  No discharge needs identified Readmission within the last 30 days:  Yes (Discharged on 04/10/15 from [facility]) Current discharge risk:  None Barriers to Discharge:  No Barriers Identified   Judeth Horn, LCSW 04/21/2015, 4:04 PM

## 2015-04-11 NOTE — ED Provider Notes (Signed)
CSN: [id number]     Arrival date & time 04/07/2015  1038 History   First MD Initiated Contact with Patient 04/04/2015 1044     Chief Complaint  Patient presents with  . Altered Mental Status  . Shortness of Breath      HPI  Patient presents from a care facility with complaint of decreased level of consciousness, and low oxygen.  Patient was discharged yesterday after a four-day stay with a healthcare acquired pneumonia. Cultures negative. Discharged on Ceftin for an additional 3 days, after 5 days of vancomycin and Zosyn.    Today was found by staff to be minimally responsive and nonverbal. He was breathing. His oxygen level was 60%. Upon arrival of first responders he was on a mask. His saturations were in the 80s, and slowly climbed into the 90s en route with paramedics. He became verbally responses, and appropriate en route.  Diagnosis of AF with RVR in the hospital. Rate controlled. Anticoagulated with heparin. Discharged on  [place], and Cardizem CD.  No history of CHF per family report.  Echo:  12/09  Impressions:  - Normal LV wall thickness with LVEF 55-60%. Indeterminate diastolic function. There is evidence of increased LVEDP based on increased end-diastolic PR gradient. There is systolic and diastolic septal flattening consistent with RV pressure and volume overload. mild aortic regurgitation. Right ventricle is moderately to severely dilated with moderately reduced contraction. Moderate tricuspid regurgitation noted with PASP 54 mmHg in the setting of elevated CVP. Compared to the previous study in August of this year, there has been significant deterioration in RV function as well as increased pulmonary artery systolic pressure.    Past Medical History  Diagnosis Date  . COPD (chronic obstructive pulmonary disease) ([place])   . Tobacco abuse   . Hypertension   . Hyperlipemia   . GERD (gastroesophageal reflux disease)     large hiatal hernia and  stricture  . Hx of blood diseases     bullous blood disease  . PUD (peptic ulcer disease)   . BPH (benign prostatic hypertrophy)   . History of alcohol abuse   . Heart murmur   . Diverticulosis   . External hemorrhoids   . Arthritis   . Myocardial infarction ([place])   . Substance abuse   . Personal history of colonic polyps - adenomas 11/30/2013    12 mm and diminutive adenoma - no recall planned (age)  . Shortness of breath dyspnea     with exertion  . Depression   . Constipation   . History of hiatal hernia     per notes  . Hodgkin lymphoma ([place]) 07/2009    head and neck and throat   Past Surgical History  Procedure Laterality Date  . Inguinal hernia repair      bilateral  . Upper gastrointestinal endoscopy  03/08/2002    esophageal stricture, hiatal hernia  . Colonoscopy  03/08/2002    diverticulosis, external hemorrhoids  . Lung surgery  2011  . Esophageal dilation      x2  . Throat cancer    . Lymph node biopsy Left 09/26/2014    Procedure: EXCISIONAL BIOPSY LEFT AXILLARY LYMPH NODE;  Surgeon: Coralie Keens, MD;  Location: [place];  Service: General;  Laterality: Left;   Family History  Problem Relation Age of Onset  . Cancer Brother     abdominal  . Lung cancer Brother   . Throat cancer Sister   . Colon cancer Neg Hx   . Esophageal  cancer Neg Hx   . Rectal cancer Neg Hx   . Stomach cancer Neg Hx    Social History  Substance Use Topics  . Smoking status: Former Smoker    Quit date: 04/27/1977  . Smokeless tobacco: Never Used  . Alcohol Use: No    Review of Systems  Constitutional: Positive for appetite change and fatigue. Negative for fever, chills and diaphoresis.  HENT: Negative for mouth sores, sore throat and trouble swallowing.   Eyes: Negative for visual disturbance.  Respiratory: Positive for cough and shortness of breath. Negative for chest tightness and wheezing.   Cardiovascular: Negative for chest pain.  Gastrointestinal: Negative for nausea,  vomiting, abdominal pain, diarrhea and abdominal distention.  Endocrine: Negative for polydipsia, polyphagia and polyuria.  Genitourinary: Negative for dysuria, frequency and hematuria.  Musculoskeletal: Negative for gait problem.  Skin: Negative for color change, pallor and rash.  Neurological: Positive for weakness. Negative for dizziness, syncope, light-headedness and headaches.  Hematological: Does not bruise/bleed easily.  Psychiatric/Behavioral: Positive for confusion. Negative for behavioral problems.      Allergies  Aspirin  Home Medications   Prior to Admission medications   Medication Sig Start Date End Date Taking? Authorizing Provider  allopurinol (ZYLOPRIM) 100 MG tablet Take 1 tablet (100 mg total) by mouth 2 (two) times daily. 11/27/14  Yes Curt Bears, MD  aspirin 81 MG tablet Take 81 mg by mouth daily.   Yes Historical Provider, MD  OXYGEN Inhale 2 L into the lungs continuous.   Yes Historical Provider, MD  rosuvastatin (CRESTOR) 10 MG tablet Take 10 mg by mouth every morning.    Yes Historical Provider, MD  apixaban (ELIQUIS) 2.5 MG TABS tablet Take 1 tablet (2.5 mg total) by mouth 2 (two) times daily. 04/10/15   Barton Dubois, MD  cefUROXime (CEFTIN) 500 MG tablet Take 1 tablet (500 mg total) by mouth 2 (two) times daily with a meal. For 3 more days 04/10/15   Barton Dubois, MD  diltiazem (CARDIZEM CD) 180 MG 24 hr capsule Take 1 capsule (180 mg total) by mouth daily. 04/09/15   Verlee Monte, MD  lidocaine-prilocaine (EMLA) cream Apply 1 application topically as needed. 01/02/15   Carlton Adam, PA-C  metoprolol tartrate (LOPRESSOR) 25 MG tablet Take 1 tablet (25 mg total) by mouth 2 (two) times daily. 04/09/15   Verlee Monte, MD   BP 73/55 mmHg  Pulse 69  Temp(Src) 101.2 F (38.4 C) (Rectal)  Resp 26  SpO2 92% Physical Exam  Constitutional: He is oriented to person, place, and time. He appears well-developed and well-nourished. No distress.  Patient awake.  Rarely responsive. Able answer simple questions. Does have 3-4 dyspnea.  HENT:  Head: Normocephalic.  Eyes: Conjunctivae are normal. Pupils are equal, round, and reactive to light. No scleral icterus.  Neck: Normal range of motion. Neck supple. No thyromegaly present.  Cardiovascular: Normal rate and regular rhythm.  Exam reveals no gallop and no friction rub.   No murmur heard. Pulmonary/Chest: Accessory muscle usage present. Tachypnea noted. No respiratory distress. He has decreased breath sounds in the right upper field, the right middle field, the right lower field, the left upper field, the left middle field and the left lower field. He has no wheezes. He has rales in the left upper field and the left middle field.  3-4 word dyspnea.  Abdominal: Soft. Bowel sounds are normal. He exhibits no distension. There is no tenderness. There is no rebound.  Musculoskeletal: Normal range of motion.  Neurological: He is alert and oriented to person, place, and time.  Skin: Skin is warm and dry. No rash noted.  1+ symmetric lower extremity edema.  Psychiatric: He has a normal mood and affect. His behavior is normal.    ED Course  Procedures (including critical care time) Labs Review Labs Reviewed  CBC WITH DIFFERENTIAL/PLATELET - Abnormal; Notable for the following:    RBC 3.82 (*)    Hemoglobin 9.3 (*)    HCT 31.2 (*)    MCH 24.3 (*)    MCHC 29.8 (*)    RDW 24.7 (*)    Platelets 426 (*)    Monocytes Absolute 1.5 (*)    All other components within normal limits  BRAIN NATRIURETIC PEPTIDE - Abnormal; Notable for the following:    B Natriuretic Peptide 523.3 (*)    All other components within normal limits  URINALYSIS, ROUTINE W REFLEX MICROSCOPIC (NOT AT [facility]) - Abnormal; Notable for the following:    APPearance TURBID (*)    Hgb urine dipstick MODERATE (*)    Ketones, ur 15 (*)    Protein, ur 100 (*)    All other components within normal limits  URINE MICROSCOPIC-ADD ON - Abnormal;  Notable for the following:    Bacteria, UA RARE (*)    All other components within normal limits  I-STAT CG4 LACTIC ACID, ED - Abnormal; Notable for the following:    Lactic Acid, Venous 9.51 (*)    All other components within normal limits  I-STAT CHEM 8, ED - Abnormal; Notable for the following:    Chloride 100 (*)    BUN 50 (*)    Creatinine, Ser 2.20 (*)    Glucose, Bld 182 (*)    Calcium, Ion 1.11 (*)    Hemoglobin 11.9 (*)    HCT 35.0 (*)    All other components within normal limits  I-STAT ARTERIAL BLOOD GAS, ED - Abnormal; Notable for the following:    pO2, Arterial 116.0 (*)    All other components within normal limits  CULTURE, BLOOD (ROUTINE X 2)  CULTURE, BLOOD (ROUTINE X 2)  URINE CULTURE  BLOOD GAS, ARTERIAL  BASIC METABOLIC PANEL  TROPONIN I  LACTIC ACID, PLASMA  TROPONIN I  TROPONIN I  TROPONIN I  I-STAT CG4 LACTIC ACID, ED    Imaging Review Dg Chest Port 1 View  03/31/2015  CLINICAL DATA:  Low saturations this morning, shortness of breath, just released from hospital with pneumonia, history smoking, hypertension, COPD, MI, Hodgkin's disease EXAM: PORTABLE CHEST 1 VIEW COMPARISON:  Portable exam 1123 hours compared to 04/04/2015 FINDINGS: RIGHT jugular Port-A-Cath with tip projecting over SVC. Enlargement of cardiac silhouette. Large hiatal hernia. Mediastinal contours otherwise normal. Increased LEFT upper and LEFT lower lobe infiltrates consistent with pneumonia. Underlying emphysematous changes. No pleural effusion or pneumothorax. Prior resection at LEFT apex. No acute bony findings. IMPRESSION: COPD changes with increased LEFT lung infiltrates consistent with pneumonia. Enlargement of cardiac silhouette. Large hiatal hernia. Electronically Signed   By: Lavonia Dana M.D.   On: 04/03/2015 12:06   I have personally reviewed and evaluated these images and lab results as part of my medical decision-making.   EKG Interpretation None      MDM   Final  diagnoses:  Sepsis, due to unspecified organism ([place])  HCAP (healthcare-associated pneumonia)    Code sepsis activated. Rectal temp 101.2. Tachycardic. Requiring oxygen. Cannot be tapered to cannula without hypoxemia. ABG requested. Given Tylenol. Initiated on sepsis fluids. Of  note, I did review his most recent echo which shows preserved LV function. Deteriorated RV function. Given vancomycin and Zosyn. Critical care consult it. They're here evaluating the patient.CRITICAL CARE Performed by: Tanna Furry JOSEPH   Total critical care time: 60 minutes  Critical care time was exclusive of separately billable procedures and treating other patients.  Critical care was necessary to treat or prevent imminent or life-threatening deterioration.  Critical care was time spent personally by me on the following activities: development of treatment plan with patient and/or surrogate as well as nursing, discussions with consultants, evaluation of patient's response to treatment, examination of patient, obtaining history from patient or surrogate, ordering and performing treatments and interventions, ordering and review of laboratory studies, ordering and review of radiographic studies, pulse oximetry and re-evaluation of patient's condition. care     Tanna Furry, MD 03/30/2015 [phone]

## 2015-04-11 NOTE — Progress Notes (Signed)
ANTIBIOTIC CONSULT NOTE - INITIAL  Pharmacy Consult for vancomycin and zosyn Indication: pneumonia  Allergies  Allergen Reactions  . Aspirin     325mg  gives him shakes but low dose 81mg  is fine    Patient Measurements:   Weight: 83.6 kg  Vital Signs: Temp: 101.2 F (38.4 C) (12/15 1206) Temp Source: Rectal (12/15 1206) BP: 102/73 mmHg (12/15 1207) Pulse Rate: 73 (12/15 1207) Intake/Output from previous day:   Intake/Output from this shift: Total I/O In: -  Out: 140 [Urine:140]  Labs:  Recent Labs  04/09/15 0504 04/10/15 0357 04/20/2015 1113 04/17/2015 1125  WBC 3.3* 3.5* 5.1  --   HGB 8.6* 8.5* 9.3* 11.9*  PLT 299 369 426*  --   CREATININE 1.36* 1.51*  --  2.20*   Estimated Creatinine Clearance: 28.5 mL/min (by C-G formula based on Cr of 2.2). No results for input(s): VANCOTROUGH, VANCOPEAK, VANCORANDOM, GENTTROUGH, GENTPEAK, GENTRANDOM, TOBRATROUGH, TOBRAPEAK, TOBRARND, AMIKACINPEAK, AMIKACINTROU, AMIKACIN in the last 72 hours.   Microbiology: Recent Results (from the past 720 hour(s))  Culture, blood (x 2)     Status: None   Collection Time: 04/04/15  1:30 AM  Result Value Ref Range Status   Specimen Description BLOOD RIGHT [facility]  Final   Special Requests IN PEDIATRIC BOTTLE [place]  Final   Culture   Final    NO GROWTH 5 DAYS Performed at [facility]    Report Status 04/10/2015 FINAL  Final  Culture, blood (x 2)     Status: None   Collection Time: 04/04/15  1:44 AM  Result Value Ref Range Status   Specimen Description BLOOD RIGHT HAND  Final   Special Requests BOTTLES DRAWN AEROBIC ONLY 5CC  Final   Culture   Final    NO GROWTH 5 DAYS Performed at [facility]    Report Status 04/10/2015 FINAL  Final  Urine culture     Status: None   Collection Time: 04/04/15  8:42 PM  Result Value Ref Range Status   Specimen Description URINE, CLEAN CATCH  Final   Special Requests NONE  Final   Culture   Final    3,000 COLONIES/mL INSIGNIFICANT  GROWTH Performed at [facility]    Report Status 04/06/2015 FINAL  Final  Culture, sputum-assessment     Status: None   Collection Time: 04/05/15  6:31 AM  Result Value Ref Range Status   Specimen Description SPUTUM  Final   Special Requests NONE  Final   Sputum evaluation   Final    MICROSCOPIC FINDINGS SUGGEST THAT THIS SPECIMEN IS NOT REPRESENTATIVE OF LOWER RESPIRATORY SECRETIONS. PLEASE RECOLLECT. NOTIFIED [name] AT [id number] ON 12.9.16 BY SHUEA    Report Status 04/05/2015 FINAL  Final  Culture, expectorated sputum-assessment     Status: None   Collection Time: 04/05/15  5:04 PM  Result Value Ref Range Status   Specimen Description SPUTUM  Final   Special Requests Immunocompromised  Final   Sputum evaluation   Final    THIS SPECIMEN IS ACCEPTABLE. RESPIRATORY CULTURE REPORT TO FOLLOW.   Report Status 04/05/2015 FINAL  Final  Culture, respiratory (NON-Expectorated)     Status: None   Collection Time: 04/05/15  5:04 PM  Result Value Ref Range Status   Specimen Description SPUTUM  Final   Special Requests NONE  Final   Gram Stain   Final    NO WBC SEEN FEW SQUAMOUS EPITHELIAL CELLS PRESENT FEW GRAM NEGATIVE RODS Performed at [facility]  Culture   Final    NORMAL OROPHARYNGEAL FLORA Performed at [facility]    Report Status 04/08/2015 FINAL  Final    Medical History: Past Medical History  Diagnosis Date  . COPD (chronic obstructive pulmonary disease) ([place])   . Tobacco abuse   . Hypertension   . Hyperlipemia   . GERD (gastroesophageal reflux disease)     large hiatal hernia and stricture  . Hx of blood diseases     bullous blood disease  . PUD (peptic ulcer disease)   . BPH (benign prostatic hypertrophy)   . History of alcohol abuse   . Heart murmur   . Diverticulosis   . External hemorrhoids   . Arthritis   . Myocardial infarction ([place])   . Substance abuse   . Personal history of colonic polyps - adenomas 11/30/2013    12 mm and  diminutive adenoma - no recall planned (age)  . Shortness of breath dyspnea     with exertion  . Depression   . Constipation   . History of hiatal hernia     per notes  . Hodgkin lymphoma ([place]) 07/2009    head and neck and throat   Assessment: 81 yo M in ED. Found unresponsive this morning at [facility].  Not wearing [place] (lives on 2L).  Pharmacy consulted to dose zosyn and vancomycin for CAP but pt was just discharged yesterday from [facility] after a 4 day stay with HCAP.  vanc 12/8>>12/14, 12/15 >> Zosyn 12/8>>12/14, 12/15>> 12/11 VT at 11am  = 19 on 750 q12 (drawn ~ 10 hrs after previous dose). Creat was 1.31 on 12/11 Today WBC 5, creat 2.2, normalized creat cl ~ 27 ml/min.  Lactate 8.51, temp 101.2, HR 99. RR 30.   12/15 CXR: increased L lung infiltrates c/w PNA  Goal of Therapy:  Vancomycin trough level 15-20 mcg/ml  Plan:  - zosyn 3.375 gm IV x 1 dose over 30 minutes then zosyn 3.357 gm IV q8h, infuse each dose over 4 hours - vancomycin 1 gm + 500 mg given at the same time in the ED for a 1500 mg loading dose followed by vancomycin 1 gm IV q24 to start 12/16 - f/u renal function, WBC, temp, CXR, culture data, clinical course - Vanc levels as needed  Eudelia Bunch, Pharm.D. BP:[id number] 03/31/2015 12:40 PM

## 2015-04-11 NOTE — ED Notes (Addendum)
Pt's daughter, [name],  [phone], asks to be kept informed.

## 2015-04-11 NOTE — Progress Notes (Signed)
ANTICOAGULATION CONSULT NOTE - Initial Consult  Pharmacy Consult for Heparin Indication: atrial fibrillation  Allergies  Allergen Reactions  . Aspirin     325mg  gives him shakes but low dose 81mg  is fine    Patient Measurements:   Heparin Dosing Weight: 83.6kg  Vital Signs: Temp: 101.2 F (38.4 C) (12/15 1206) Temp Source: Rectal (12/15 1206) BP: 73/55 mmHg (12/15 1320) Pulse Rate: 69 (12/15 1320)  Labs:  Recent Labs  04/09/15 0504 04/10/15 0357 04/23/2015 1113 04/20/2015 1125  HGB 8.6* 8.5* 9.3* 11.9*  HCT 28.3* 28.6* 31.2* 35.0*  PLT 299 369 426*  --   CREATININE 1.36* 1.51*  --  2.20*    Estimated Creatinine Clearance: 28.5 mL/min (by C-G formula based on Cr of 2.2).   Medical History: Past Medical History  Diagnosis Date  . COPD (chronic obstructive pulmonary disease) ([place])   . Tobacco abuse   . Hypertension   . Hyperlipemia   . GERD (gastroesophageal reflux disease)     large hiatal hernia and stricture  . Hx of blood diseases     bullous blood disease  . PUD (peptic ulcer disease)   . BPH (benign prostatic hypertrophy)   . History of alcohol abuse   . Heart murmur   . Diverticulosis   . External hemorrhoids   . Arthritis   . Myocardial infarction ([place])   . Substance abuse   . Personal history of colonic polyps - adenomas 11/30/2013    12 mm and diminutive adenoma - no recall planned (age)  . Shortness of breath dyspnea     with exertion  . Depression   . Constipation   . History of hiatal hernia     per notes  . Hodgkin lymphoma ([place]) 07/2009    head and neck and throat    Medications:  Scheduled:  . acetaminophen  1,000 mg Oral Once  . insulin aspart  0-9 Units Subcutaneous 6 times per day    Assessment: 63 yoM presenting to ED from care facility with decreased consciousness and low oxygen. Patient recently diagnosed with AF with RVR at [facility] during admission. Patient was started on apixaban. Last dose documented at [facility] 12/14 @ 0942. Patient  reported not starting his home prescription yet. Pharmacy consulted to dose Heparin for Afib  Goal of Therapy:  Heparin level 0.3-0.7 units/ml aPTT 66-102 seconds Monitor platelets by anticoagulation protocol: Yes   Plan:  Give 4000 units bolus x 1 Start heparin infusion at 1200 units/hr Baseline HL and aPTT Check anti-Xa level/aPTT in 8 hours and daily while on heparin Continue to monitor H&H and platelets   Darl Pikes, PharmD Clinical Pharmacist- Resident Pager: [phone]   Darl Pikes 04/19/2015,1:51 PM

## 2015-04-12 ENCOUNTER — Inpatient Hospital Stay (HOSPITAL_COMMUNITY): Payer: Medicare Other

## 2015-04-12 DIAGNOSIS — J9600 Acute respiratory failure, unspecified whether with hypoxia or hypercapnia: Secondary | ICD-10-CM

## 2015-04-12 DIAGNOSIS — J189 Pneumonia, unspecified organism: Secondary | ICD-10-CM | POA: Diagnosis present

## 2015-04-12 LAB — URINE CULTURE: CULTURE: NO GROWTH

## 2015-04-12 LAB — CBC
HCT: 27 % — ABNORMAL LOW (ref 39.0–52.0)
Hemoglobin: 8.2 g/dL — ABNORMAL LOW (ref 13.0–17.0)
MCH: 24.3 pg — AB (ref 26.0–34.0)
MCHC: 30.4 g/dL (ref 30.0–36.0)
MCV: 79.9 fL (ref 78.0–100.0)
PLATELETS: 365 10*3/uL (ref 150–400)
RBC: 3.38 MIL/uL — ABNORMAL LOW (ref 4.22–5.81)
RDW: 24.1 % — ABNORMAL HIGH (ref 11.5–15.5)
WBC: 4.4 10*3/uL (ref 4.0–10.5)

## 2015-04-12 LAB — APTT
APTT: 191 s — AB (ref 24–37)
aPTT: 196 seconds — ABNORMAL HIGH (ref 24–37)
aPTT: 65 seconds — ABNORMAL HIGH (ref 24–37)

## 2015-04-12 LAB — BASIC METABOLIC PANEL
Anion gap: 7 (ref 5–15)
BUN: 38 mg/dL — AB (ref 6–20)
CO2: 26 mmol/L (ref 22–32)
CREATININE: 2.21 mg/dL — AB (ref 0.61–1.24)
Calcium: 8.3 mg/dL — ABNORMAL LOW (ref 8.9–10.3)
Chloride: 105 mmol/L (ref 101–111)
GFR calc Af Amer: 31 mL/min — ABNORMAL LOW (ref >60)
GFR, EST NON AFRICAN AMERICAN: 26 mL/min — AB (ref >60)
GLUCOSE: 94 mg/dL (ref 65–99)
Potassium: 3.5 mmol/L (ref 3.5–5.1)
SODIUM: 138 mmol/L (ref 135–145)

## 2015-04-12 LAB — GLUCOSE, CAPILLARY
GLUCOSE-CAPILLARY: 101 mg/dL — AB (ref 65–99)
GLUCOSE-CAPILLARY: 105 mg/dL — AB (ref 65–99)
GLUCOSE-CAPILLARY: 153 mg/dL — AB (ref 65–99)
GLUCOSE-CAPILLARY: 88 mg/dL (ref 65–99)
GLUCOSE-CAPILLARY: 91 mg/dL (ref 65–99)
Glucose-Capillary: 116 mg/dL — ABNORMAL HIGH (ref 65–99)
Glucose-Capillary: 93 mg/dL (ref 65–99)

## 2015-04-12 LAB — HEPARIN LEVEL (UNFRACTIONATED): Heparin Unfractionated: >2.2 IU/mL — ABNORMAL HIGH (ref 0.30–0.70)

## 2015-04-12 LAB — PHOSPHORUS: Phosphorus: 3.8 mg/dL (ref 2.5–4.6)

## 2015-04-12 LAB — MRSA PCR SCREENING: MRSA BY PCR: NEGATIVE

## 2015-04-12 LAB — MAGNESIUM: MAGNESIUM: 2.3 mg/dL (ref 1.7–2.4)

## 2015-04-12 LAB — TROPONIN I: TROPONIN I: 0.24 ng/mL — AB (ref <0.031)

## 2015-04-12 MED ORDER — HEPARIN (PORCINE) IN NACL 100-0.45 UNIT/ML-% IJ SOLN
1250.0000 [IU]/h | INTRAMUSCULAR | Status: DC
  Administered 2015-04-12: 1200 [IU]/h via INTRAVENOUS
  Filled 2015-04-12 (×2): qty 250

## 2015-04-12 MED ORDER — SODIUM CHLORIDE 0.9 % IV SOLN
250.0000 mg | Freq: Four times a day (QID) | INTRAVENOUS | Status: DC
  Administered 2015-04-12 – 2015-04-18 (×25): 250 mg via INTRAVENOUS
  Filled 2015-04-12 (×28): qty 250

## 2015-04-12 MED ORDER — LINEZOLID 600 MG/300ML IV SOLN
600.0000 mg | Freq: Two times a day (BID) | INTRAVENOUS | Status: DC
  Administered 2015-04-12 – 2015-04-18 (×13): 600 mg via INTRAVENOUS
  Filled 2015-04-12 (×14): qty 300

## 2015-04-12 NOTE — Progress Notes (Signed)
ANTICOAGULATION CONSULT NOTE - Follow-up Consult  Pharmacy Consult for Heparin Indication: atrial fibrillation  Allergies  Allergen Reactions  . Aspirin     325mg  gives him shakes but low dose 81mg  is fine    Patient Measurements: Height: 5\' 11"  (180.3 cm) Weight: 184 lb 4.9 oz (83.6 kg) IBW/kg (Calculated) : 75.3 Heparin Dosing Weight: 83.6kg  Vital Signs: Temp: 97.6 F (36.4 C) (12/15 2342) Temp Source: Oral (12/15 2342) BP: 106/82 mmHg (12/15 2215) Pulse Rate: 70 (12/15 2215)  Labs:  Recent Labs  04/09/15 0504 04/10/15 0357 04/17/2015 1113 04/07/2015 1125 04/21/2015 1345 04/10/2015 1601 04/02/2015 1609 04/19/2015 2020 04/17/2015 2255  HGB 8.6* 8.5* 9.3* 11.9*  --   --   --   --   --   HCT 28.3* 28.6* 31.2* 35.0*  --   --   --   --   --   PLT 299 369 426*  --   --   --   --   --   --   APTT  --   --   --   --   --  35  --   --  54*  HEPARINUNFRC  --   --   --   --   --   --  2.00*  --  >2.20*  CREATININE 1.36* 1.51*  --  2.20* 2.53*  --   --   --   --   TROPONINI  --   --   --   --  0.26*  --   --  0.34*  --     Estimated Creatinine Clearance: 24.8 mL/min (by C-G formula based on Cr of 2.53).   Assessment: 68 yoM presenting to ED from care facility with decreased consciousness and low oxygen. Patient recently diagnosed with AF with RVR at [facility] during admission. Pt was started on apixaban. Last dose documented at [facility] 12/14 @ 0942. Patient reported not starting his home prescription yet. Pharmacy consulted to dose Heparin for Afib.  Heparin level >2.2 (apixaban still affecting). APTT 54 sec (subtherapeutic) on 1200 units/hr. No issues with line or bleeding reported per RN.  Goal of Therapy:  Heparin level 0.3-0.7 units/ml aPTT 66-102 seconds Monitor platelets by anticoagulation protocol: Yes   Plan:  Increase heparin infusion to 1400 units/hr F/u 8 hr aPTT  Sherlon Handing, PharmD, BCPS Clinical pharmacist, pager [phone]  04/12/2015,12:47 AM

## 2015-04-12 NOTE — Clinical Documentation Improvement (Signed)
Critical Care  Please clarify the acuity of the patient's suspected pulmonary edema and document findings in next progress note; not in BPA drop down box.   Acute Pulmonary Edema  Chronic Pulmonary Edema  Other  Clinically Undetermined  Please exercise your independent, professional judgment when responding. A specific answer is not anticipated or expected.  Thank You, Zoila Shutter RN, BSN, [place] [phone]; Cell: [phone]

## 2015-04-12 NOTE — Progress Notes (Signed)
ANTICOAGULATION CONSULT NOTE - Follow-up Consult  Pharmacy Consult for Heparin Indication: atrial fibrillation  Allergies  Allergen Reactions  . Aspirin     325mg  gives him shakes but low dose 81mg  is fine    Patient Measurements: Height: 5\' 11"  (180.3 cm) Weight: 185 lb 6.5 oz (84.1 kg) IBW/kg (Calculated) : 75.3 Heparin Dosing Weight: 83.6kg  Vital Signs: Temp: 97.6 F (36.4 C) (12/16 1537) Temp Source: Oral (12/16 1537) BP: 115/70 mmHg (12/16 2030) Pulse Rate: 58 (12/16 2030)  Labs:  Recent Labs  04/10/15 0357 04/24/2015 1113 04/13/2015 1125 03/28/2015 1345  04/05/2015 1609 04/21/2015 2020 04/20/2015 2255 04/12/15 0100 04/12/15 0530 04/12/15 1015 04/12/15 1145 04/12/15 2110  HGB 8.5* 9.3* 11.9*  --   --   --   --   --   --  8.2*  --   --   --   HCT 28.6* 31.2* 35.0*  --   --   --   --   --   --  27.0*  --   --   --   PLT 369 426*  --   --   --   --   --   --   --  365  --   --   --   APTT  --   --   --   --   < >  --   --  54*  --   --  191* 196* 65*  HEPARINUNFRC  --   --   --   --   --  2.00*  --  >2.20*  --   --   --   --  >2.20*  CREATININE 1.51*  --  2.20* 2.53*  --   --   --   --   --  2.21*  --   --   --   TROPONINI  --   --   --  0.26*  --   --  0.34*  --  0.24*  --   --   --   --   < > = values in this interval not displayed.  Estimated Creatinine Clearance: 28.4 mL/min (by C-G formula based on Cr of 2.21).   Assessment: 5 yoM previously on eliquis, now on IV heparin for afib. aPTT = 65 low end goal on 1200 units/hr, Heparin level 2.2, apparently Eliquis is still affecting the anti-Xa test. No bleeding noted per chart.   Goal of Therapy:  Heparin level 0.3-0.7 units/ml aPTT 66-102 seconds Monitor platelets by anticoagulation protocol: Yes   Plan:  Increase heparin rate slightly to 1250 units/hr Repeat HL/aPTT in AM Daily HL, aPTT, CBC Monitor for s/sx of bleeding  Maryanna Shape, PharmD, BCPS  Clinical Pharmacist  Pager: [phone]   04/12/2015 10:23  PM

## 2015-04-12 NOTE — H&P (Signed)
PULMONARY / CRITICAL CARE MEDICINE   Name: Jim Beck MRN: [id number] DOB: 03/30/1933    ADMISSION DATE:  04/07/2015   REFERRING MD:  EDP  CHIEF COMPLAINT:  Respiratory failure, sepsis   HISTORY OF PRESENT ILLNESS:   81yo male former smoker with hx COPD, HTN, remote hx polysubstance abuse, Hodgkin's lymphoma (initially dx 1979, recurrence 2011) s/p chemotherapy with last dose 03/27/15.  Just admitted 12/8-12/14 with HCAP, sepsis, discharged on ceftin.  Previously lived at home with his wife but d/c to SNF 12/14 r/t significant weakness, debilitation, malnutrition.  On am 12/15 he was found unresponsive at [facility] and brought to [facility] ED.  On arrival, O2 sats 64%, BP 84/61, lactate 9.5. Placed on NRB, given IVF and PCCM consulted for ICU admission.    Pt states he is feeling better since arrival.  Thinks that this was prompted by him taking off his nasal cannula to clean it and forgetting to put it back on.  He c/o productive cough but unable to fully expectorate due to weakness, also c/o malaise and mild SOB. Denies hemoptysis, fevers, chills.     SUBJECTIVE: no vent, no pressors, BIPAP used this am   VITAL SIGNS: BP 113/82 mmHg  Pulse 73  Temp(Src) 98.2 F (36.8 C) (Axillary)  Resp 24  Ht 5\' 11"  (1.803 m)  Wt 84.1 kg (185 lb 6.5 oz)  BMI 25.87 kg/m2  SpO2 98%  HEMODYNAMICS:    VENTILATOR SETTINGS: Vent Mode:  [-] BIPAP FiO2 (%):  [40 %-100 %] 40 % Set Rate:  [10 bmp] 10 bmp PEEP:  [5 cmH20] 5 cmH20  INTAKE / OUTPUT: I/O last 3 completed shifts: In: 2014.8 [I.V.:714.8; IV Piggyback:1300] Out: [id number] [Urine:667]  PHYSICAL EXAMINATION: General:  Frail, thin, chronically ill appearing male, on bipap but no distress Neuro:  Awakens, responds to questions HEENT:  Mm dry, NRB  Cardiovascular:  [id number]  Lungs: reduced Abdomen:  Soft, +bs  Musculoskeletal:  Warm and dry, 1+ BLE edema   LABS:  BMET  Recent Labs Lab 04/10/15 0357 04/03/2015 1125 03/28/2015 1345 04/12/15 0530   NA 134* 136 137 138  K 3.6 4.9 4.3 3.5  CL 99* 100* 103 105  CO2 29  --  23 26  BUN 27* 50* 36* 38*  CREATININE 1.51* 2.20* 2.53* 2.21*  GLUCOSE 119* 182* 217* 94    Electrolytes  Recent Labs Lab 04/10/15 0357 04/13/2015 1345 04/12/15 0530  CALCIUM 8.6* 8.0* 8.3*  MG  --   --  2.3  PHOS  --   --  3.8    CBC  Recent Labs Lab 04/10/15 0357 04/18/2015 1113 04/21/2015 1125 04/12/15 0530  WBC 3.5* 5.1  --  4.4  HGB 8.5* 9.3* 11.9* 8.2*  HCT 28.6* 31.2* 35.0* 27.0*  PLT 369 426*  --  365    Coag's  Recent Labs Lab 04/24/2015 1601 04/13/2015 2255  APTT 35 54*    Sepsis Markers  Recent Labs Lab 04/26/2015 1126 03/28/2015 1351 04/27/2015 2020  LATICACIDVEN 9.51* 3.56* 1.1    ABG  Recent Labs Lab 04/14/2015 1215  PHART 7.400  PCO2ART 38.8  PO2ART 116.0*    Liver Enzymes No results for input(s): AST, ALT, ALKPHOS, BILITOT, ALBUMIN in the last 168 hours.  Cardiac Enzymes  Recent Labs Lab 04/01/2015 1345 04/20/2015 2020 04/12/15 0100  TROPONINI 0.26* 0.34* 0.24*    Glucose  Recent Labs Lab 04/26/2015 1605 04/24/2015 1848 03/29/2015 2000 04/19/2015 2341 04/12/15 0338 04/12/15 0751  GLUCAP 173* 144* 139* 101*  93 88    Imaging Dg Chest Port 1 View  04/12/2015  CLINICAL DATA:  81 year old male with respiratory failure. Lymphoma. Initial encounter. EXAM: PORTABLE CHEST 1 VIEW COMPARISON:  04/24/2015 and earlier. FINDINGS: Portable AP semi upright view at 0526 hours. Increased confluence of patchy left upper lung opacity since 04/04/2015. Stable lung volumes. Stable cardiomegaly and mediastinal contours. Superimposed moderate size hiatal hernia. Right chest porta cath remains in place, currently accessed. No pneumothorax. Staple line in the left lung apex. No pleural effusion identified. IMPRESSION: 1. Radiographically progressed left upper lobe pneumonia since 04/04/2015. No pleural effusion identified. 2. Otherwise stable chest. Electronically Signed   By: Genevie Ann M.D.    On: 04/12/2015 07:31   Dg Chest Port 1 View  03/31/2015  CLINICAL DATA:  Low saturations this morning, shortness of breath, just released from hospital with pneumonia, history smoking, hypertension, COPD, MI, Hodgkin's disease EXAM: PORTABLE CHEST 1 VIEW COMPARISON:  Portable exam 1123 hours compared to 04/04/2015 FINDINGS: RIGHT jugular Port-A-Cath with tip projecting over SVC. Enlargement of cardiac silhouette. Large hiatal hernia. Mediastinal contours otherwise normal. Increased LEFT upper and LEFT lower lobe infiltrates consistent with pneumonia. Underlying emphysematous changes. No pleural effusion or pneumothorax. Prior resection at LEFT apex. No acute bony findings. IMPRESSION: COPD changes with increased LEFT lung infiltrates consistent with pneumonia. Enlargement of cardiac silhouette. Large hiatal hernia. Electronically Signed   By: Lavonia Dana M.D.   On: 04/03/2015 12:06   STUDIES:  12/15 CXR >> L sided infiltrate   CULTURES: Urine 12/15>>> BC x 2 12/15>>>  ANTIBIOTICS: Vancomycin 12/15>>> Zosyn 12/15>>>  SIGNIFICANT EVENTS: bipa this am for desat?  LINES/TUBES:   DISCUSSION: 81yo immunocompromised male r/t chemo therapy for Recurrence Hodkins lymphoma, just d/c 12/14, returns with PNA, sepsis.    ASSESSMENT / PLAN:  PULMONARY Acute hypoxic respiratory failure  HCAP worsening desite prior treatment Hx COPD  lymphangitic spread? P:   BIPAP may have been used for desat I would NOT use unless has increase WOB pcxr is worsening since last admission, infiltrates, see ID sats goal 90% Bders If has increase wob, would schedule NIMV Get slp  CARDIOVASCULAR Hx HTN  AFib  Sepsis  sCHF - EF 55% (Echo 04/05/15) P:  ASA  Hold home diltiazem, lopressor  Hold eliquis for now  Heparin gtt per pharmacy  Trend lactate resolving  RENAL AKI on CKD - baseline Scr ~ 1.1 Hypovolemic component P:   Keep pos balance Chem in am   GASTROINTESTINAL Protein calorie  malnutrition  P:   NPO for now  Dietitian consult  Needs SLP  HEMATOLOGIC Anemia - chronic  hodgkin's lymphoma - recurrent.  S/p chemo (last dose 11/30) P:  F/u cbc  Hep drip  INFECTIOUS HCAP (recent VOSYN, failed) Immunocompromised  P:   12/15 Vanc>>>12/16 12/15zosyn>>>12/16 12/16 Imipenem>>> 12/16 zyvox>>>  He was just on this regimen and is worse Add Imipenem, zyvox   pct low, but has prior lymphoma Sputum culture if able   ENDOCRINE Hyperglycemia   P:   SSI   NEUROLOGIC AMS - resolved.  R/t hypoxia. Now alert, appropriate.  P:   Supportive care   To triad, will stay as pulm consult To sdu until BIPAP not needed, he is DNR  Lavon Paganini. Titus Mould, MD, [place] Pgr: [facility] [place]

## 2015-04-12 NOTE — Progress Notes (Signed)
ANTICOAGULATION CONSULT NOTE - Follow-up Consult  Pharmacy Consult for Heparin Indication: atrial fibrillation  Allergies  Allergen Reactions  . Aspirin     325mg  gives him shakes but low dose 81mg  is fine    Patient Measurements: Height: 5\' 11"  (180.3 cm) Weight: 185 lb 6.5 oz (84.1 kg) IBW/kg (Calculated) : 75.3 Heparin Dosing Weight: 83.6kg  Vital Signs: Temp: 98.2 F (36.8 C) (12/16 0814) Temp Source: Axillary (12/16 0814) BP: 112/81 mmHg (12/16 0900) Pulse Rate: 61 (12/16 1000)  Labs:  Recent Labs  04/10/15 0357 04/15/2015 1113 04/21/2015 1125 04/04/2015 1345 04/10/2015 1601 03/29/2015 1609 04/18/2015 2020 04/03/2015 2255 04/12/15 0100 04/12/15 0530 04/12/15 1015  HGB 8.5* 9.3* 11.9*  --   --   --   --   --   --  8.2*  --   HCT 28.6* 31.2* 35.0*  --   --   --   --   --   --  27.0*  --   PLT 369 426*  --   --   --   --   --   --   --  365  --   APTT  --   --   --   --  35  --   --  54*  --   --  191*  HEPARINUNFRC  --   --   --   --   --  2.00*  --  >2.20*  --   --   --   CREATININE 1.51*  --  2.20* 2.53*  --   --   --   --   --  2.21*  --   TROPONINI  --   --   --  0.26*  --   --  0.34*  --  0.24*  --   --     Estimated Creatinine Clearance: 28.4 mL/min (by C-G formula based on Cr of 2.21).   Assessment: 26 yoM presenting to ED from care facility with decreased consciousness and low oxygen. Patient recently diagnosed with AF with RVR at [facility] during admission. Pt was started on apixaban. Last dose documented at [facility] 12/14 @ 0942. Patient reported not starting his home prescription yet. Pharmacy consulted to dose Heparin for Afib.  Initial labs for heparin drip showed an aPTT of 52 on 1200 units/hr. This is subtherapeutic so dose was increased to 1400 units/hr. Repeat aPTT was 191. This was immediately repeated to confirm and it was higher at 196 (both drawn at sites distal to infusion)  No bleeding per RN  Goal of Therapy:  Heparin level 0.3-0.7 units/ml aPTT 66-102  seconds Monitor platelets by anticoagulation protocol: Yes   Plan:  Stop heparin gtt x 1 hr Restart heparin @ 1400 at a dose of 1200 units/hr Repeat HL/aPTT @ 2100 Daily HL, aPTT, CBC Monitor for s/sx of bleeding  Levester Fresh, PharmD, BCPS, [facility] Clinical Pharmacist Pager [phone] 04/12/2015 12:56 PM

## 2015-04-12 NOTE — Clinical Documentation Improvement (Signed)
Critical Care  Can the diagnosis of Protein Calorie Malnutrition be further specified? Please document response in progress note; not BPA drop down box. Thank you for your assistance.   Document Severity - Severe(third degree), Moderate (second degree), Mild (first degree)  Other condition  Unable to clinically determine  Document any associated diagnoses/conditions  Please exercise your independent, professional judgment when responding. A specific answer is not anticipated or expected.  Thank You, Zoila Shutter RN, BSN, [place] [phone]; Cell: [phone]

## 2015-04-12 NOTE — Evaluation (Signed)
Clinical/Bedside Swallow Evaluation Patient Details  Name: Jim Beck MRN: [id number] Date of Birth: [birth year]  Today's Date: 04/12/2015 Time: SLP Start Time (ACUTE ONLY): 29 SLP Stop Time (ACUTE ONLY): 1414 SLP Time Calculation (min) (ACUTE ONLY): 12 min  Past Medical History:  Past Medical History  Diagnosis Date  . COPD (chronic obstructive pulmonary disease) ([place])   . Tobacco abuse   . Hypertension   . Hyperlipemia   . GERD (gastroesophageal reflux disease)     large hiatal hernia and stricture  . Hx of blood diseases     bullous blood disease  . PUD (peptic ulcer disease)   . BPH (benign prostatic hypertrophy)   . History of alcohol abuse   . Heart murmur   . Diverticulosis   . External hemorrhoids   . Arthritis   . Myocardial infarction ([place])   . Substance abuse   . Personal history of colonic polyps - adenomas 11/30/2013    12 mm and diminutive adenoma - no recall planned (age)  . Shortness of breath dyspnea     with exertion  . Depression   . Constipation   . History of hiatal hernia     per notes  . Hodgkin lymphoma ([place]) 07/2009    head and neck and throat   Past Surgical History:  Past Surgical History  Procedure Laterality Date  . Inguinal hernia repair      bilateral  . Upper gastrointestinal endoscopy  03/08/2002    esophageal stricture, hiatal hernia  . Colonoscopy  03/08/2002    diverticulosis, external hemorrhoids  . Lung surgery  2011  . Esophageal dilation      x2  . Throat cancer    . Lymph node biopsy Left 09/26/2014    Procedure: EXCISIONAL BIOPSY LEFT AXILLARY LYMPH NODE;  Surgeon: Coralie Keens, MD;  Location: [facility];  Service: General;  Laterality: Left;   HPI:  81yo male former smoker with hx COPD, HTN, remote hx polysubstance abuse, Hodgkin's lymphoma (initially dx 1979, recurrence 2011) s/p chemotherapy with last dose 03/27/15. Just admitted 12/8-12/14 with HCAP, sepsis, discharged on ceftin. Previously lived at home with his  wife but d/c to SNF 12/14 r/t significant weakness, debilitation, malnutrition. On am 12/15 he was found unresponsive at [facility] and brought to [facility] ED.   Assessment / Plan / Recommendation Clinical Impression  Pt has immediate coughing following several consecutive cup sips of thin liquid, but with Min cues from therapist for more appropraite pacing, no further s/s of aspiration are observed. Pt does have slow mastication as he is edentulous, and he voices his preference for softer foods. Will initiate Dys 2 diet and thin liquids. Will benefit from full supervision to assist with pacing initially.    Aspiration Risk  Mild aspiration risk    Diet Recommendation  Dys 2 diet, thin liquids Full supervision   Medication Administration: Whole meds with puree    Other  Recommendations Oral Care Recommendations: Oral care BID   Follow up Recommendations   (tba)    Frequency and Duration min 2x/week  1 week       Prognosis        Swallow Study   General HPI: 81yo male former smoker with hx COPD, HTN, remote hx polysubstance abuse, Hodgkin's lymphoma (initially dx 1979, recurrence 2011) s/p chemotherapy with last dose 03/27/15. Just admitted 12/8-12/14 with HCAP, sepsis, discharged on ceftin. Previously lived at home with his wife but d/c to SNF 12/14 r/t significant weakness, debilitation, malnutrition. On  am 12/15 he was found unresponsive at [facility] and brought to [facility] ED. Type of Study: Bedside Swallow Evaluation Previous Swallow Assessment: none in chart Diet Prior to this Study: NPO Temperature Spikes Noted: Yes (101.2) Respiratory Status: Nasal cannula History of Recent Intubation: No Behavior/Cognition: Alert;Cooperative;Pleasant mood Oral Cavity Assessment: Dry Oral Care Completed by SLP: No Oral Cavity - Dentition: Edentulous Vision: Functional for self-feeding Self-Feeding Abilities: Able to feed self Patient Positioning: Upright in bed Baseline Vocal Quality: Normal Volitional  Swallow: Able to elicit    Oral/Motor/Sensory Function Overall Oral Motor/Sensory Function: Within functional limits   Ice Chips Ice chips: Not tested   Thin Liquid Thin Liquid: Impaired Presentation: Cup;Self Fed;Straw Pharyngeal  Phase Impairments: Suspected delayed Swallow;Cough - Immediate    Nectar Thick Nectar Thick Liquid: Not tested   Honey Thick Honey Thick Liquid: Not tested   Puree Puree: Within functional limits Presentation: Self Fed;Spoon   Solid Solid:  (functional given lack of dentition)      Jim Beck, M.A. CCC-SLP [phone]  Jim Beck 04/12/2015,2:22 PM

## 2015-04-13 ENCOUNTER — Inpatient Hospital Stay (HOSPITAL_COMMUNITY): Payer: Medicare Other

## 2015-04-13 DIAGNOSIS — I272 Other secondary pulmonary hypertension: Secondary | ICD-10-CM

## 2015-04-13 DIAGNOSIS — N179 Acute kidney failure, unspecified: Secondary | ICD-10-CM | POA: Diagnosis present

## 2015-04-13 DIAGNOSIS — I420 Dilated cardiomyopathy: Secondary | ICD-10-CM | POA: Diagnosis present

## 2015-04-13 DIAGNOSIS — N189 Chronic kidney disease, unspecified: Secondary | ICD-10-CM

## 2015-04-13 DIAGNOSIS — I10 Essential (primary) hypertension: Secondary | ICD-10-CM | POA: Diagnosis present

## 2015-04-13 DIAGNOSIS — C8197 Hodgkin lymphoma, unspecified, spleen: Secondary | ICD-10-CM | POA: Diagnosis present

## 2015-04-13 DIAGNOSIS — J9601 Acute respiratory failure with hypoxia: Secondary | ICD-10-CM | POA: Diagnosis present

## 2015-04-13 LAB — BASIC METABOLIC PANEL
ANION GAP: 10 (ref 5–15)
BUN: 38 mg/dL — ABNORMAL HIGH (ref 6–20)
CHLORIDE: 107 mmol/L (ref 101–111)
CO2: 24 mmol/L (ref 22–32)
Calcium: 8.1 mg/dL — ABNORMAL LOW (ref 8.9–10.3)
Creatinine, Ser: 1.87 mg/dL — ABNORMAL HIGH (ref 0.61–1.24)
GFR calc Af Amer: 37 mL/min — ABNORMAL LOW (ref >60)
GFR, EST NON AFRICAN AMERICAN: 32 mL/min — AB (ref >60)
GLUCOSE: 117 mg/dL — AB (ref 65–99)
POTASSIUM: 3.4 mmol/L — AB (ref 3.5–5.1)
Sodium: 141 mmol/L (ref 135–145)

## 2015-04-13 LAB — CBC
HCT: 26 % — ABNORMAL LOW (ref 39.0–52.0)
HEMOGLOBIN: 8 g/dL — AB (ref 13.0–17.0)
MCH: 24.7 pg — ABNORMAL LOW (ref 26.0–34.0)
MCHC: 30.8 g/dL (ref 30.0–36.0)
MCV: 80.2 fL (ref 78.0–100.0)
Platelets: 345 10*3/uL (ref 150–400)
RBC: 3.24 MIL/uL — AB (ref 4.22–5.81)
RDW: 24.2 % — ABNORMAL HIGH (ref 11.5–15.5)
WBC: 5.8 10*3/uL (ref 4.0–10.5)

## 2015-04-13 LAB — APTT
APTT: 152 s — AB (ref 24–37)
APTT: 102 s — AB (ref 24–37)

## 2015-04-13 LAB — SAVE SMEAR

## 2015-04-13 LAB — GLUCOSE, CAPILLARY
GLUCOSE-CAPILLARY: 113 mg/dL — AB (ref 65–99)
GLUCOSE-CAPILLARY: 182 mg/dL — AB (ref 65–99)
GLUCOSE-CAPILLARY: 147 mg/dL — AB (ref 65–99)
Glucose-Capillary: 106 mg/dL — ABNORMAL HIGH (ref 65–99)
Glucose-Capillary: 154 mg/dL — ABNORMAL HIGH (ref 65–99)
Glucose-Capillary: 151 mg/dL — ABNORMAL HIGH (ref 65–99)

## 2015-04-13 LAB — MAGNESIUM: Magnesium: 2.3 mg/dL (ref 1.7–2.4)

## 2015-04-13 LAB — HEPARIN LEVEL (UNFRACTIONATED): HEPARIN UNFRACTIONATED: 2.12 [IU]/mL — AB (ref 0.30–0.70)

## 2015-04-13 MED ORDER — HEPARIN (PORCINE) IN NACL 100-0.45 UNIT/ML-% IJ SOLN
1100.0000 [IU]/h | INTRAMUSCULAR | Status: DC
  Administered 2015-04-13: 1100 [IU]/h via INTRAVENOUS
  Filled 2015-04-13: qty 250

## 2015-04-13 MED ORDER — HEPARIN (PORCINE) IN NACL 100-0.45 UNIT/ML-% IJ SOLN
900.0000 [IU]/h | INTRAMUSCULAR | Status: DC
  Administered 2015-04-14 – 2015-04-16 (×3): 1050 [IU]/h via INTRAVENOUS
  Administered 2015-04-17: 950 [IU]/h via INTRAVENOUS
  Administered 2015-04-18 – 2015-04-19 (×2): 900 [IU]/h via INTRAVENOUS
  Filled 2015-04-13 (×6): qty 250

## 2015-04-13 MED ORDER — MORPHINE SULFATE (PF) 2 MG/ML IV SOLN
INTRAVENOUS | Status: AC
  Filled 2015-04-13: qty 1

## 2015-04-13 MED ORDER — MORPHINE SULFATE (PF) 2 MG/ML IV SOLN
1.0000 mg | INTRAVENOUS | Status: DC | PRN
  Administered 2015-04-13 – 2015-04-18 (×12): 2 mg via INTRAVENOUS
  Administered 2015-04-19: 1 mg via INTRAVENOUS
  Administered 2015-04-20 (×3): 2 mg via INTRAVENOUS
  Filled 2015-04-13 (×16): qty 1

## 2015-04-13 NOTE — Progress Notes (Signed)
Speech Language Pathology Treatment: Dysphagia  Patient Details Name: Jim Beck MRN: [id number] DOB: 1932-11-17 Today's Date: 04/13/2015 Time:  -     Assessment / Plan / Recommendation Clinical Impression  SLP present during lunch meal to assess tolerance of current dysphagia 2 (chopped) and thin liquid diet. Pt with vitals fluctuating prior to and during PO consumption with oxygen saturation in the 80s, increased RR, and persistent tachycardia. Reinforced pacing strategies for energy and oxygen conservation. Pt with dentures at bedside and declined use during meal. Noted prolonged mastication of chopped consistency  and suspected delay in swallow intiation. +No overt signs or symptoms of aspiration. Chest x ray continues to exhibit bilateral infiltrates left greater than right. Continue dysphagia 2 (chopped) and thin liquid diet. Additional ST dysphagia  follow up recommended.    HPI HPI: 81yo male former smoker with hx COPD, HTN, remote hx polysubstance abuse, Hodgkin's lymphoma (initially dx 1979, recurrence 2011) s/p chemotherapy with last dose 03/27/15. Just admitted 12/8-12/14 with HCAP, sepsis, discharged on ceftin. Previously lived at home with his wife but d/c to SNF 12/14 r/t significant weakness, debilitation, malnutrition. On am 12/15 he was found unresponsive at [facility] and brought to [facility] ED.      SLP Plan  Continue with current plan of care     Recommendations  Diet recommendations: Dysphagia 2 (fine chop);Thin liquid              Plan: Continue with current plan of care  Jim Chaco MA, [place] Speech Language Pathologist    Jim Beck 04/13/2015, 2:16 PM

## 2015-04-13 NOTE — Progress Notes (Signed)
ANTICOAGULATION CONSULT NOTE - Follow-up Consult  Pharmacy Consult for Heparin Indication: atrial fibrillation  Allergies  Allergen Reactions  . Aspirin     325mg  gives him shakes but low dose 81mg  is fine    Patient Measurements: Height: 5\' 11"  (180.3 cm) Weight: 182 lb 8.7 oz (82.8 kg) IBW/kg (Calculated) : 75.3 Heparin Dosing Weight: 83.6kg  Vital Signs: Temp: 98.6 F (37 C) (12/17 0811) Temp Source: Oral (12/17 0811) BP: 121/76 mmHg (12/17 0400) Pulse Rate: 76 (12/17 0400)  Labs:  Recent Labs  03/29/2015 1113  04/20/2015 1125 04/03/2015 1345  03/28/2015 2020 04/25/2015 2255 04/12/15 0100 04/12/15 0530  04/12/15 1145 04/12/15 2110 04/13/15 0415 04/13/15 0911  HGB 9.3*  --  11.9*  --   --   --   --   --  8.2*  --   --   --  8.0*  --   HCT 31.2*  --  35.0*  --   --   --   --   --  27.0*  --   --   --  26.0*  --   PLT 426*  --   --   --   --   --   --   --  365  --   --   --  345  --   APTT  --   --   --   --   < >  --  54*  --   --   < > 196* 65*  --  152*  HEPARINUNFRC  --   --   --   --   < >  --  >2.20*  --   --   --   --  >2.20*  --  2.12*  CREATININE  --   < > 2.20* 2.53*  --   --   --   --  2.21*  --   --   --  1.87*  --   TROPONINI  --   --   --  0.26*  --  0.34*  --  0.24*  --   --   --   --   --   --   < > = values in this interval not displayed.  Estimated Creatinine Clearance: 33.6 mL/min (by C-G formula based on Cr of 1.87).  . sodium chloride 50 mL/hr at 04/12/15 1933  . heparin      Assessment: 77 yoM previously on eliquis, now on IV heparin for afib. PTT supratherapeutic at 152, level drawn appropriately from site away from heparin infusion.  Heparin level 2.2, apparently Eliquis is still affecting the anti-Xa test. No bleeding noted per chart.  Goal of Therapy:  Heparin level 0.3-0.7 units/ml aPTT 66-102 seconds Monitor platelets by anticoagulation protocol: Yes   Plan:  Decrease IV heparin to 1100 units/hr. Recheck PTT in 6 hrs. Daily HL, aPTT,  CBC Monitor for s/sx of bleeding  Uvaldo Rising, BCPS  Clinical Pharmacist Pager [phone]  04/13/2015 11:42 AM

## 2015-04-13 NOTE — Progress Notes (Signed)
ANTICOAGULATION CONSULT NOTE - Follow-up Consult  Pharmacy Consult for Heparin Indication: atrial fibrillation  Allergies  Allergen Reactions  . Aspirin     325mg  gives him shakes but low dose 81mg  is fine    Patient Measurements: Height: 5\' 11"  (180.3 cm) Weight: 182 lb 8.7 oz (82.8 kg) IBW/kg (Calculated) : 75.3 Heparin Dosing Weight: 83.6kg  Vital Signs: Temp: 97.9 F (36.6 C) (12/17 1628) Temp Source: Oral (12/17 1628) BP: 122/90 mmHg (12/17 1800) Pulse Rate: 109 (12/17 1821)  Labs:  Recent Labs  04/01/2015 1113  04/10/2015 1125 04/08/2015 1345  04/13/2015 2020 04/13/2015 2255 04/12/15 0100 04/12/15 0530  04/12/15 2110 04/13/15 0415 04/13/15 0911 04/13/15 1844  HGB 9.3*  --  11.9*  --   --   --   --   --  8.2*  --   --  8.0*  --   --   HCT 31.2*  --  35.0*  --   --   --   --   --  27.0*  --   --  26.0*  --   --   PLT 426*  --   --   --   --   --   --   --  365  --   --  345  --   --   APTT  --   --   --   --   < >  --  54*  --   --   < > 65*  --  152* 102*  HEPARINUNFRC  --   --   --   --   < >  --  >2.20*  --   --   --  >2.20*  --  2.12*  --   CREATININE  --   < > 2.20* 2.53*  --   --   --   --  2.21*  --   --  1.87*  --   --   TROPONINI  --   --   --  0.26*  --  0.34*  --  0.24*  --   --   --   --   --   --   < > = values in this interval not displayed.  Estimated Creatinine Clearance: 33.6 mL/min (by C-G formula based on Cr of 1.87).  . sodium chloride 50 mL/hr at 04/12/15 1933  . heparin      Assessment: 36 yoM previously on eliquis, now on IV heparin for afib. PTT supratherapeutic at 152, level drawn appropriately from site away from heparin infusion.  Heparin level 2.2, apparently Eliquis is still affecting the anti-Xa test. No bleeding noted per chart.  PTT now therapeutic on heparin 1100 units/hr, but still remains at upper end of goal range.  No bleeding or complications noted per chart notes.  Goal of Therapy:  Heparin level 0.3-0.7 units/ml aPTT  66-102 seconds Monitor platelets by anticoagulation protocol: Yes   Plan:  Decrease IV heparin to 1050 units/hr. Daily HL, aPTT, CBC Monitor for s/sx of bleeding  Uvaldo Rising, BCPS  Clinical Pharmacist Pager [phone]  04/13/2015 7:59 PM

## 2015-04-13 NOTE — Progress Notes (Signed)
[place] TEAM 1 - Stepdown/ICU TEAM Progress Note  Jim Beck [id number] DOB: [birth year] DOA: 04/03/2015 PCP: Kandice Hams, MD  Admit HPI / Brief Narrative: 81yo BM PMHx Depression, former Smoker, COPD, HTN, MI, HLD, remote hx Polysubstance Abuse, Diverticulosis, Colonic Polyps-Adenomatous, Hodgkin's lymphoma (initially dx 1979, recurrence 2011) S/P Chemotherapy with last dose 03/27/15. Just admitted 12/8-12/14 with HCAP, sepsis, discharged on ceftin. Previously lived at home with his wife but d/c to SNF 12/14 r/t significant weakness, debilitation, malnutrition.   On am 12/15 he was found unresponsive at [facility] and brought to [facility] ED. On arrival, O2 sats 64%, BP 84/61, lactate 9.5. Placed on NRB, given IVF and PCCM consulted for ICU admission.   Pt states he is feeling better since arrival. Thinks that this was prompted by him taking off his nasal cannula to clean it and forgetting to put it back on. He c/o productive cough but unable to fully expectorate due to weakness, also c/o malaise and mild SOB. Denies hemoptysis, fevers, chills.    HPI/Subjective: 12/17 A/O 4, positive acute on chronic respiratory failure (currently on [place] at 5L), able to speak in 6-7 word sentences.  Assessment/Plan: Acute hypoxic respiratory failure/ Sepsis/HCAP worsening desite prior treatment/COPD exacerbation -Titrate O2 to maintain SPO2 89 and 93% -Use BiPAP to help decrease patient's WOB -PCXR worsening since last admission, infiltrates, see ID -Continue current empiric antibiotics. - HTN  -Currently controlled normal medication monitor  Paroxysmal AFib  -Currently in sinus tachycardia   Dilated cardiomyopathy - EF 55% (Echo 04/05/15) -Strict in and out since admission +3.3 L -Daily weight;              12/17 had weight= 82.8 kg -Normal CHF medication on hold secondary to hypotension  Pulmonary hypertension  Acute on chronic renal failure ( baseline Scr ~ 1.1) -Hypovolemic  component -See dilated cardiomyopathy  Protein calorie malnutrition  -Dysphagia 2 diet fluid thin    Anemia chronic disease    Hodgkin's lymphoma  - recurrent? S/p chemo (last dose 11/30) -PBS pending pathology read -Contact oncology in A.m.  Hyperglycemia  -Sensitive SSI   Goals of care -Patient with MSO F, acute on chronic respiratory failure, with possible recurrent non-Hodgkin's lymphoma. Discuss short-term goals of care vs [place]-term goals care    Code Status: DO NOT RESUSCITATE Family Communication: no family present at time of exam Disposition Plan: Recommend palliative care consult    Consultants: Dr.Daniel Lily Kocher PCCM   Procedure/Significant Events: 12/9 echocardiogram;- LVEF= 55% to 60%. - Ventricular septum: contour showed diastolic flattening and systolic flattening. - Right ventricle: moderately to severelydilated. The moderator band was prominent.  - Right atrium:  mildly to moderately dilated. - Tricuspid valve: moderateregurgitation. - PA peak pressure: 54 mm Hg (S).   Culture 12/15 blood right chest Port-A-Cath NGTD 12/15 blood right AC NGTD 12/15 urine negative final 12/15 MRSA by PCR negative   Antibiotics: 12/15 Vanc>>>12/16 12/15zosyn>>>12/16 12/16 Imipenem>>> 12/16 zyvox>>>   DVT prophylaxis: Heparin drip   Devices    LINES / TUBES:      Continuous Infusions: . sodium chloride 50 mL/hr at 04/12/15 1933  . heparin 1,100 Units/hr (04/13/15 1340)    Objective: VITAL SIGNS: Temp: 97.9 F (36.6 C) (12/17 1628) Temp Source: Oral (12/17 1628) BP: 122/90 mmHg (12/17 1800) Pulse Rate: 109 (12/17 1821) SPO2; FIO2:  Vent Mode: [-] BIPAP FiO2 (%): [40 %-100 %] 40 % Set Rate: [10 bmp] 10 bmp PEEP: [5 cmH20] 5 cmH20    Intake/Output Summary (Last  24 hours) at 04/13/15 1901 Last data filed at 04/13/15 1800  Gross per 24 hour  Intake 2649.12 ml  Output   1650 ml  Net 999.12 ml     Exam: General:  A/O 4, cachectic, increased WOB, positive acute and chronic respiratory distress Eyes: Negative headache,negative scleral hemorrhage ENT: Negative Runny nose, negative ear pain, negative gingival bleeding, Neck:  Negative scars, masses, torticollis, lymphadenopathy, JVD Lungs: tachypneic, diffuse coarse breath sounds with expiratory wheezing, negative crackles  Cardiovascular: Tachycardic, Regular rate and rhythm without murmur gallop or rub normal S1 and S2 Abdomen:negative abdominal pain, nondistended, positive soft, bowel sounds, no rebound, no ascites, no appreciable mass Extremities: No significant cyanosis, clubbing, or edema bilateral lower extremities Psychiatric:  Negative depression, negative anxiety, negative fatigue, negative mania  Neurologic:  Cranial nerves II through XII intact, tongue/uvula midline, all extremities muscle strength 5/5, sensation intact throughout, negative dysarthria, negative expressive aphasia, negative receptive aphasia.   Data Reviewed: Basic Metabolic Panel:  Recent Labs Lab 04/09/15 0504 04/10/15 0357 04/08/2015 1125 04/08/2015 1345 04/12/15 0530 04/13/15 0415  NA 137 134* 136 137 138 141  K 3.9 3.6 4.9 4.3 3.5 3.4*  CL 103 99* 100* 103 105 107  CO2 27 29  --  23 26 24   GLUCOSE 108* 119* 182* 217* 94 117*  BUN 27* 27* 50* 36* 38* 38*  CREATININE 1.36* 1.51* 2.20* 2.53* 2.21* 1.87*  CALCIUM 8.9 8.6*  --  8.0* 8.3* 8.1*  MG  --   --   --   --  2.3 2.3  PHOS  --   --   --   --  3.8  --    Liver Function Tests: No results for input(s): AST, ALT, ALKPHOS, BILITOT, PROT, ALBUMIN in the last 168 hours. No results for input(s): LIPASE, AMYLASE in the last 168 hours. No results for input(s): AMMONIA in the last 168 hours. CBC:  Recent Labs Lab 04/09/15 0504 04/10/15 0357 04/05/2015 1113 04/26/2015 1125 04/12/15 0530 04/13/15 0415  WBC 3.3* 3.5* 5.1  --  4.4 5.8  NEUTROABS  --   --  2.6  --   --   --   HGB 8.6* 8.5* 9.3* 11.9* 8.2* 8.0*  HCT  28.3* 28.6* 31.2* 35.0* 27.0* 26.0*  MCV 79.3 79.0 81.7  --  79.9 80.2  PLT 299 369 426*  --  365 345   Cardiac Enzymes:  Recent Labs Lab 04/27/2015 1345 03/31/2015 2020 04/12/15 0100  TROPONINI 0.26* 0.34* 0.24*   BNP (last 3 results)  Recent Labs  04/20/2015 1113  BNP 523.3*    ProBNP (last 3 results) No results for input(s): PROBNP in the last 8760 hours.  CBG:  Recent Labs Lab 04/12/15 2344 04/13/15 0349 04/13/15 0809 04/13/15 1211 04/13/15 1630  GLUCAP 116* 113* 106* 182* 154*    Recent Results (from the past 240 hour(s))  Culture, blood (x 2)     Status: None   Collection Time: 04/04/15  1:30 AM  Result Value Ref Range Status   Specimen Description BLOOD RIGHT [facility]  Final   Special Requests IN PEDIATRIC BOTTLE [place]  Final   Culture   Final    NO GROWTH 5 DAYS Performed at [facility]    Report Status 04/10/2015 FINAL  Final  Culture, blood (x 2)     Status: None   Collection Time: 04/04/15  1:44 AM  Result Value Ref Range Status   Specimen Description BLOOD RIGHT HAND  Final   Special  Requests BOTTLES DRAWN AEROBIC ONLY 5CC  Final   Culture   Final    NO GROWTH 5 DAYS Performed at [facility]    Report Status 04/10/2015 FINAL  Final  Urine culture     Status: None   Collection Time: 04/04/15  8:42 PM  Result Value Ref Range Status   Specimen Description URINE, CLEAN CATCH  Final   Special Requests NONE  Final   Culture   Final    3,000 COLONIES/mL INSIGNIFICANT GROWTH Performed at [facility]    Report Status 04/06/2015 FINAL  Final  Culture, sputum-assessment     Status: None   Collection Time: 04/05/15  6:31 AM  Result Value Ref Range Status   Specimen Description SPUTUM  Final   Special Requests NONE  Final   Sputum evaluation   Final    MICROSCOPIC FINDINGS SUGGEST THAT THIS SPECIMEN IS NOT REPRESENTATIVE OF LOWER RESPIRATORY SECRETIONS. PLEASE RECOLLECT. NOTIFIED [name] AT [id number] ON 12.9.16 BY SHUEA    Report Status  04/05/2015 FINAL  Final  Culture, expectorated sputum-assessment     Status: None   Collection Time: 04/05/15  5:04 PM  Result Value Ref Range Status   Specimen Description SPUTUM  Final   Special Requests Immunocompromised  Final   Sputum evaluation   Final    THIS SPECIMEN IS ACCEPTABLE. RESPIRATORY CULTURE REPORT TO FOLLOW.   Report Status 04/05/2015 FINAL  Final  Culture, respiratory (NON-Expectorated)     Status: None   Collection Time: 04/05/15  5:04 PM  Result Value Ref Range Status   Specimen Description SPUTUM  Final   Special Requests NONE  Final   Gram Stain   Final    NO WBC SEEN FEW SQUAMOUS EPITHELIAL CELLS PRESENT FEW GRAM NEGATIVE RODS Performed at [facility]    Culture   Final    NORMAL OROPHARYNGEAL FLORA Performed at [facility]    Report Status 04/08/2015 FINAL  Final  Culture, blood (Routine X 2) w Reflex to ID Panel     Status: None (Preliminary result)   Collection Time: 04/06/2015 11:40 AM  Result Value Ref Range Status   Specimen Description BLOOD RIGHT CHEST PORTA CATH  Final   Special Requests BOTTLES DRAWN AEROBIC AND ANAEROBIC 5CC  Final   Culture NO GROWTH 2 DAYS  Final   Report Status PENDING  Incomplete  Culture, blood (Routine X 2) w Reflex to ID Panel     Status: None (Preliminary result)   Collection Time: 03/30/2015 11:45 AM  Result Value Ref Range Status   Specimen Description BLOOD RIGHT ANTECUBITAL  Final   Special Requests BOTTLES DRAWN AEROBIC AND ANAEROBIC 10CC  Final   Culture NO GROWTH 2 DAYS  Final   Report Status PENDING  Incomplete  Urine culture     Status: None   Collection Time: 04/23/2015 12:00 PM  Result Value Ref Range Status   Specimen Description URINE, CATHETERIZED  Final   Special Requests NONE  Final   Culture NO GROWTH 1 DAY  Final   Report Status 04/12/2015 FINAL  Final  MRSA PCR Screening     Status: None   Collection Time: 04/05/2015  9:25 PM  Result Value Ref Range Status   MRSA by PCR NEGATIVE  NEGATIVE Final    Comment:        The GeneXpert MRSA Assay (FDA approved for NASAL specimens only), is one component of a comprehensive MRSA colonization surveillance program. It is not  intended to diagnose MRSA infection nor to guide or monitor treatment for MRSA infections.      Studies:  Recent x-ray studies have been reviewed in detail by the Attending Physician  Scheduled Meds:  Scheduled Meds: . acetaminophen  1,000 mg Oral Once  . imipenem-cilastatin  250 mg Intravenous Q6H  . insulin aspart  0-9 Units Subcutaneous 6 times per day  . linezolid (ZYVOX) IV  600 mg Intravenous Q12H    Time spent on care of this patient: 40 mins   Kieran Arreguin, Geraldo Docker , MD  [facility] Office  [phone] Pager [phone]  On-Call/Text Page:      [email or web address]      password [email or web address]  If 7PM-7AM, please contact night-coverage [email or web address] Password [email or web address] 04/13/2015, 7:01 PM   LOS: 2 days   Care during the described time interval was provided by me .  I have reviewed this patient's available data, including medical history, events of note, physical examination, and all test results as part of my evaluation. I have personally reviewed and interpreted all radiology studies.   Dia Crawford, MD [phone] Pager

## 2015-04-14 DIAGNOSIS — I480 Paroxysmal atrial fibrillation: Secondary | ICD-10-CM | POA: Diagnosis present

## 2015-04-14 DIAGNOSIS — C8199 Hodgkin lymphoma, unspecified, extranodal and solid organ sites: Secondary | ICD-10-CM | POA: Diagnosis present

## 2015-04-14 DIAGNOSIS — Z515 Encounter for palliative care: Secondary | ICD-10-CM | POA: Diagnosis present

## 2015-04-14 DIAGNOSIS — C8177 Other classical Hodgkin lymphoma, spleen: Secondary | ICD-10-CM

## 2015-04-14 DIAGNOSIS — E876 Hypokalemia: Secondary | ICD-10-CM | POA: Diagnosis present

## 2015-04-14 LAB — CBC
HCT: 27 % — ABNORMAL LOW (ref 39.0–52.0)
HEMOGLOBIN: 8.1 g/dL — AB (ref 13.0–17.0)
MCH: 24.2 pg — AB (ref 26.0–34.0)
MCHC: 30 g/dL (ref 30.0–36.0)
MCV: 80.6 fL (ref 78.0–100.0)
Platelets: 343 10*3/uL (ref 150–400)
RBC: 3.35 MIL/uL — ABNORMAL LOW (ref 4.22–5.81)
RDW: 24.6 % — AB (ref 11.5–15.5)
WBC: 7.2 10*3/uL (ref 4.0–10.5)

## 2015-04-14 LAB — COMPREHENSIVE METABOLIC PANEL
ALK PHOS: 86 U/L (ref 38–126)
ALT: 432 U/L — ABNORMAL HIGH (ref 17–63)
ANION GAP: 7 (ref 5–15)
AST: 144 U/L — ABNORMAL HIGH (ref 15–41)
Albumin: 1.9 g/dL — ABNORMAL LOW (ref 3.5–5.0)
BILIRUBIN TOTAL: 0.6 mg/dL (ref 0.3–1.2)
BUN: 29 mg/dL — ABNORMAL HIGH (ref 6–20)
CALCIUM: 8 mg/dL — AB (ref 8.9–10.3)
CO2: 25 mmol/L (ref 22–32)
Chloride: 109 mmol/L (ref 101–111)
Creatinine, Ser: 1.66 mg/dL — ABNORMAL HIGH (ref 0.61–1.24)
GFR calc non Af Amer: 37 mL/min — ABNORMAL LOW (ref >60)
GFR, EST AFRICAN AMERICAN: 43 mL/min — AB (ref >60)
Glucose, Bld: 119 mg/dL — ABNORMAL HIGH (ref 65–99)
Potassium: 3.2 mmol/L — ABNORMAL LOW (ref 3.5–5.1)
SODIUM: 141 mmol/L (ref 135–145)
TOTAL PROTEIN: 5.1 g/dL — AB (ref 6.5–8.1)

## 2015-04-14 LAB — MAGNESIUM: Magnesium: 2.4 mg/dL (ref 1.7–2.4)

## 2015-04-14 LAB — GLUCOSE, CAPILLARY
GLUCOSE-CAPILLARY: 112 mg/dL — AB (ref 65–99)
GLUCOSE-CAPILLARY: 123 mg/dL — AB (ref 65–99)
Glucose-Capillary: 104 mg/dL — ABNORMAL HIGH (ref 65–99)
Glucose-Capillary: 128 mg/dL — ABNORMAL HIGH (ref 65–99)
Glucose-Capillary: 168 mg/dL — ABNORMAL HIGH (ref 65–99)
Glucose-Capillary: 95 mg/dL (ref 65–99)

## 2015-04-14 LAB — HEPARIN LEVEL (UNFRACTIONATED): Heparin Unfractionated: 1.18 IU/mL — ABNORMAL HIGH (ref 0.30–0.70)

## 2015-04-14 LAB — APTT: APTT: 91 s — AB (ref 24–37)

## 2015-04-14 MED ORDER — POTASSIUM CHLORIDE 10 MEQ/100ML IV SOLN
10.0000 meq | INTRAVENOUS | Status: AC
  Administered 2015-04-14 (×5): 10 meq via INTRAVENOUS
  Filled 2015-04-14 (×3): qty 100

## 2015-04-14 NOTE — Consult Note (Signed)
Consultation Note Date: 04/14/2015   Patient Name: Jim Beck  DOB: 17-May-1932  MRN: [id number]  Age / Sex: 81 y.o., male  PCP: Seward Carol, MD Referring Physician: Allie Bossier, MD  Reason for Consultation: Establishing goals of care and Non pain symptom management    Clinical Assessment/Narrative: Pt is a 81 yo man with h/o of Hodgkin's Lymphoma ( initially dx in 1979, reoccur ance 2011; last chemo 03/27/15), admitted from 12/8 to 12/14 with PNA, Sepsis. He was DC'd on oral antibiotics to SNF for rehab but readmitted on 12/15 from SNF after being found unresponsive, BP 84/61, lactic acid 9.5, 02 sats 64%. He was placed on BiPAP and is now on venti-mask. He has increased work of breathing at rest with RR of 32/min and use of accessory muscles to breathe. He is still alert and able to speak some but easily fatigues and SHOB worsens.  Upon admission it was found that is PNA had actually worsened and is now bilateral. Antibiotics changed for better coverage. Pt states he does feel better but certainly looks acutely ill and frail. He does understand that his PNA is worse and states " my food is going into my lungs". He also understands this could r/t to his underlying lymphoma and is awaiting blood smear results and oncology input.  Contacts/Participants in Discussion: Primary Decision Maker: pt at this point   Relationship to Patient n/a HCPOA: yes  His wife, Jim Beck. Mrs. Raechel Ache was not present for this meeting  SUMMARY OF RECOMMENDATIONS DNR/DNI Would use BiPAP again but would not want to see this turn into life support No feeding tubes Verbalizes being ready if it's his time but does want to treat the treatable and still hopeful to return home Would be open to any recommendations from oncology for further palliative treatment if this is r/t his lymphoma Relies heavily on his faith and his physicians to help  guide him through this illness Code Status/Advance Care Planning: DNR    Code Status Orders        Start     Ordered   04/26/2015 1358  Do not attempt resuscitation (DNR)   Continuous    Question Answer Comment  Maintain current active treatments Yes   Do not initiate new interventions Yes      04/20/2015 1357      Other Directives:None  Symptom Management:   Dyspnea: Pt with increased work of breathing. Even though he would consider BiPAP again educated pt on the role of opioids to treat dyspnea and cough. MS04 1-2 prn availble  Palliative Prophylaxis:   Aspiration, Bowel Regimen, Oral Care and Turn Reposition  Additional Recommendations (Limitations, Scope, Preferences):  No Artificial Feeding and No Tracheostomy  Psycho-social/Spiritual:  Support System: Adequate Desire for further Chaplaincy support:no   Prognosis: Fear if pt is unable to respond to antibiotics he may have a very poor prognosis hour days to weeks barring acute respiratory failure. Pt is immunocompromised and at high risk. Did not share this impression with pt during this initial meeting  Discharge Planning: Undecided   Chief Complaint/ Primary Diagnoses: Present on Admission:  . Respiratory failure ([place]) . Pneumonia . Acute respiratory failure with hypoxia ([place]) . Essential hypertension . Pulmonary hypertension ([place]) . Dilated cardiomyopathy ([place]) . Hodgkin lymphoma of spleen ([place]) . Acute on chronic renal failure ([place]) . Paroxysmal atrial fibrillation ([facility]) . Hodgkin lymphoma of extranodal site excluding spleen and other solid organs ([place]) . Hypokalemia  I have reviewed the medical record,  interviewed the patient and family, and examined the patient. The following aspects are pertinent.  Past Medical History  Diagnosis Date  . COPD (chronic obstructive pulmonary disease) ([place])   . Tobacco abuse   . Hypertension   . Hyperlipemia   . GERD (gastroesophageal reflux disease)     large  hiatal hernia and stricture  . Hx of blood diseases     bullous blood disease  . PUD (peptic ulcer disease)   . BPH (benign prostatic hypertrophy)   . History of alcohol abuse   . Heart murmur   . Diverticulosis   . External hemorrhoids   . Arthritis   . Myocardial infarction ([place])   . Substance abuse   . Personal history of colonic polyps - adenomas 11/30/2013    12 mm and diminutive adenoma - no recall planned (age)  . Shortness of breath dyspnea     with exertion  . Depression   . Constipation   . History of hiatal hernia     per notes  . Hodgkin lymphoma ([place]) 07/2009    head and neck and throat   Social History   Social History  . Marital Status: Married    Spouse Name: N/A  . Number of Children: 2  . Years of Education: N/A   Occupational History  .     Social History Main Topics  . Smoking status: Former Smoker    Quit date: 04/27/1977  . Smokeless tobacco: Never Used  . Alcohol Use: No  . Drug Use: No  . Sexual Activity: Not Asked   Other Topics Concern  . None   Social History Narrative   Family History  Problem Relation Age of Onset  . Cancer Brother     abdominal  . Lung cancer Brother   . Throat cancer Sister   . Colon cancer Neg Hx   . Esophageal cancer Neg Hx   . Rectal cancer Neg Hx   . Stomach cancer Neg Hx    Scheduled Meds: . imipenem-cilastatin  250 mg Intravenous Q6H  . insulin aspart  0-9 Units Subcutaneous 6 times per day  . linezolid (ZYVOX) IV  600 mg Intravenous Q12H  . potassium chloride  10 mEq Intravenous Q1 Hr x 5   Continuous Infusions: . sodium chloride 50 mL/hr at 04/14/15 1244  . heparin 1,050 Units/hr (04/14/15 1507)   PRN Meds:.sodium chloride, morphine injection Medications Prior to Admission:  Prior to Admission medications   Medication Sig Start Date End Date Taking? Authorizing Provider  allopurinol (ZYLOPRIM) 100 MG tablet Take 1 tablet (100 mg total) by mouth 2 (two) times daily. 11/27/14  Yes Curt Bears, MD  aspirin 81 MG tablet Take 81 mg by mouth daily.   Yes Historical Provider, MD  OXYGEN Inhale 2 L into the lungs continuous.   Yes Historical Provider, MD  rosuvastatin (CRESTOR) 10 MG tablet Take 10 mg by mouth every morning.    Yes Historical Provider, MD  apixaban (ELIQUIS) 2.5 MG TABS tablet Take 1 tablet (2.5 mg total) by mouth 2 (two) times daily. 04/10/15   Barton Dubois, MD  cefUROXime (CEFTIN) 500 MG tablet Take 1 tablet (500 mg total) by mouth 2 (two) times daily with a meal. For 3 more days 04/10/15   Barton Dubois, MD  diltiazem (CARDIZEM CD) 180 MG 24 hr capsule Take 1 capsule (180 mg total) by mouth daily. 04/09/15   Verlee Monte, MD  lidocaine-prilocaine (EMLA) cream Apply 1 application topically as needed.  01/02/15   Carlton Adam, PA-C  metoprolol tartrate (LOPRESSOR) 25 MG tablet Take 1 tablet (25 mg total) by mouth 2 (two) times daily. 04/09/15   Verlee Monte, MD   Allergies  Allergen Reactions  . Aspirin     325mg  gives him shakes but low dose 81mg  is fine    Review of Systems  Unable to perform ROS: Severe respiratory distress    Physical Exam  Constitutional: He is oriented to person, place, and time.  Cachetic acutely ill appearing older man  Cardiovascular:  Irrg, tachy  Respiratory:  Increased work of breathing at rest. 32/min Moist cough  Neurological: He is alert and oriented to person, place, and time.  Skin: Skin is warm and dry.  Psychiatric: He has a normal mood and affect.    Vital Signs: BP 109/87 mmHg  Pulse 67  Temp(Src) 98.2 F (36.8 C) (Oral)  Resp 28  Ht 5\' 11"  (1.803 m)  Wt 90.1 kg (198 lb 10.2 oz)  BMI 27.72 kg/m2  SpO2 87%  SpO2: SpO2: (!) 87 % O2 Device:SpO2: (!) 87 % O2 Flow Rate: .O2 Flow Rate (L/min): 6 L/min  IO: Intake/output summary:  Intake/Output Summary (Last 24 hours) at 04/14/15 1630 Last data filed at 04/14/15 1600  Gross per 24 hour  Intake 2987.38 ml  Output    850 ml  Net 2137.38 ml    LBM:  Last BM Date: 04/14/15 Baseline Weight: Weight: 83.6 kg (184 lb 4.9 oz) Most recent weight: Weight: 90.1 kg (198 lb 10.2 oz)      Palliative Assessment/Data:  Flowsheet Rows        Most Recent Value   Intake Tab    Referral Department  Hospitalist   Unit at Time of Referral  Intermediate Care Unit   Palliative Care Primary Diagnosis  Pulmonary   Date Notified  04/13/15   Palliative Care Type  New Palliative care   Reason for referral  Clarify Goals of Care, Non-pain Symptom   Date of Admission  03/31/2015   Date first seen by Palliative Care  04/14/15   # of days Palliative referral response time  1 Day(s)   # of days IP prior to Palliative referral  2   Clinical Assessment    Palliative Performance Scale Score  30%   Pain Max last 24 hours  0   Pain Min Last 24 hours  0   Dyspnea Max Last 24 Hours  8   Dyspnea Min Last 24 hours  4   Nausea Max Last 24 Hours  0   Nausea Min Last 24 Hours  0   Anxiety Max Last 24 Hours  0   Anxiety Min Last 24 Hours  0   Psychosocial & Spiritual Assessment    Palliative Care Outcomes    Patient/Family meeting held?  Yes   Who was at the meeting?  pt   Palliative Care Outcomes  Improved non-pain symptom therapy, Clarified goals of care   Patient/Family wishes: Interventions discontinued/not started   PEG, Trach, Mechanical Ventilation   Palliative Care follow-up planned  Yes, Facility      Additional Data Reviewed:  CBC:    Component Value Date/Time   WBC 7.2 04/14/2015 0532   WBC 3.8* 03/27/2015 1002   HGB 8.1* 04/14/2015 0532   HGB 8.3* 03/27/2015 1002   HCT 27.0* 04/14/2015 0532   HCT 27.8* 03/27/2015 1002   PLT 343 04/14/2015 0532   PLT 358 03/27/2015 1002   MCV  80.6 04/14/2015 0532   MCV 77.9* 03/27/2015 1002   NEUTROABS 2.6 04/13/2015 1113   NEUTROABS 2.0 03/27/2015 1002   LYMPHSABS 1.0 04/21/2015 1113   LYMPHSABS 0.9 03/27/2015 1002   MONOABS 1.5* 04/15/2015 1113   MONOABS 0.9 03/27/2015 1002   EOSABS 0.0 04/13/2015 1113     EOSABS 0.0 03/27/2015 1002   BASOSABS 0.0 03/29/2015 1113   BASOSABS 0.0 03/27/2015 1002   Comprehensive Metabolic Panel:    Component Value Date/Time   NA 141 04/14/2015 0532   NA 141 03/27/2015 1002   K 3.2* 04/14/2015 0532   K 4.0 03/27/2015 1002   CL 109 04/14/2015 0532   CO2 25 04/14/2015 0532   CO2 25 03/27/2015 1002   BUN 29* 04/14/2015 0532   BUN 17.7 03/27/2015 1002   CREATININE 1.66* 04/14/2015 0532   CREATININE 1.3 03/27/2015 1002   GLUCOSE 119* 04/14/2015 0532   GLUCOSE 114 03/27/2015 1002   CALCIUM 8.0* 04/14/2015 0532   CALCIUM 9.3 03/27/2015 1002   AST 144* 04/14/2015 0532   AST 12 03/27/2015 1002   ALT 432* 04/14/2015 0532   ALT <9 03/27/2015 1002   ALKPHOS 86 04/14/2015 0532   ALKPHOS 49 03/27/2015 1002   BILITOT 0.6 04/14/2015 0532   BILITOT 0.43 03/27/2015 1002   PROT 5.1* 04/14/2015 0532   PROT 6.6 03/27/2015 1002   ALBUMIN 1.9* 04/14/2015 0532   ALBUMIN 2.9* 03/27/2015 1002     Time In: 1530 Time Out: 1640 Time Total: 70 min Greater than 50%  of this time was spent counseling and coordinating care related to the above assessment and plan.  Signed by: Dory Horn, NP  Dory Horn, NP  04/14/2015, 4:30 PM  Please contact Palliative Medicine Team phone at [phone] for questions and concerns.

## 2015-04-14 NOTE — Progress Notes (Signed)
ANTICOAGULATION CONSULT NOTE - Follow-up Consult  Pharmacy Consult for Heparin Indication: atrial fibrillation  Allergies  Allergen Reactions  . Aspirin     325mg  gives him shakes but low dose 81mg  is fine    Patient Measurements: Height: 5\' 11"  (180.3 cm) Weight: 198 lb 10.2 oz (90.1 kg) IBW/kg (Calculated) : 75.3 Heparin Dosing Weight: 83.6kg  Vital Signs: Temp: 98.3 F (36.8 C) (12/18 0435) Temp Source: Axillary (12/18 0435) BP: 129/94 mmHg (12/18 0734) Pulse Rate: 102 (12/18 0734)  Labs:  Recent Labs  04/07/2015 1345  04/25/2015 2020  04/12/15 0100 04/12/15 0530  04/12/15 2110 04/13/15 0415 04/13/15 0911 04/13/15 1844 04/14/15 0525 04/14/15 0532  HGB  --   --   --   --   --  8.2*  --   --  8.0*  --   --   --  8.1*  HCT  --   --   --   --   --  27.0*  --   --  26.0*  --   --   --  27.0*  PLT  --   --   --   --   --  365  --   --  345  --   --   --  343  APTT  --   < >  --   < >  --   --   < > 65*  --  152* 102* 91*  --   HEPARINUNFRC  --   < >  --   < >  --   --   --  >2.20*  --  2.12*  --  1.18*  --   CREATININE 2.53*  --   --   --   --  2.21*  --   --  1.87*  --   --   --  1.66*  TROPONINI 0.26*  --  0.34*  --  0.24*  --   --   --   --   --   --   --   --   < > = values in this interval not displayed.  Estimated Creatinine Clearance: 37.8 mL/min (by C-G formula based on Cr of 1.66).  . sodium chloride 50 mL/hr at 04/12/15 1933  . heparin 1,050 Units/hr (04/13/15 2015)    Assessment: 66 yoM previously on eliquis, now on IV heparin for afib. aPTT is therapeutic at 91. Heparin level remains elevated from eliquis effect. CBC is stable today, no bleeding noted.   Goal of Therapy:  Heparin level 0.3-0.7 units/ml aPTT 66-102 seconds Monitor platelets by anticoagulation protocol: Yes   Plan:  - Continue IV heparin at 1050 units/hr. - Daily HL, aPTT and CBC  Salome Arnt, PharmD, BCPS Pager # [phone] 04/14/2015 8:49 AM

## 2015-04-14 NOTE — Progress Notes (Signed)
[place] TEAM 1 - Stepdown/ICU TEAM Progress Note  Jim Beck [id number] DOB: 1932/07/22 DOA: 03/28/2015 PCP: Kandice Hams, MD  Admit HPI / Brief Narrative: 81yo BM PMHx Depression, former Smoker, COPD, HTN, MI, HLD, remote hx Polysubstance Abuse, Diverticulosis, Colonic Polyps-Adenomatous, Hodgkin's lymphoma (initially dx 1979, recurrence 2011) S/P Chemotherapy with last dose 03/27/15. Just admitted 12/8-12/14 with HCAP, sepsis, discharged on ceftin. Previously lived at home with his wife but d/c to SNF 12/14 r/t significant weakness, debilitation, malnutrition.   On am 12/15 he was found unresponsive at [facility] and brought to [facility] ED. On arrival, O2 sats 64%, BP 84/61, lactate 9.5. Placed on NRB, given IVF and PCCM consulted for ICU admission.   Pt states he is feeling better since arrival. Thinks that this was prompted by him taking off his nasal cannula to clean it and forgetting to put it back on. He c/o productive cough but unable to fully expectorate due to weakness, also c/o malaise and mild SOB. Denies hemoptysis, fevers, chills.    HPI/Subjective: 12/18 A/O 4, positive acute on chronic respiratory failure (currently on Ventimask), able to speak in 6-7 word sentences.  Assessment/Plan: Acute hypoxic respiratory failure/ Sepsis/HCAP worsening desite prior treatment/COPD exacerbation -Titrate O2 to maintain SPO2 89 and 93% -Use BiPAP to help decrease patient's WOB -PCXR worsening since last admission, infiltrates, see ID -Continue current empiric antibiotics. - HTN  -Currently controlled normal medication monitor  Paroxysmal AFib  -Currently in sinus tachycardia   Dilated cardiomyopathy - EF 55% (Echo 04/05/15) -Strict in and out since admission + 4.5 L -Daily weight;              12/18 had bed weight= 90.1 kg -Normal CHF medication on hold secondary to hypotension  Pulmonary hypertension  Acute on chronic renal failure ( baseline Scr ~ 1.1) -Hypovolemic  component -See dilated cardiomyopathy  Protein calorie malnutrition  -Dysphagia 2 diet fluid thin    Anemia chronic disease    Hodgkin's lymphoma  - recurrent? S/p chemo (last dose 11/30) -PBS pending pathology read; spoke with Dr. Luvenia Redden pathologist who stated that Dr. Tresa Moore pathologist is the Subject Matter Expert [facility]) on reading PBS for signs of lymphoma. Will be in on Monday. Contact pathology for read prior to initiating further workup. -12/18 contacted Oncology to let them know patient was readmitted and ask for consult on possible treatment options  Hypokalemia -Potassium goal> 4 -Potassium IV 50 mEq  Hyperglycemia  -Sensitive SSI   Goals of care -Palliative care consult; Patient with MSOF, acute on chronic respiratory failure, with possible recurrent non-Hodgkin's lymphoma. Discuss short-term goals of care vs [place]-term goals care    Code Status: DO NOT RESUSCITATE Family Communication: no family present at time of exam Disposition Plan: Recommend palliative care consult    Consultants: Dr.Daniel Lily Kocher PCCM phone consult Dr. Luvenia Redden pathologist    Procedure/Significant Events: 12/9 echocardiogram;- LVEF= 55% to 60%. - Ventricular septum: contour showed diastolic flattening and systolic flattening. - Right ventricle: moderately to severelydilated. The moderator band was prominent.  - Right atrium:  mildly to moderately dilated. - Tricuspid valve: moderateregurgitation. - PA peak pressure: 54 mm Hg (S).   Culture 12/15 blood right chest Port-A-Cath NGTD 12/15 blood right AC NGTD 12/15 urine negative final 12/15 MRSA by PCR negative   Antibiotics: 12/15 Vanc>>>12/16 12/15zosyn>>>12/16 12/16 Imipenem>>> 12/16 zyvox>>>   DVT prophylaxis: Heparin drip   Devices    LINES / TUBES:      Continuous Infusions: . sodium chloride  50 mL/hr at 04/12/15 1933  . heparin 1,050 Units/hr (04/13/15 2015)    Objective: VITAL  SIGNS: Temp: 98.3 F (36.8 C) (12/18 0435) Temp Source: Axillary (12/18 0435) BP: 129/94 mmHg (12/18 0734) Pulse Rate: 102 (12/18 0734) SPO2; FIO2:  Vent Mode: [-] BIPAP FiO2 (%): [40 %-100 %] 40 % Set Rate: [10 bmp] 10 bmp PEEP: [5 cmH20] 5 cmH20    Intake/Output Summary (Last 24 hours) at 04/14/15 [id number] Last data filed at 04/14/15 0800  Gross per 24 hour  Intake 2417.88 ml  Output   1250 ml  Net 1167.88 ml     Exam: General: A/O 4, cachectic, increased WOB, positive acute and chronic respiratory distress (currently on Ventimask) Eyes: Negative headache,negative scleral hemorrhage ENT: Negative Runny nose, negative ear pain, negative gingival bleeding, Neck:  Negative scars, masses, torticollis, lymphadenopathy, JVD Lungs: tachypneic, diffuse coarse breath sounds with expiratory wheezing, negative crackles  Cardiovascular: Tachycardic, Regular rate and rhythm without murmur gallop or rub normal S1 and S2 Abdomen:negative abdominal pain, nondistended, positive soft, bowel sounds, no rebound, no ascites, no appreciable mass Extremities: No significant cyanosis, clubbing, or edema bilateral lower extremities Psychiatric:  Negative depression, negative anxiety, negative fatigue, negative mania  Neurologic:  Cranial nerves II through XII intact, tongue/uvula midline, all extremities muscle strength 5/5, sensation intact throughout, negative dysarthria, negative expressive aphasia, negative receptive aphasia.   Data Reviewed: Basic Metabolic Panel:  Recent Labs Lab 04/10/15 0357 04/19/2015 1125 04/07/2015 1345 04/12/15 0530 04/13/15 0415 04/14/15 0532  NA 134* 136 137 138 141 141  K 3.6 4.9 4.3 3.5 3.4* 3.2*  CL 99* 100* 103 105 107 109  CO2 29  --  23 26 24 25   GLUCOSE 119* 182* 217* 94 117* 119*  BUN 27* 50* 36* 38* 38* 29*  CREATININE 1.51* 2.20* 2.53* 2.21* 1.87* 1.66*  CALCIUM 8.6*  --  8.0* 8.3* 8.1* 8.0*  MG  --   --   --  2.3 2.3 2.4  PHOS  --   --   --   3.8  --   --    Liver Function Tests:  Recent Labs Lab 04/14/15 0532  AST 144*  ALT 432*  ALKPHOS 86  BILITOT 0.6  PROT 5.1*  ALBUMIN 1.9*   No results for input(s): LIPASE, AMYLASE in the last 168 hours. No results for input(s): AMMONIA in the last 168 hours. CBC:  Recent Labs Lab 04/10/15 0357 04/24/2015 1113 04/19/2015 1125 04/12/15 0530 04/13/15 0415 04/14/15 0532  WBC 3.5* 5.1  --  4.4 5.8 7.2  NEUTROABS  --  2.6  --   --   --   --   HGB 8.5* 9.3* 11.9* 8.2* 8.0* 8.1*  HCT 28.6* 31.2* 35.0* 27.0* 26.0* 27.0*  MCV 79.0 81.7  --  79.9 80.2 80.6  PLT 369 426*  --  365 345 343   Cardiac Enzymes:  Recent Labs Lab 04/21/2015 1345 04/10/2015 2020 04/12/15 0100  TROPONINI 0.26* 0.34* 0.24*   BNP (last 3 results)  Recent Labs  03/29/2015 1113  BNP 523.3*    ProBNP (last 3 results) No results for input(s): PROBNP in the last 8760 hours.  CBG:  Recent Labs Lab 04/13/15 1630 04/13/15 2033 04/13/15 2130 04/14/15 0043 04/14/15 0429  GLUCAP 154* 151* 147* 104* 128*    Recent Results (from the past 240 hour(s))  Urine culture     Status: None   Collection Time: 04/04/15  8:42 PM  Result Value Ref Range Status  Specimen Description URINE, CLEAN CATCH  Final   Special Requests NONE  Final   Culture   Final    3,000 COLONIES/mL INSIGNIFICANT GROWTH Performed at [facility]    Report Status 04/06/2015 FINAL  Final  Culture, sputum-assessment     Status: None   Collection Time: 04/05/15  6:31 AM  Result Value Ref Range Status   Specimen Description SPUTUM  Final   Special Requests NONE  Final   Sputum evaluation   Final    MICROSCOPIC FINDINGS SUGGEST THAT THIS SPECIMEN IS NOT REPRESENTATIVE OF LOWER RESPIRATORY SECRETIONS. PLEASE RECOLLECT. NOTIFIED [name] AT [id number] ON 12.9.16 BY SHUEA    Report Status 04/05/2015 FINAL  Final  Culture, expectorated sputum-assessment     Status: None   Collection Time: 04/05/15  5:04 PM  Result Value Ref Range  Status   Specimen Description SPUTUM  Final   Special Requests Immunocompromised  Final   Sputum evaluation   Final    THIS SPECIMEN IS ACCEPTABLE. RESPIRATORY CULTURE REPORT TO FOLLOW.   Report Status 04/05/2015 FINAL  Final  Culture, respiratory (NON-Expectorated)     Status: None   Collection Time: 04/05/15  5:04 PM  Result Value Ref Range Status   Specimen Description SPUTUM  Final   Special Requests NONE  Final   Gram Stain   Final    NO WBC SEEN FEW SQUAMOUS EPITHELIAL CELLS PRESENT FEW GRAM NEGATIVE RODS Performed at [facility]    Culture   Final    NORMAL OROPHARYNGEAL FLORA Performed at [facility]    Report Status 04/08/2015 FINAL  Final  Culture, blood (Routine X 2) w Reflex to ID Panel     Status: None (Preliminary result)   Collection Time: 04/19/2015 11:40 AM  Result Value Ref Range Status   Specimen Description BLOOD RIGHT CHEST PORTA CATH  Final   Special Requests BOTTLES DRAWN AEROBIC AND ANAEROBIC 5CC  Final   Culture NO GROWTH 2 DAYS  Final   Report Status PENDING  Incomplete  Culture, blood (Routine X 2) w Reflex to ID Panel     Status: None (Preliminary result)   Collection Time: 04/26/2015 11:45 AM  Result Value Ref Range Status   Specimen Description BLOOD RIGHT ANTECUBITAL  Final   Special Requests BOTTLES DRAWN AEROBIC AND ANAEROBIC 10CC  Final   Culture NO GROWTH 2 DAYS  Final   Report Status PENDING  Incomplete  Urine culture     Status: None   Collection Time: 04/18/2015 12:00 PM  Result Value Ref Range Status   Specimen Description URINE, CATHETERIZED  Final   Special Requests NONE  Final   Culture NO GROWTH 1 DAY  Final   Report Status 04/12/2015 FINAL  Final  MRSA PCR Screening     Status: None   Collection Time: 04/16/2015  9:25 PM  Result Value Ref Range Status   MRSA by PCR NEGATIVE NEGATIVE Final    Comment:        The GeneXpert MRSA Assay (FDA approved for NASAL specimens only), is one component of a comprehensive MRSA  colonization surveillance program. It is not intended to diagnose MRSA infection nor to guide or monitor treatment for MRSA infections.      Studies:  Recent x-ray studies have been reviewed in detail by the Attending Physician  Scheduled Meds:  Scheduled Meds: . acetaminophen  1,000 mg Oral Once  . imipenem-cilastatin  250 mg Intravenous Q6H  . insulin aspart  0-9  Units Subcutaneous 6 times per day  . linezolid (ZYVOX) IV  600 mg Intravenous Q12H    Time spent on care of this patient: 40 mins   Raynell Upton, Geraldo Docker , MD  [facility] Office  [phone] Pager [phone]  On-Call/Text Page:      [email or web address]      password [email or web address]  If 7PM-7AM, please contact night-coverage [email or web address] Password [email or web address] 04/14/2015, 8:34 AM   LOS: 3 days   Care during the described time interval was provided by me .  I have reviewed this patient's available data, including medical history, events of note, physical examination, and all test results as part of my evaluation. I have personally reviewed and interpreted all radiology studies.   Dia Crawford, MD [phone] Pager

## 2015-04-15 LAB — CBC
HCT: 27.5 % — ABNORMAL LOW (ref 39.0–52.0)
Hemoglobin: 8 g/dL — ABNORMAL LOW (ref 13.0–17.0)
MCH: 23.6 pg — ABNORMAL LOW (ref 26.0–34.0)
MCHC: 29.1 g/dL — ABNORMAL LOW (ref 30.0–36.0)
MCV: 81.1 fL (ref 78.0–100.0)
PLATELETS: 328 10*3/uL (ref 150–400)
RBC: 3.39 MIL/uL — AB (ref 4.22–5.81)
RDW: 24.8 % — AB (ref 11.5–15.5)
WBC: 7.3 10*3/uL (ref 4.0–10.5)

## 2015-04-15 LAB — COMPREHENSIVE METABOLIC PANEL
ALBUMIN: 2 g/dL — AB (ref 3.5–5.0)
ALT: 293 U/L — AB (ref 17–63)
AST: 60 U/L — AB (ref 15–41)
Alkaline Phosphatase: 83 U/L (ref 38–126)
Anion gap: 6 (ref 5–15)
BUN: 26 mg/dL — AB (ref 6–20)
CHLORIDE: 112 mmol/L — AB (ref 101–111)
CO2: 25 mmol/L (ref 22–32)
Calcium: 8.3 mg/dL — ABNORMAL LOW (ref 8.9–10.3)
Creatinine, Ser: 1.6 mg/dL — ABNORMAL HIGH (ref 0.61–1.24)
GFR calc Af Amer: 45 mL/min — ABNORMAL LOW (ref >60)
GFR calc non Af Amer: 39 mL/min — ABNORMAL LOW (ref >60)
GLUCOSE: 128 mg/dL — AB (ref 65–99)
POTASSIUM: 3.8 mmol/L (ref 3.5–5.1)
SODIUM: 143 mmol/L (ref 135–145)
Total Bilirubin: 0.8 mg/dL (ref 0.3–1.2)
Total Protein: 5.2 g/dL — ABNORMAL LOW (ref 6.5–8.1)

## 2015-04-15 LAB — GLUCOSE, CAPILLARY
GLUCOSE-CAPILLARY: 96 mg/dL (ref 65–99)
GLUCOSE-CAPILLARY: 103 mg/dL — AB (ref 65–99)
GLUCOSE-CAPILLARY: 146 mg/dL — AB (ref 65–99)
GLUCOSE-CAPILLARY: 140 mg/dL — AB (ref 65–99)
Glucose-Capillary: 101 mg/dL — ABNORMAL HIGH (ref 65–99)
Glucose-Capillary: 177 mg/dL — ABNORMAL HIGH (ref 65–99)

## 2015-04-15 LAB — APTT: aPTT: 75 seconds — ABNORMAL HIGH (ref 24–37)

## 2015-04-15 LAB — MAGNESIUM: MAGNESIUM: 2.4 mg/dL (ref 1.7–2.4)

## 2015-04-15 LAB — HEPARIN LEVEL (UNFRACTIONATED): HEPARIN UNFRACTIONATED: 1.08 [IU]/mL — AB (ref 0.30–0.70)

## 2015-04-15 MED ORDER — INSULIN ASPART 100 UNIT/ML ~~LOC~~ SOLN
0.0000 [IU] | Freq: Three times a day (TID) | SUBCUTANEOUS | Status: DC
  Administered 2015-04-17 – 2015-04-20 (×5): 1 [IU] via SUBCUTANEOUS

## 2015-04-15 MED ORDER — METOPROLOL TARTRATE 1 MG/ML IV SOLN
10.0000 mg | Freq: Four times a day (QID) | INTRAVENOUS | Status: DC
  Administered 2015-04-15 – 2015-04-17 (×4): 10 mg via INTRAVENOUS
  Filled 2015-04-15 (×3): qty 10

## 2015-04-15 MED ORDER — METOPROLOL TARTRATE 1 MG/ML IV SOLN
10.0000 mg | Freq: Four times a day (QID) | INTRAVENOUS | Status: DC
  Filled 2015-04-15: qty 10

## 2015-04-15 MED ORDER — DILTIAZEM HCL ER COATED BEADS 180 MG PO CP24
180.0000 mg | ORAL_CAPSULE | Freq: Every day | ORAL | Status: DC
  Administered 2015-04-15 – 2015-04-19 (×5): 180 mg via ORAL
  Filled 2015-04-15 (×5): qty 1

## 2015-04-15 MED ORDER — SODIUM CHLORIDE 0.9 % IV BOLUS (SEPSIS)
500.0000 mL | Freq: Once | INTRAVENOUS | Status: AC
  Administered 2015-04-15: 500 mL via INTRAVENOUS

## 2015-04-15 NOTE — Progress Notes (Signed)
ANTICOAGULATION CONSULT NOTE - Follow-up Consult  Pharmacy Consult for Heparin Indication: atrial fibrillation  Allergies  Allergen Reactions  . Aspirin     325mg  gives him shakes but low dose 81mg  is fine    Patient Measurements: Height: 5\' 11"  (180.3 cm) Weight: 157 lb 6.5 oz (71.4 kg) IBW/kg (Calculated) : 75.3 Heparin Dosing Weight: 83.6kg  Vital Signs: Temp: 98.4 F (36.9 C) (12/19 0721) Temp Source: Axillary (12/19 0721) BP: 104/80 mmHg (12/19 0800) Pulse Rate: 146 (12/19 0800)  Labs:  Recent Labs  04/13/15 0415 04/13/15 0911 04/13/15 1844 04/14/15 0525 04/14/15 0532 04/15/15 0500  HGB 8.0*  --   --   --  8.1* 8.0*  HCT 26.0*  --   --   --  27.0* 27.5*  PLT 345  --   --   --  343 328  APTT  --  152* 102* 91*  --  75*  HEPARINUNFRC  --  2.12*  --  1.18*  --  1.08*  CREATININE 1.87*  --   --   --  1.66* 1.60*    Estimated Creatinine Clearance: 37.2 mL/min (by C-G formula based on Cr of 1.6).  . sodium chloride 50 mL/hr at 04/14/15 1244  . heparin 1,050 Units/hr (04/14/15 1900)    Assessment: 79 yoM previously on Eliquis, now on IV heparin for afib. aPTT is therapeutic at 75. Heparin level remains elevated from Eliquis effect.  CBC is stable today, no bleeding noted.   Goal of Therapy:  Heparin level 0.3-0.7 units/ml aPTT 66-102 seconds Monitor platelets by anticoagulation protocol: Yes   Plan:  - Continue IV heparin at 1050 units/hr. - Daily HL, aPTT and CBC  Riyanshi Wahab D. Jaretzy Lhommedieu, PharmD, BCPS Clinical Pharmacist Pager: [phone] 04/15/2015 9:12 AM

## 2015-04-15 NOTE — Progress Notes (Signed)
RT called to room to place pt on BIPAP.  O2 sat was reading 74% but was a very poor waveform and not believed to be an accurate reading.  It was jumping around from 100% then backl to the 80's. Pt was slightly uncomfortable but stated that he did not want the BIPAP on him.  Family requested that nurse come in and give them an update.  RN notified.  RT will continue to monitor.

## 2015-04-15 NOTE — Care Management Important Message (Signed)
Important Message  Patient Details  Name: Jim Beck MRN: [id number] Date of Birth: [birth year]   Medicare Important Message Given:  Yes    Delana Manganello [place] 04/15/2015, 1:19 PM

## 2015-04-15 NOTE — NC FL2 (Signed)
[place] MEDICAID FL2 LEVEL OF CARE SCREENING TOOL     IDENTIFICATION  Patient Name: Jim Beck Birthdate: [birth year] Sex: male Admission Date (Current Location): 04/20/2015  [facility] and [place] Number: Herbalist and Address:  [facility] [place]. [facility], [place] [address] [place], [place] [address]      Provider Number: [id number]  Attending Physician Name and Address:  Cherene Altes, MD  Relative Name and Phone Number:       Current Level of Care: Hospital Recommended Level of Care: [place] Prior Approval Number:    Date Approved/Denied:   PASRR Number: [id number]  Discharge Plan: SNF    Current Diagnoses: Patient Active Problem List   Diagnosis Date Noted  . Paroxysmal atrial fibrillation (HCC)   . Hodgkin lymphoma of extranodal site excluding spleen and other solid organs ([place])   . Hypokalemia   . Palliative care encounter   . Acute respiratory failure with hypoxia ([place])   . Essential hypertension   . Pulmonary hypertension ([place])   . Dilated cardiomyopathy ([place])   . Hodgkin lymphoma of spleen ([place])   . Acute on chronic renal failure ([place])   . Pneumonia   . Respiratory failure ([place]) 04/01/2015  . Atrial fibrillation ([place])   . Sepsis ([place])   . Protein-calorie malnutrition, severe 04/05/2015  . Weakness 04/04/2015  . Anemia 04/04/2015  . HCAP (healthcare-associated pneumonia) 04/04/2015  . COPD (chronic obstructive pulmonary disease) ([place]) 04/04/2015  . HTN (hypertension) 04/04/2015  . HLD (hyperlipidemia) 04/04/2015  . Hodgkin lymphoma of lymph nodes of axilla ([place]) 03/27/2015  . Constipation 01/30/2015  . Neutropenia ([place]) 01/17/2015  . Encounter for antineoplastic chemotherapy 12/19/2014  . Hodgkin lymphoma ([place]) 11/12/2014  . NHL (non-Hodgkin's lymphoma) ([place]) 07/26/2014  . Personal history of colonic polyps - adenomas 11/30/2013  . GERD with stricture 02/05/2011    Orientation RESPIRATION BLADDER Height &  Weight    Self, Time, Situation, Place  O2 External catheter   198 lbs.  BEHAVIORAL SYMPTOMS/MOOD NEUROLOGICAL BOWEL NUTRITION STATUS      Incontinent Diet (see DC summary)  AMBULATORY STATUS COMMUNICATION OF NEEDS Skin   Extensive Assist Verbally Normal                       Personal Care Assistance Level of Assistance    Bathing Assistance: Maximum assistance   Dressing Assistance: Maximum assistance     Functional Limitations Info             SPECIAL CARE FACTORS FREQUENCY                       Contractures      Additional Factors Info  Code Status, Allergies, Insulin Sliding Scale Code Status Info: DNR Allergies Info: Aspirin   Insulin Sliding Scale Info: 6/day       Current Medications (04/15/2015):  This is the current hospital active medication list Current Facility-Administered Medications  Medication Dose Route Frequency Provider Last Rate Last Dose  . 0.9 %  sodium chloride infusion  250 mL Intravenous PRN Marijean Heath, NP 10 mL/hr at 04/12/15 1700 250 mL at 04/12/15 1700  . 0.9 %  sodium chloride infusion   Intravenous Continuous Marijean Heath, NP 50 mL/hr at 04/15/15 1024    . diltiazem (CARDIZEM CD) 24 hr capsule 180 mg  180 mg Oral Daily Cherene Altes, MD   180 mg at 04/15/15 1201  . heparin ADULT  infusion 100 units/mL (25000 units/250 mL)  1,050 Units/hr Intravenous Continuous Pat Patrick, RPH 10.5 mL/hr at 04/14/15 1900 1,050 Units/hr at 04/14/15 1900  . imipenem-cilastatin (PRIMAXIN) 250 mg in sodium chloride 0.9 % 100 mL IVPB  250 mg Intravenous Q6H Raylene Miyamoto, MD   250 mg at 04/15/15 1014  . insulin aspart (novoLOG) injection 0-9 Units  0-9 Units Subcutaneous 6 times per day Marijean Heath, NP   1 Units at 04/15/15 1201  . linezolid (ZYVOX) IVPB 600 mg  600 mg Intravenous Q12H Raylene Miyamoto, MD   600 mg at 04/15/15 1011  . metoprolol (LOPRESSOR) injection 10 mg  10 mg Intravenous 4 times per day  Cherene Altes, MD   Stopped at 04/15/15 1201  . morphine 2 MG/ML injection 1-2 mg  1-2 mg Intravenous Q3H PRN Dory Horn, NP   2 mg at 04/15/15 1040     Discharge Medications: Please see discharge summary for a list of discharge medications.  Relevant Imaging Results:  Relevant Lab Results:   Additional Information SS#: [id number]  Jim Beck, [place]

## 2015-04-15 NOTE — Progress Notes (Signed)
PULMONARY / CRITICAL CARE MEDICINE   Name: Jim Beck MRN: [id number] DOB: 10-Sep-1932    ADMISSION DATE:  04/10/2015   REFERRING MD:  EDP  CHIEF COMPLAINT:  Respiratory failure, sepsis   HISTORY OF PRESENT ILLNESS:   81yo male former smoker with hx COPD, HTN, remote hx polysubstance abuse, Hodgkin's lymphoma (initially dx 1979, recurrence 2011) s/p chemotherapy with last dose 03/27/15.  Just admitted 12/8-12/14 with HCAP, sepsis, discharged on ceftin.  Previously lived at home with his wife but d/c to SNF 12/14 r/t significant weakness, debilitation, malnutrition.  On am 12/15 he was found unresponsive at [facility] and brought to [facility] ED.  On arrival, O2 sats 64%, BP 84/61, lactate 9.5. Placed on NRB, given IVF and PCCM consulted for ICU admission.    Pt states he is feeling better since arrival.  Thinks that this was prompted by him taking off his nasal cannula to clean it and forgetting to put it back on.  He c/o productive cough but unable to fully expectorate due to weakness, also c/o malaise and mild SOB. Denies hemoptysis, fevers, chills.     VITAL SIGNS: BP 104/80 mmHg  Pulse 146  Temp(Src) 98.4 F (36.9 C) (Axillary)  Resp 31  Ht 5\' 11"  (1.803 m)  Wt 71.4 kg (157 lb 6.5 oz)  BMI 21.96 kg/m2  SpO2 90%  HEMODYNAMICS:    VENTILATOR SETTINGS: Vent Mode:  [-]  FiO2 (%):  [55 %] 55 %  INTAKE / OUTPUT: I/O last 3 completed shifts: In: 3055.9 [P.O.:580; I.V.:1275.9; IV Piggyback:1200] Out: 1450 [Urine:1450]  PHYSICAL EXAMINATION: General:  Frail, thin, chronically ill appearing male, on bipap but no distress Neuro:  Awakens, responds to questions HEENT:  Mm dry, NRB  Cardiovascular:  s1s2rrr  Lungs: reduced Abdomen:  Soft, +bs  Musculoskeletal:  Warm and dry, 1+ BLE edema   LABS:  BMET  Recent Labs Lab 04/13/15 0415 04/14/15 0532 04/15/15 0500  NA 141 141 143  K 3.4* 3.2* 3.8  CL 107 109 112*  CO2 24 25 25   BUN 38* 29* 26*  CREATININE 1.87* 1.66* 1.60*   GLUCOSE 117* 119* 128*    Electrolytes  Recent Labs Lab 04/12/15 0530 04/13/15 0415 04/14/15 0532 04/15/15 0500  CALCIUM 8.3* 8.1* 8.0* 8.3*  MG 2.3 2.3 2.4 2.4  PHOS 3.8  --   --   --     CBC  Recent Labs Lab 04/13/15 0415 04/14/15 0532 04/15/15 0500  WBC 5.8 7.2 7.3  HGB 8.0* 8.1* 8.0*  HCT 26.0* 27.0* 27.5*  PLT 345 343 328    Coag's  Recent Labs Lab 04/13/15 1844 04/14/15 0525 04/15/15 0500  APTT 102* 91* 75*    Sepsis Markers  Recent Labs Lab 04/10/2015 1126 04/16/2015 1351 04/10/2015 2020  LATICACIDVEN 9.51* 3.56* 1.1    ABG  Recent Labs Lab 04/19/2015 1215  PHART 7.400  PCO2ART 38.8  PO2ART 116.0*    Liver Enzymes  Recent Labs Lab 04/14/15 0532 04/15/15 0500  AST 144* 60*  ALT 432* 293*  ALKPHOS 86 83  BILITOT 0.6 0.8  ALBUMIN 1.9* 2.0*    Cardiac Enzymes  Recent Labs Lab 04/05/2015 1345 04/09/2015 2020 04/12/15 0100  TROPONINI 0.26* 0.34* 0.24*    Glucose  Recent Labs Lab 04/14/15 1333 04/14/15 1643 04/14/15 1928 04/15/15 0124 04/15/15 0406 04/15/15 0720  GLUCAP 112* 168* 123* 101* 96 103*    Imaging No results found. STUDIES:  12/15 CXR >> L sided infiltrate   CULTURES: Urine 12/15>>> negative  BC x 2 12/15>>>  ANTIBIOTICS: 12/15 Vanc>>>12/16 12/15zosyn>>>12/16 12/16 Imipenem>>> 12/16 zyvox>>>  SIGNIFICANT EVENTS: Comfortable, taking PO  LINES/TUBES:   DISCUSSION: 81yo immunocompromised male r/t chemo therapy for Recurrence Hodkins lymphoma, just d/c 12/14, returns with PNA, sepsis.    ASSESSMENT / PLAN:  PULMONARY Acute hypoxic respiratory failure  HCAP worsening desite prior treatment Hx COPD  lymphangitic spread? P:   sats goal 90% BD  CARDIOVASCULAR Hx HTN  AFib  Sepsis  sCHF - EF 55% (Echo 04/05/15) P:  ASA  Hold home lopressor  Back on diltiazem PO Hold eliquis for now  Heparin gtt per pharmacy   RENAL AKI on CKD - baseline Scr ~ 1.1; plateaued at 1.6 Hypovolemic  component P:   Keep pos balance Chem in am   GASTROINTESTINAL Protein calorie malnutrition  P:   Tolerating PO  HEMATOLOGIC Anemia - chronic  hodgkin's lymphoma - recurrent.  S/p chemo (last dose 11/30) P:  F/u cbc  Hep drip  INFECTIOUS HCAP (recent ZOSYN, failed) Immunocompromised  P:   12/15 Vanc>>>12/16 12/15 zosyn>>>12/16 12/16 Imipenem>>> 12/16 zyvox>>>  ? Able to transition to a PO regimen soon. No positive cx's  Day 4 of 8 Abx on 12/19  ENDOCRINE Hyperglycemia   P:   SSI   NEUROLOGIC AMS - resolved.  R/t hypoxia. Now alert, appropriate.  P:   Supportive care    Baltazar Apo, MD, PhD 04/15/2015, 2:07 PM [facility] [phone] or if no answer [phone]

## 2015-04-15 NOTE — Progress Notes (Signed)
[place] TEAM 1 - Stepdown/ICU TEAM PROGRESS NOTE  Jim Beck [id number] DOB: 03/22/1933 DOA: 04/18/2015 PCP: Kandice Hams, MD  Admit HPI / Brief Narrative: 81yo M Hx depression, prior smoker, COPD, HTN, MI, HLD, remote polysubstance abuse, diverticulosis, colonic polyps-adenomatous, Hodgkin's lymphoma (initially dx 1979, recurrence 2011) S/P chemotherapy with last dose 03/27/15 who was admitted 12/8-12/14 with HCAP + sepsis, and discharged on ceftin. Previously lived at home with his wife but d/c to SNF 12/14 r/t significant weakness, debilitation, malnutrition.   On am 12/15 he was found unresponsive at [facility] and brought to [facility] ED. On arrival, O2 sats 64%, BP 84/61, lactate 9.5. Placed on NRB, given IVF and PCCM consulted for ICU admission.  HPI/Subjective: Pt has been experiencing bouts of RVR today.  He is alert and conversant at the time of my visit.  He states he feels mildly sob, but denies cp, n/v, or abdom pain.  There is no family present at the time of my exam.    Assessment/Plan:  Acute hypoxic respiratory failure/ Sepsis / HCAP / COPD exacerbation -Titrate O2 to maintain SPO2 89 and 93% - at time of my visit pt's sat is 95-100% on 0.5 venturi  -Use BiPAP prn to help decrease patient's WOB  -Continue empiric antibiotics -PCCM following w/ [place]   Paroxysmal AFib w/ acute RVR  -afib w/ RVR recurring this afternoon - resumed home oral CCB - dosed w/ IV BB as well w/ HR control following soon after - follow on tele   HTN  -well controlled at this time   Dilated cardiomyopathy - EF 55% (Echo 04/05/15) -no evidence of signif volume overload at this time - follow clinically - daily weights and strict Is/Os to continue   Pulmonary hypertension  Acute on chronic renal failure (baseline Scr ~ 1.1) -renal function slowly improving - cont to follow trend - not yet back to baseline   Protein calorie malnutrition   Anemia chronic disease -Hgb presently holding steady  around 8.0   Hodgkin's lymphoma  -?recurrent - last dose of chemo tx 11/30 -Oncology consulted on 12/18 for comment - awaiting their recs   Hypokalemia -corrected w/ replacement - follow   Hyperglycemia  -CBG reasonably controlled at present   Goals of care -Palliative care following - pt desires ongoing active care and is ok w/ BIPAP if necessary   Code Status: NO CODE BLUE Family Communication: no family present at time of exam Disposition Plan: SDU  Consultants: PCCM Palliative Care   Procedures: none  Antibiotics: Vanc >12/15 Zosyn >12/15 12/16 Imipenem> 12/16 zyvox>  DVT prophylaxis: IV heparin   Objective: Blood pressure 108/89, pulse 146, temperature 98.6 F (37 C), temperature source Axillary, resp. rate 36, height 5\' 11"  (1.803 m), weight 71.4 kg (157 lb 6.5 oz), SpO2 90 %.  Intake/Output Summary (Last 24 hours) at 04/15/15 1214 Last data filed at 04/15/15 0900  Gross per 24 hour  Intake 1311.5 ml  Output   1350 ml  Net  -38.5 ml   Exam: General: modest resp distress, but able to speak in full sentences  Lungs: course breath sounds th/o - no wheeze  Cardiovascular: Regular rate and rhythm without murmur gallop or rub normal S1 and S2 Abdomen: Nontender, nondistended, soft, bowel sounds positive, no rebound, no ascites, no appreciable mass Extremities: No significant cyanosis, clubbing, or edema bilateral lower extremities  Data Reviewed: Basic Metabolic Panel:  Recent Labs Lab 03/31/2015 1345 04/12/15 0530 04/13/15 0415 04/14/15 0532 04/15/15 0500  NA 137  138 141 141 143  K 4.3 3.5 3.4* 3.2* 3.8  CL 103 105 107 109 112*  CO2 23 26 24 25 25   GLUCOSE 217* 94 117* 119* 128*  BUN 36* 38* 38* 29* 26*  CREATININE 2.53* 2.21* 1.87* 1.66* 1.60*  CALCIUM 8.0* 8.3* 8.1* 8.0* 8.3*  MG  --  2.3 2.3 2.4 2.4  PHOS  --  3.8  --   --   --     CBC:  Recent Labs Lab 04/24/2015 1113 04/09/2015 1125 04/12/15 0530 04/13/15 0415 04/14/15 0532  04/15/15 0500  WBC 5.1  --  4.4 5.8 7.2 7.3  NEUTROABS 2.6  --   --   --   --   --   HGB 9.3* 11.9* 8.2* 8.0* 8.1* 8.0*  HCT 31.2* 35.0* 27.0* 26.0* 27.0* 27.5*  MCV 81.7  --  79.9 80.2 80.6 81.1  PLT 426*  --  365 345 343 328    Liver Function Tests:  Recent Labs Lab 04/14/15 0532 04/15/15 0500  AST 144* 60*  ALT 432* 293*  ALKPHOS 86 83  BILITOT 0.6 0.8  PROT 5.1* 5.2*  ALBUMIN 1.9* 2.0*    Recent Labs Lab 04/12/15 2110 04/13/15 0911 04/13/15 1844 04/14/15 0525 04/15/15 0500  APTT 65* 152* 102* 91* 75*    Cardiac Enzymes:  Recent Labs Lab 04/09/2015 1345 04/12/2015 2020 04/12/15 0100  TROPONINI 0.26* 0.34* 0.24*    CBG:  Recent Labs Lab 04/14/15 1643 04/14/15 1928 04/15/15 0124 04/15/15 0406 04/15/15 0720  GLUCAP 168* 123* 101* 96 103*    Recent Results (from the past 240 hour(s))  Culture, expectorated sputum-assessment     Status: None   Collection Time: 04/05/15  5:04 PM  Result Value Ref Range Status   Specimen Description SPUTUM  Final   Special Requests Immunocompromised  Final   Sputum evaluation   Final    THIS SPECIMEN IS ACCEPTABLE. RESPIRATORY CULTURE REPORT TO FOLLOW.   Report Status 04/05/2015 FINAL  Final  Culture, respiratory (NON-Expectorated)     Status: None   Collection Time: 04/05/15  5:04 PM  Result Value Ref Range Status   Specimen Description SPUTUM  Final   Special Requests NONE  Final   Gram Stain   Final    NO WBC SEEN FEW SQUAMOUS EPITHELIAL CELLS PRESENT FEW GRAM NEGATIVE RODS Performed at [facility]    Culture   Final    NORMAL OROPHARYNGEAL FLORA Performed at [facility]    Report Status 04/08/2015 FINAL  Final  Culture, blood (Routine X 2) w Reflex to ID Panel     Status: None (Preliminary result)   Collection Time: 04/21/2015 11:40 AM  Result Value Ref Range Status   Specimen Description BLOOD RIGHT CHEST PORTA CATH  Final   Special Requests BOTTLES DRAWN AEROBIC AND ANAEROBIC 5CC   Final   Culture NO GROWTH 3 DAYS  Final   Report Status PENDING  Incomplete  Culture, blood (Routine X 2) w Reflex to ID Panel     Status: None (Preliminary result)   Collection Time: 04/02/2015 11:45 AM  Result Value Ref Range Status   Specimen Description BLOOD RIGHT ANTECUBITAL  Final   Special Requests BOTTLES DRAWN AEROBIC AND ANAEROBIC 10CC  Final   Culture NO GROWTH 3 DAYS  Final   Report Status PENDING  Incomplete  Urine culture     Status: None   Collection Time: 04/02/2015 12:00 PM  Result Value Ref Range Status  Specimen Description URINE, CATHETERIZED  Final   Special Requests NONE  Final   Culture NO GROWTH 1 DAY  Final   Report Status 04/12/2015 FINAL  Final  MRSA PCR Screening     Status: None   Collection Time: 04/23/2015  9:25 PM  Result Value Ref Range Status   MRSA by PCR NEGATIVE NEGATIVE Final    Comment:        The GeneXpert MRSA Assay (FDA approved for NASAL specimens only), is one component of a comprehensive MRSA colonization surveillance program. It is not intended to diagnose MRSA infection nor to guide or monitor treatment for MRSA infections.      Studies:   Recent x-ray studies have been reviewed in detail by the Attending Physician  Scheduled Meds:  Scheduled Meds: . diltiazem  180 mg Oral Daily  . imipenem-cilastatin  250 mg Intravenous Q6H  . insulin aspart  0-9 Units Subcutaneous 6 times per day  . linezolid (ZYVOX) IV  600 mg Intravenous Q12H  . metoprolol  10 mg Intravenous 4 times per day    Time spent on care of this patient: 35 mins   Alleigh Mollica T , MD   [facility] Office  [phone] Pager - Text Page per Shea Evans as per below:  On-Call/Text Page:      [email or web address]      password [email or web address]  If 7PM-7AM, please contact night-coverage [email or web address] Password [email or web address] 04/15/2015, 12:14 PM   LOS: 4 days

## 2015-04-16 ENCOUNTER — Inpatient Hospital Stay (HOSPITAL_COMMUNITY): Payer: Medicare Other

## 2015-04-16 DIAGNOSIS — C8109 Nodular lymphocyte predominant Hodgkin lymphoma, extranodal and solid organ sites: Secondary | ICD-10-CM

## 2015-04-16 DIAGNOSIS — J441 Chronic obstructive pulmonary disease with (acute) exacerbation: Secondary | ICD-10-CM | POA: Diagnosis present

## 2015-04-16 LAB — COMPREHENSIVE METABOLIC PANEL
ALBUMIN: 2 g/dL — AB (ref 3.5–5.0)
ALT: 578 U/L — AB (ref 17–63)
AST: 864 U/L — AB (ref 15–41)
Alkaline Phosphatase: 122 U/L (ref 38–126)
Anion gap: 6 (ref 5–15)
BUN: 39 mg/dL — AB (ref 6–20)
CHLORIDE: 116 mmol/L — AB (ref 101–111)
CO2: 22 mmol/L (ref 22–32)
CREATININE: 2.39 mg/dL — AB (ref 0.61–1.24)
Calcium: 8.4 mg/dL — ABNORMAL LOW (ref 8.9–10.3)
GFR calc Af Amer: 28 mL/min — ABNORMAL LOW (ref >60)
GFR calc non Af Amer: 24 mL/min — ABNORMAL LOW (ref >60)
Glucose, Bld: 133 mg/dL — ABNORMAL HIGH (ref 65–99)
POTASSIUM: 4.7 mmol/L (ref 3.5–5.1)
SODIUM: 144 mmol/L (ref 135–145)
Total Bilirubin: 0.6 mg/dL (ref 0.3–1.2)
Total Protein: 5.4 g/dL — ABNORMAL LOW (ref 6.5–8.1)

## 2015-04-16 LAB — MAGNESIUM: Magnesium: 2.4 mg/dL (ref 1.7–2.4)

## 2015-04-16 LAB — GLUCOSE, CAPILLARY
GLUCOSE-CAPILLARY: 114 mg/dL — AB (ref 65–99)
GLUCOSE-CAPILLARY: 207 mg/dL — AB (ref 65–99)
Glucose-Capillary: 174 mg/dL — ABNORMAL HIGH (ref 65–99)
Glucose-Capillary: 125 mg/dL — ABNORMAL HIGH (ref 65–99)

## 2015-04-16 LAB — CBC
HCT: 27.3 % — ABNORMAL LOW (ref 39.0–52.0)
HEMOGLOBIN: 8 g/dL — AB (ref 13.0–17.0)
MCH: 24.2 pg — AB (ref 26.0–34.0)
MCHC: 29.3 g/dL — AB (ref 30.0–36.0)
MCV: 82.5 fL (ref 78.0–100.0)
PLATELETS: 319 10*3/uL (ref 150–400)
RBC: 3.31 MIL/uL — ABNORMAL LOW (ref 4.22–5.81)
RDW: 25.1 % — ABNORMAL HIGH (ref 11.5–15.5)
WBC: 9.5 10*3/uL (ref 4.0–10.5)

## 2015-04-16 LAB — CULTURE, BLOOD (ROUTINE X 2)
CULTURE: NO GROWTH
CULTURE: NO GROWTH

## 2015-04-16 LAB — APTT: APTT: 87 s — AB (ref 24–37)

## 2015-04-16 LAB — HEPARIN LEVEL (UNFRACTIONATED): Heparin Unfractionated: 0.96 IU/mL — ABNORMAL HIGH (ref 0.30–0.70)

## 2015-04-16 NOTE — Progress Notes (Signed)
Pt had labored breathing with tachypnea, morphine was given but did not help breathing pattern. RN spoke with pt about the BIPAP, to let me know if he wanted to use it.  Pt ended up requesting it.  On BIPAP and resting with oxygen sats 90-92%. BUN and creatinine are worsening and pt had only 100 ml of urine output between 1900 - 0700.

## 2015-04-16 NOTE — Progress Notes (Signed)
Daily Progress Note   Patient Name: Jim Beck       Date: 04/16/2015 DOB: 16-Nov-1932  Age: 81 y.o. MRN#: [id number] Attending Physician: Allie Bossier, MD Primary Care Physician: Kandice Hams, MD Admit Date: 04/02/2015  Reason for Consultation/Follow-up: Establishing goals of care  Subjective: I met today with Jim Beck and his grandson is at bedside. I told him that I am worried about him as it seems we are not making a lot of progress - he is still requiring a lot of oxygen and is tachypneic and his renal function is worrisome today as well. He tells me that he feels like he is improving. I asked if he felt short of breath or felt like he was working to breathe but he tells me no. We discussed the use of morphine and he says that this helps a little bit. I asked him if he had thought about if we cannot get him any better than he is now or if he declined. He tells me "I do not want any machines." DNR confirmed. I told him that I thought this is worth considering where to go from here if he does not improve but he feels like he is improving. I encouraged him to discuss these things with his family but he does not seem interesting in doing this at this time. For now he wishes to continue fairly aggressive care short of resuscitation. Yolanda Bonine was present for conversation - he had no questions/concerns.   I would like to discuss further with Jim Beck and his wife. They also have 7 children according to grandson. Will try again tomorrow.    Length of Stay: 5 days  Current Medications: Scheduled Meds:  . diltiazem  180 mg Oral Daily  . imipenem-cilastatin  250 mg Intravenous Q6H  . insulin aspart  0-9 Units Subcutaneous TID WC  . linezolid (ZYVOX) IV  600 mg Intravenous Q12H  . metoprolol   10 mg Intravenous 4 times per day    Continuous Infusions: . sodium chloride 50 mL/hr at 04/15/15 1024  . heparin 1,050 Units/hr (04/15/15 1433)    PRN Meds: morphine injection  Physical Exam: Physical Exam  Constitutional: He is oriented to person, place, and time. He appears well-developed.  HENT:  Head: Normocephalic and atraumatic.  Cardiovascular: Normal rate.  Pulmonary/Chest: No accessory muscle usage. Tachypnea noted. He is in respiratory distress.  Abdominal: Normal appearance.  Neurological: He is alert and oriented to person, place, and time.                Vital Signs: BP 102/68 mmHg  Pulse 71  Temp(Src) 98.9 F (37.2 C) (Oral)  Resp 34  Ht _0  (1.803 m)  Wt 92.5 kg (203 lb 14.8 oz)  BMI 28.45 kg/m2  SpO2 89% SpO2: SpO2: (!) 89 % O2 Device: O2 Device: Venturi Mask O2 Flow Rate: O2 Flow Rate (L/min): 14 L/min  Intake/output summary:  Intake/Output Summary (Last 24 hours) at 04/16/15 1141 Last data filed at 04/16/15 0700  Gross per 24 hour  Intake   2090 ml  Output    250 ml  Net   1840 ml   LBM: Last BM Date: 04/14/15 Baseline Weight: Weight: 83.6 kg (184 lb 4.9 oz) Most recent weight: Weight: 92.5 kg (203 lb 14.8 oz)       Palliative Assessment/Data: Flowsheet Rows        Most Recent Value   Intake Tab    Referral Department  Hospitalist   Unit at Time of Referral  Intermediate Care Unit   Palliative Care Primary Diagnosis  Pulmonary   Date Notified  04/13/15   Palliative Care Type  New Palliative care   Reason for referral  Clarify Goals of Care, Non-pain Symptom   Date of Admission  04/14/2015   Date first seen by Palliative Care  04/14/15   # of days Palliative referral response time  1 Day(s)   # of days IP prior to Palliative referral  2   Clinical Assessment    Palliative Performance Scale Score  30%   Pain Max last 24 hours  0   Pain Min Last 24 hours  0   Dyspnea Max Last 24 Hours  8   Dyspnea Min Last 24 hours  4   Nausea Max  Last 24 Hours  0   Nausea Min Last 24 Hours  0   Anxiety Max Last 24 Hours  0   Anxiety Min Last 24 Hours  0   Psychosocial & Spiritual Assessment    Palliative Care Outcomes    Patient/Family meeting held?  Yes   Who was at the meeting?  pt   Palliative Care Outcomes  Improved non-pain symptom therapy, Clarified goals of care   Patient/Family wishes: Interventions discontinued/not started   PEG, Trach, Mechanical Ventilation   Palliative Care follow-up planned  Yes, Facility      Additional Data Reviewed: CBC    Component Value Date/Time   WBC 9.5 04/16/2015 0422   WBC 3.8* 03/27/2015 1002   RBC 3.31* 04/16/2015 0422   RBC 3.57* 03/27/2015 1002   RBC 4.15* 08/01/2009 0440   HGB 8.0* 04/16/2015 0422   HGB 8.3* 03/27/2015 1002   HCT 27.3* 04/16/2015 0422   HCT 27.8* 03/27/2015 1002   PLT 319 04/16/2015 0422   PLT 358 03/27/2015 1002   MCV 82.5 04/16/2015 0422   MCV 77.9* 03/27/2015 1002   MCH 24.2* 04/16/2015 0422   MCH 23.2* 03/27/2015 1002   MCHC 29.3* 04/16/2015 0422   MCHC 29.9* 03/27/2015 1002   RDW 25.1* 04/16/2015 0422   RDW 23.6* 03/27/2015 1002   LYMPHSABS 1.0 04/25/2015 1113   LYMPHSABS 0.9 03/27/2015 1002   MONOABS 1.5* 04/07/2015 1113   MONOABS 0.9 03/27/2015 1002   EOSABS 0.0 04/23/2015 1113  EOSABS 0.0 03/27/2015 1002   BASOSABS 0.0 04/16/2015 1113   BASOSABS 0.0 03/27/2015 1002    CMP     Component Value Date/Time   NA 144 04/16/2015 0422   NA 141 03/27/2015 1002   K 4.7 04/16/2015 0422   K 4.0 03/27/2015 1002   CL 116* 04/16/2015 0422   CO2 22 04/16/2015 0422   CO2 25 03/27/2015 1002   GLUCOSE 133* 04/16/2015 0422   GLUCOSE 114 03/27/2015 1002   BUN 39* 04/16/2015 0422   BUN 17.7 03/27/2015 1002   CREATININE 2.39* 04/16/2015 0422   CREATININE 1.3 03/27/2015 1002   CALCIUM 8.4* 04/16/2015 0422   CALCIUM 9.3 03/27/2015 1002   PROT 5.4* 04/16/2015 0422   PROT 6.6 03/27/2015 1002   ALBUMIN 2.0* 04/16/2015 0422   ALBUMIN 2.9* 03/27/2015  1002   AST 864* 04/16/2015 0422   AST 12 03/27/2015 1002   ALT 578* 04/16/2015 0422   ALT <9 03/27/2015 1002   ALKPHOS 122 04/16/2015 0422   ALKPHOS 49 03/27/2015 1002   BILITOT 0.6 04/16/2015 0422   BILITOT 0.43 03/27/2015 1002   GFRNONAA 24* 04/16/2015 0422   GFRAA 28* 04/16/2015 0422       Problem List:  Patient Active Problem List   Diagnosis Date Noted  . Paroxysmal atrial fibrillation (HCC)   . Hodgkin lymphoma of extranodal site excluding spleen and other solid organs ([place])   . Hypokalemia   . Palliative care encounter   . Acute respiratory failure with hypoxia ([place])   . Essential hypertension   . Pulmonary hypertension ([place])   . Dilated cardiomyopathy ([place])   . Hodgkin lymphoma of spleen ([place])   . Acute on chronic renal failure ([place])   . Pneumonia   . Respiratory failure ([place]) 04/04/2015  . Atrial fibrillation ([place])   . Sepsis ([place])   . Protein-calorie malnutrition, severe 04/05/2015  . Weakness 04/04/2015  . Anemia 04/04/2015  . HCAP (healthcare-associated pneumonia) 04/04/2015  . COPD (chronic obstructive pulmonary disease) ([place]) 04/04/2015  . HTN (hypertension) 04/04/2015  . HLD (hyperlipidemia) 04/04/2015  . 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 Constipation 01/30/2015  . Neutropenia ([place]) 01/17/2015  . Encounter for antineoplastic chemotherapy 12/19/2014  . Hodgkin lymphoma ([place]) 11/12/2014  . NHL (non-Hodgkin's lymphoma) ([place]) 07/26/2014  . Personal history of colonic polyps - adenomas 11/30/2013  . GERD with stricture 02/05/2011     Palliative Care Assessment & Plan    1.Code Status:  DNR    Code Status Orders        Start     Ordered   04/21/2015 1358  Do not attempt resuscitation (DNR)   Continuous    Question Answer Comment  Maintain current active treatments Yes   Do not initiate new interventions Yes      04/10/2015 1357       2. Goals of Care/Additional Recommendations:  Continue aggressive care  outside DNR at this time. He tells me he feels like he is improving. I explained that I am very concerned about him.   Desire for further Chaplaincy support:no  Psycho-social Needs: Caregiving  Support/Resources and Education on Hospice  3. Symptom Management:      1. Continue morphine for dyspnea.   4. Palliative Prophylaxis:   Bowel Regimen, Delirium Protocol and Oral Care  5. Prognosis: Very concerned that prognosis could be weeks or less.   6. Discharge Planning:  To be determined.    Thank you for allowing the Palliative  Medicine Team to assist in the care of this patient.   Time In: 1110 Time Out: 1140 Total Time 69mn Prolonged Time Billed  no         APershing Proud NP  101/99/2415 11:41 AM  Please contact Palliative Medicine Team phone at [phone] questions and concerns.

## 2015-04-16 NOTE — Progress Notes (Signed)
Pt placed on BiPap at this time due to work of breathing.

## 2015-04-16 NOTE — Progress Notes (Signed)
PT placed on BIPAP at this time per his request.

## 2015-04-16 NOTE — Progress Notes (Signed)
[place] TEAM 1 - Stepdown/ICU TEAM Progress Note  Jim Beck [id number] DOB: 06-20-1932 DOA: 04/01/2015 PCP: Kandice Hams, MD  Admit HPI / Brief Narrative: 81yo BM PMHx Depression, former Smoker, COPD, HTN, MI, HLD, remote hx Polysubstance Abuse, Diverticulosis, Colonic Polyps-Adenomatous, Hodgkin's lymphoma (initially dx 1979, recurrence 2011) S/P Chemotherapy with last dose 03/27/15. Just admitted 12/8-12/14 with HCAP, sepsis, discharged on ceftin. Previously lived at home with his wife but d/c to SNF 12/14 r/t significant weakness, debilitation, malnutrition.   On am 12/15 he was found unresponsive at [facility] and brought to [facility] ED. On arrival, O2 sats 64%, BP 84/61, lactate 9.5. Placed on NRB, given IVF and PCCM consulted for ICU admission.   Pt states he is feeling better since arrival. Thinks that this was prompted by him taking off his nasal cannula to clean it and forgetting to put it back on. He c/o productive cough but unable to fully expectorate due to weakness, also c/o malaise and mild SOB. Denies hemoptysis, fevers, chills.    HPI/Subjective: 12/20 A/O 4, positive acute on chronic respiratory failure. Currently patient on Ventimask at 6 L with positive WOB, SPO2 70s to low 80s. Patient was placed back on nonrebreather with obvious WOB.  Assessment/Plan: Acute hypoxic respiratory failure/ Sepsis/HCAP worsening desite prior treatment/COPD exacerbation -Titrate O2 to maintain SPO2 89 and 93% -PCXR worsening since last admission, infiltrates, see ID -Use BiPAP to help decrease patient's WOB; patient currently on nonrebreather -Continue current empiric antibiotics. -Patient kind to decide on going home on hospice. -Palliative care schedule to see patient again today to discuss goals of care - HTN  -Currently controlled normal medication monitor  Paroxysmal AFib  -Currently in sinus tachycardia   Dilated cardiomyopathy - EF 55% (Echo 04/05/15) -Strict in and  out since admission + 6.8 L -Daily weight;              12/ 20 had bed weight= 92.5 kg -Normal CHF medication on hold secondary to hypotension  Pulmonary hypertension  Acute on chronic renal failure ( baseline Scr ~ 1.1) -Hypovolemic component -See dilated cardiomyopathy  Protein calorie malnutrition  -Dysphagia 2 diet fluid thin    Anemia chronic disease    Hodgkin's lymphoma  - recurrent? S/p chemo (last dose 11/30) -PBS pending pathology read; spoke with Dr. Luvenia Redden pathologist who stated that Dr. Tresa Moore pathologist is the Subject Matter Expert [facility]) on reading PBS for signs of lymphoma. Will be in on Monday. Contact pathology for read prior to initiating further workup. -12/18 contacted Oncology to let them know patient was readmitted and ask for consult on possible treatment options -12/20 spoke Dr. Curt Bears oncology and he stated there was no further treatment that could be offered concerning his lymphoma.  Hypokalemia -Potassium goal> 4 -Potassium IV 50 mEq  Hyperglycemia  -Sensitive SSI   Goals of care -Palliative care consult; Patient with MSOF, acute on chronic respiratory failure, with possible recurrent non-Hodgkin's lymphoma. Discuss short-term goals of care vs [place]-term goals care -12/20 Again concerning the fact that more than likely he will not survive this hospitalization patient trying to decide on home hospice.    Code Status: DO NOT RESUSCITATE Family Communication: no family present at time of exam Disposition Plan: Recommend palliative care consult    Consultants: Dr.Daniel Lily Kocher PCCM phone consult Dr. Luvenia Redden pathologist Phone consult Dr. Curt Bears oncology   Procedure/Significant Events: 12/9 echocardiogram;- LVEF= 55% to 60%. - Ventricular septum: contour showed diastolic flattening and systolic flattening. -  Right ventricle: moderately to severelydilated. The moderator band was prominent.  - Right  atrium:  mildly to moderately dilated. - Tricuspid valve: moderateregurgitation. - PA peak pressure: 54 mm Hg (S).   Culture 12/15 blood right chest Port-A-Cath NGTD 12/15 blood right AC NGTD 12/15 urine negative final 12/15 MRSA by PCR negative   Antibiotics: 12/15 Vanc>>>12/16 12/15zosyn>>>12/16 12/16 Imipenem>>> 12/16 zyvox>>>   DVT prophylaxis: Heparin drip   Devices    LINES / TUBES:      Continuous Infusions: . sodium chloride 50 mL/hr at 04/15/15 1024  . heparin 1,050 Units/hr (04/16/15 1203)    Objective: VITAL SIGNS: Temp: 97.2 F (36.2 C) (12/20 1600) Temp Source: Oral (12/20 1600) BP: 95/78 mmHg (12/20 1700) Pulse Rate: 70 (12/20 1700) SPO2; FIO2:  Vent Mode:   Intake/Output Summary (Last 24 hours) at 04/16/15 2016 Last data filed at 04/16/15 1700  Gross per 24 hour  Intake   1586 ml  Output    100 ml  Net   1486 ml     Exam: General: A/O 4, cachectic, increased WOB, positive acute and chronic respiratory distress (currently on nonrebreather) Eyes: Negative headache,negative scleral hemorrhage ENT: Negative Runny nose, negative ear pain, negative gingival bleeding, Neck:  Negative scars, masses, torticollis, lymphadenopathy, JVD Lungs: significantly tachypneic, diffuse coarse breath sounds with expiratory wheezing, negative crackles  Cardiovascular: Tachycardic, Regular rate and rhythm without murmur gallop or rub normal S1 and S2 Abdomen:negative abdominal pain, nondistended, positive soft, bowel sounds, no rebound, no ascites, no appreciable mass Extremities: No significant cyanosis, clubbing, or edema bilateral lower extremities Psychiatric:  Negative depression, negative anxiety, negative fatigue, negative mania  Neurologic:  Cranial nerves II through XII intact, tongue/uvula midline, all extremities muscle strength 5/5, sensation intact throughout, negative dysarthria, negative expressive aphasia, negative receptive  aphasia.   Data Reviewed: Basic Metabolic Panel:  Recent Labs Lab 04/12/15 0530 04/13/15 0415 04/14/15 0532 04/15/15 0500 04/16/15 0422  NA 138 141 141 143 144  K 3.5 3.4* 3.2* 3.8 4.7  CL 105 107 109 112* 116*  CO2 26 24 25 25 22   GLUCOSE 94 117* 119* 128* 133*  BUN 38* 38* 29* 26* 39*  CREATININE 2.21* 1.87* 1.66* 1.60* 2.39*  CALCIUM 8.3* 8.1* 8.0* 8.3* 8.4*  MG 2.3 2.3 2.4 2.4 2.4  PHOS 3.8  --   --   --   --    Liver Function Tests:  Recent Labs Lab 04/14/15 0532 04/15/15 0500 04/16/15 0422  AST 144* 60* 864*  ALT 432* 293* 578*  ALKPHOS 86 83 122  BILITOT 0.6 0.8 0.6  PROT 5.1* 5.2* 5.4*  ALBUMIN 1.9* 2.0* 2.0*   No results for input(s): LIPASE, AMYLASE in the last 168 hours. No results for input(s): AMMONIA in the last 168 hours. CBC:  Recent Labs Lab 04/09/2015 1113  04/12/15 0530 04/13/15 0415 04/14/15 0532 04/15/15 0500 04/16/15 0422  WBC 5.1  --  4.4 5.8 7.2 7.3 9.5  NEUTROABS 2.6  --   --   --   --   --   --   HGB 9.3*  < > 8.2* 8.0* 8.1* 8.0* 8.0*  HCT 31.2*  < > 27.0* 26.0* 27.0* 27.5* 27.3*  MCV 81.7  --  79.9 80.2 80.6 81.1 82.5  PLT 426*  --  365 345 343 328 319  < > = values in this interval not displayed. Cardiac Enzymes:  Recent Labs Lab 03/28/2015 1345 03/29/2015 2020 04/12/15 0100  TROPONINI 0.26* 0.34* 0.24*   BNP (  last 3 results)  Recent Labs  04/18/2015 1113  BNP 523.3*    ProBNP (last 3 results) No results for input(s): PROBNP in the last 8760 hours.  CBG:  Recent Labs Lab 04/15/15 1732 04/15/15 2052 04/16/15 0856 04/16/15 1223 04/16/15 1621  GLUCAP 177* 140* 114* 174* 125*    Recent Results (from the past 240 hour(s))  Culture, blood (Routine X 2) w Reflex to ID Panel     Status: None   Collection Time: 04/01/2015 11:40 AM  Result Value Ref Range Status   Specimen Description BLOOD RIGHT CHEST PORTA CATH  Final   Special Requests BOTTLES DRAWN AEROBIC AND ANAEROBIC 5CC  Final   Culture NO GROWTH 5 DAYS   Final   Report Status 04/16/2015 FINAL  Final  Culture, blood (Routine X 2) w Reflex to ID Panel     Status: None   Collection Time: 03/31/2015 11:45 AM  Result Value Ref Range Status   Specimen Description BLOOD RIGHT ANTECUBITAL  Final   Special Requests BOTTLES DRAWN AEROBIC AND ANAEROBIC 10CC  Final   Culture NO GROWTH 5 DAYS  Final   Report Status 04/16/2015 FINAL  Final  Urine culture     Status: None   Collection Time: 04/07/2015 12:00 PM  Result Value Ref Range Status   Specimen Description URINE, CATHETERIZED  Final   Special Requests NONE  Final   Culture NO GROWTH 1 DAY  Final   Report Status 04/12/2015 FINAL  Final  MRSA PCR Screening     Status: None   Collection Time: 04/21/2015  9:25 PM  Result Value Ref Range Status   MRSA by PCR NEGATIVE NEGATIVE Final    Comment:        The GeneXpert MRSA Assay (FDA approved for NASAL specimens only), is one component of a comprehensive MRSA colonization surveillance program. It is not intended to diagnose MRSA infection nor to guide or monitor treatment for MRSA infections.      Studies:  Recent x-ray studies have been reviewed in detail by the Attending Physician  Scheduled Meds:  Scheduled Meds: . diltiazem  180 mg Oral Daily  . imipenem-cilastatin  250 mg Intravenous Q6H  . insulin aspart  0-9 Units Subcutaneous TID WC  . linezolid (ZYVOX) IV  600 mg Intravenous Q12H  . metoprolol  10 mg Intravenous 4 times per day    Time spent on care of this patient: 40 mins   WOODS, Geraldo Docker , MD  [facility] Office  [phone] Pager - [phone]  On-Call/Text Page:      [email or web address]      password [email or web address]  If 7PM-7AM, please contact night-coverage [email or web address] Password [email or web address] 04/16/2015, 8:16 PM   LOS: 5 days   Care during the described time interval was provided by me .  I have reviewed this patient's available data, including medical history, events of note, physical examination, and all test results as part  of my evaluation. I have personally reviewed and interpreted all radiology studies.   Dia Crawford, MD [phone] Pager

## 2015-04-16 NOTE — Progress Notes (Signed)
ANTICOAGULATION CONSULT NOTE - Follow-up Consult  Pharmacy Consult for Heparin Indication: atrial fibrillation  Allergies  Allergen Reactions  . Aspirin     325mg  gives him shakes but low dose 81mg  is fine    Patient Measurements: Height: 5\' 11"  (180.3 cm) Weight: 203 lb 14.8 oz (92.5 kg) IBW/kg (Calculated) : 75.3 Heparin Dosing Weight: 83.6kg  Vital Signs: Temp: 99.1 F (37.3 C) (12/20 0400) Temp Source: Axillary (12/20 0400) BP: 102/68 mmHg (12/20 0800) Pulse Rate: 71 (12/20 0800)  Labs:  Recent Labs  04/14/15 0525  04/14/15 0532 04/15/15 0500 04/16/15 0421 04/16/15 0422  HGB  --   < > 8.1* 8.0*  --  8.0*  HCT  --   --  27.0* 27.5*  --  27.3*  PLT  --   --  343 328  --  319  APTT 91*  --   --  75* 87*  --   HEPARINUNFRC 1.18*  --   --  1.08* 0.96*  --   CREATININE  --   --  1.66* 1.60*  --  2.39*  < > = values in this interval not displayed.  Estimated Creatinine Clearance: 28.7 mL/min (by C-G formula based on Cr of 2.39).  . sodium chloride 50 mL/hr at 04/15/15 1024  . heparin 1,050 Units/hr (04/15/15 1433)    Assessment: 50 yoM previously on Eliquis, now on IV heparin for afib. aPTT is therapeutic at 87 seconds. Heparin level remains elevated from Eliquis effect- at 0.96units/mL today. Correlation beginning, but not quite able to only use heparin levels yet. CBC is stable today, no bleeding noted.   Goal of Therapy:  Heparin level 0.3-0.7 units/ml aPTT 66-102 seconds Monitor platelets by anticoagulation protocol: Yes   Plan:  - Continue IV heparin at 1050 units/hr. - Daily HL, aPTT and CBC  Jaze Rodino D. Jaquavious Mercer, PharmD, BCPS Clinical Pharmacist Pager: [phone] 04/16/2015 9:28 AM

## 2015-04-16 NOTE — Progress Notes (Signed)
Speech Language Pathology Treatment: Dysphagia  Patient Details Name: Jim Beck MRN: [id number] DOB: 10/01/1932 Today's Date: 04/16/2015 Time: [id number] SLP Time Calculation (min) (ACUTE ONLY): 14 min  Assessment / Plan / Recommendation Clinical Impression  Per chart review and discussion with RN, note changes in respiratory status that have occurred since last SLP visit. Throughout the day, pt has fluctuated between BiPAP and venturi mask - currently wearing mask with RR in the low 30s and SpO2 in the low 90s at rest. SLP provided assistance with small amounts of jell-o, with increase in RR almost immediately upon removal of mask to administer boluses. After a few bites, while vital signs remained relatively stable with mask in place, pt began to have delayed coughing. Discussed with pt and RN - at this time, feel as though pt's main focus needs to be on breathing rather than swallowing. Recommend maintaining NPO status for today with SLP f/u on next date for reassessment. When ready, pt will benefit from MBS to more objectively assess aspiration risk prior to restarting POs.   HPI HPI: 81yo male former smoker with hx COPD, HTN, remote hx polysubstance abuse, Hodgkin's lymphoma (initially dx 1979, recurrence 2011) s/p chemotherapy with last dose 03/27/15. Just admitted 12/8-12/14 with HCAP, sepsis, discharged on ceftin. Previously lived at home with his wife but d/c to SNF 12/14 r/t significant weakness, debilitation, malnutrition. On am 12/15 he was found unresponsive at [facility] and brought to [facility] ED.      SLP Plan  MBS (when ready from respiratory standpoint)     Recommendations  Diet recommendations: NPO Medication Administration: Crushed with puree       Oral Care Recommendations: Oral care QID Follow up Recommendations:  (tba) Plan: MBS (when ready from respiratory standpoint)   [name], M.A. CCC-SLP [phone]  [name] 04/16/2015, 4:16 PM

## 2015-04-17 ENCOUNTER — Other Ambulatory Visit: Payer: Medicare Other

## 2015-04-17 LAB — COMPREHENSIVE METABOLIC PANEL
ALK PHOS: 102 U/L (ref 38–126)
ALT: 507 U/L — AB (ref 17–63)
AST: 431 U/L — AB (ref 15–41)
Albumin: 2 g/dL — ABNORMAL LOW (ref 3.5–5.0)
Anion gap: 9 (ref 5–15)
BUN: 42 mg/dL — AB (ref 6–20)
CALCIUM: 8.5 mg/dL — AB (ref 8.9–10.3)
CO2: 22 mmol/L (ref 22–32)
CREATININE: 2.24 mg/dL — AB (ref 0.61–1.24)
Chloride: 115 mmol/L — ABNORMAL HIGH (ref 101–111)
GFR, EST AFRICAN AMERICAN: 30 mL/min — AB (ref >60)
GFR, EST NON AFRICAN AMERICAN: 26 mL/min — AB (ref >60)
Glucose, Bld: 140 mg/dL — ABNORMAL HIGH (ref 65–99)
Potassium: 4.4 mmol/L (ref 3.5–5.1)
Sodium: 146 mmol/L — ABNORMAL HIGH (ref 135–145)
Total Bilirubin: 0.7 mg/dL (ref 0.3–1.2)
Total Protein: 5.2 g/dL — ABNORMAL LOW (ref 6.5–8.1)

## 2015-04-17 LAB — CBC
HCT: 26.1 % — ABNORMAL LOW (ref 39.0–52.0)
HEMOGLOBIN: 7.7 g/dL — AB (ref 13.0–17.0)
MCH: 24.4 pg — AB (ref 26.0–34.0)
MCHC: 29.5 g/dL — ABNORMAL LOW (ref 30.0–36.0)
MCV: 82.6 fL (ref 78.0–100.0)
Platelets: 270 10*3/uL (ref 150–400)
RBC: 3.16 MIL/uL — AB (ref 4.22–5.81)
RDW: 25.4 % — ABNORMAL HIGH (ref 11.5–15.5)
WBC: 9.1 10*3/uL (ref 4.0–10.5)

## 2015-04-17 LAB — APTT
aPTT: 106 seconds — ABNORMAL HIGH (ref 24–37)
aPTT: 101 seconds — ABNORMAL HIGH (ref 24–37)

## 2015-04-17 LAB — HEPARIN LEVEL (UNFRACTIONATED)
HEPARIN UNFRACTIONATED: 0.94 [IU]/mL — AB (ref 0.30–0.70)
Heparin Unfractionated: 1.06 IU/mL — ABNORMAL HIGH (ref 0.30–0.70)

## 2015-04-17 LAB — GLUCOSE, CAPILLARY
GLUCOSE-CAPILLARY: 114 mg/dL — AB (ref 65–99)
Glucose-Capillary: 122 mg/dL — ABNORMAL HIGH (ref 65–99)
Glucose-Capillary: 110 mg/dL — ABNORMAL HIGH (ref 65–99)
Glucose-Capillary: 118 mg/dL — ABNORMAL HIGH (ref 65–99)

## 2015-04-17 LAB — MAGNESIUM: Magnesium: 2.4 mg/dL (ref 1.7–2.4)

## 2015-04-17 MED ORDER — METOPROLOL TARTRATE 1 MG/ML IV SOLN
5.0000 mg | Freq: Four times a day (QID) | INTRAVENOUS | Status: DC | PRN
  Administered 2015-04-18 – 2015-04-21 (×5): 5 mg via INTRAVENOUS
  Filled 2015-04-17 (×5): qty 5

## 2015-04-17 MED ORDER — SODIUM CHLORIDE 0.9 % IV BOLUS (SEPSIS)
250.0000 mL | Freq: Once | INTRAVENOUS | Status: AC
  Administered 2015-04-17: 250 mL via INTRAVENOUS

## 2015-04-17 MED ORDER — SODIUM CHLORIDE 0.9 % IV BOLUS (SEPSIS)
250.0000 mL | Freq: Once | INTRAVENOUS | Status: AC
Start: 2015-04-17 — End: 2015-04-17
  Administered 2015-04-17: 250 mL via INTRAVENOUS

## 2015-04-17 NOTE — Progress Notes (Signed)
SLP Cancellation Note  Patient Details Name: Jim Beck MRN: [id number] DOB: 12-Jul-1932   Cancelled treatment:       Reason Eval/Treat Not Completed: Medical issues which prohibited therapy. Pt currently with venturi mask, RR in the high 30s and SpO2 in the mid 80s at rest. Pt's respiratory needs remain high - would hold PO trials at this time. Discussed with RN who says that pt has been requesting drinks - may wish to discuss comfort feeds with palliative care. SLP to continue to follow.    [name], M.A. CCC-SLP [phone]  [name] 04/17/2015, 9:09 AM

## 2015-04-17 NOTE — Progress Notes (Addendum)
[place] TEAM 1 - Stepdown/ICU TEAM Progress Note  Jim Beck [id number] DOB: 18-Mar-1933 DOA: 04/05/2015 PCP: Kandice Hams, MD  Admit HPI / Brief Narrative: 81yo BM PMHx Depression, former Smoker, COPD, HTN, MI, HLD, remote hx Polysubstance Abuse, Diverticulosis, Colonic Polyps-Adenomatous, Hodgkin's lymphoma (initially dx 1979, recurrence 2011) S/P Chemotherapy with last dose 03/27/15. Just admitted 12/8-12/14 with HCAP, sepsis, discharged on ceftin. Previously lived at home with his wife but d/c to SNF 12/14 r/t significant weakness, debilitation, malnutrition.   On am 12/15 he was found unresponsive at [facility] and brought to [facility] ED. On arrival, O2 sats 64%, BP 84/61, lactate 9.5. Placed on NRB, given IVF and PCCM consulted for ICU admission.   Pt states he is feeling better since arrival. Thinks that this was prompted by him taking off his nasal cannula to clean it and forgetting to put it back on. He c/o productive cough but unable to fully expectorate due to weakness, also c/o malaise and mild SOB. Denies hemoptysis, fevers, chills.    HPI/Subjective: Patient placed back on BiPAP because of severe dyspnea, now hypotensive with systolic in the [id number]. Speech therapy still recommending nothing by mouth   Assessment/Plan: Acute hypoxic respiratory failure/ Sepsis/HCAP worsening desite prior treatment/COPD exacerbation -Titrate O2 to maintain SPO2 89 and 93%, prn BiPAP Repeat chest x-ray shows no change in left base but right basilar opacity appears to be worse -Use BiPAP to help decrease patient's WOB; patient currently on nonrebreather -Continue current empiric antibiotics. -Patient trying to decide on going home on hospice. -Palliative care recommends morphine p for dyspnea rn  - HTN  -Currently controlled normal medication monitor  Paroxysmal AFib Currently on diltiazem, IV metoprolol pushes Patient received 2 fluid boluses 250 mL each to maintain his blood pressure  into the 90s,   Dilated cardiomyopathy - EF 55% (Echo 04/05/15) -Strict in and out since admission + 6.8 L -Daily weight;              12/ 20 had bed weight= 92.5 kg -Normal CHF medication on hold secondary to hypotension  Pulmonary hypertension  Acute on chronic renal failure ( baseline Scr ~ 1.1) -Hypovolemic component -See dilated cardiomyopathy  Protein calorie malnutrition  npo  Anemia chronic disease    Hodgkin's lymphoma  - recurrent? S/p chemo (last dose 11/30) -PBS pending pathology read; spoke with Dr. Luvenia Redden pathologist who stated that Dr. Tresa Moore pathologist is the Subject Matter Expert [facility]) on reading PBS for signs of lymphoma. Will be in on Monday. Contact pathology for read prior to initiating further workup. -12/18 contacted Oncology to let them know patient was readmitted and ask for consult on possible treatment options -12/20 spoke Dr. Curt Bears oncology and he stated there was no further treatment that could be offered concerning his lymphoma.   Hypokalemia -Potassium goal> 4 -Potassium IV 50 mEq  Hyperglycemia  -Sensitive SSI   Goals of care -Palliative care consult; Patient with MSOF, acute on chronic respiratory failure, with possible recurrent non-Hodgkin's lymphoma. Discuss short-term goals of care vs [place]-term goals care -12/20 Again concerning the fact that more than likely he will not survive this hospitalization patient trying to decide on home hospice.    Code Status: DO NOT RESUSCITATE Family Communication: no family present at time of exam Disposition Plan: Recommend palliative care consult    Consultants: Dr.Daniel Lily Kocher PCCM phone consult Dr. Luvenia Redden pathologist Phone consult Dr. Curt Bears oncology   Procedure/Significant Events: 12/9 echocardiogram;- LVEF= 55% to 60%. -  Ventricular septum: contour showed diastolic flattening and systolic flattening. - Right ventricle: moderately to  severelydilated. The moderator band was prominent.  - Right atrium:  mildly to moderately dilated. - Tricuspid valve: moderateregurgitation. - PA peak pressure: 54 mm Hg (S).   Culture 12/15 blood right chest Port-A-Cath NGTD 12/15 blood right AC NGTD 12/15 urine negative final 12/15 MRSA by PCR negative   Antibiotics: 12/15 Vanc>>>12/16 12/15zosyn>>>12/16 12/16 Imipenem>>> 12/16 zyvox>>>   DVT prophylaxis: Heparin drip   Devices    LINES / TUBES:      Continuous Infusions: . sodium chloride 50 mL/hr at 04/17/15 0901  . heparin 950 Units/hr (04/17/15 1117)    Objective: VITAL SIGNS: Temp: 97.2 F (36.2 C) (12/21 1158) Temp Source: Axillary (12/21 1158) BP: 82/67 mmHg (12/21 1400) Pulse Rate: 67 (12/21 1400) SPO2; FIO2:  Vent Mode:   Intake/Output Summary (Last 24 hours) at 04/17/15 1438 Last data filed at 04/17/15 1433  Gross per 24 hour  Intake   2118 ml  Output    950 ml  Net   1168 ml     Exam: General: A/O 4, cachectic, increased WOB, positive acute and chronic respiratory distress (currently on nonrebreather) Eyes: Negative headache,negative scleral hemorrhage ENT: Negative Runny nose, negative ear pain, negative gingival bleeding, Neck:  Negative scars, masses, torticollis, lymphadenopathy, JVD Lungs: significantly tachypneic, diffuse coarse breath sounds with expiratory wheezing, negative crackles  Cardiovascular: Tachycardic, Regular rate and rhythm without murmur gallop or rub normal S1 and S2 Abdomen:negative abdominal pain, nondistended, positive soft, bowel sounds, no rebound, no ascites, no appreciable mass Extremities: No significant cyanosis, clubbing, or edema bilateral lower extremities Psychiatric:  Negative depression, negative anxiety, negative fatigue, negative mania  Neurologic:  Cranial nerves II through XII intact, tongue/uvula midline, all extremities muscle strength 5/5, sensation intact throughout, negative  dysarthria, negative expressive aphasia, negative receptive aphasia.   Data Reviewed: Basic Metabolic Panel:  Recent Labs Lab 04/12/15 0530 04/13/15 0415 04/14/15 0532 04/15/15 0500 04/16/15 0422 04/17/15 0430  NA 138 141 141 143 144 146*  K 3.5 3.4* 3.2* 3.8 4.7 4.4  CL 105 107 109 112* 116* 115*  CO2 26 24 25 25 22 22   GLUCOSE 94 117* 119* 128* 133* 140*  BUN 38* 38* 29* 26* 39* 42*  CREATININE 2.21* 1.87* 1.66* 1.60* 2.39* 2.24*  CALCIUM 8.3* 8.1* 8.0* 8.3* 8.4* 8.5*  MG 2.3 2.3 2.4 2.4 2.4 2.4  PHOS 3.8  --   --   --   --   --    Liver Function Tests:  Recent Labs Lab 04/14/15 0532 04/15/15 0500 04/16/15 0422 04/17/15 0430  AST 144* 60* 864* 431*  ALT 432* 293* 578* 507*  ALKPHOS 86 83 122 102  BILITOT 0.6 0.8 0.6 0.7  PROT 5.1* 5.2* 5.4* 5.2*  ALBUMIN 1.9* 2.0* 2.0* 2.0*   No results for input(s): LIPASE, AMYLASE in the last 168 hours. No results for input(s): AMMONIA in the last 168 hours. CBC:  Recent Labs Lab 04/06/2015 1113  04/13/15 0415 04/14/15 0532 04/15/15 0500 04/16/15 0422 04/17/15 0430  WBC 5.1  < > 5.8 7.2 7.3 9.5 9.1  NEUTROABS 2.6  --   --   --   --   --   --   HGB 9.3*  < > 8.0* 8.1* 8.0* 8.0* 7.7*  HCT 31.2*  < > 26.0* 27.0* 27.5* 27.3* 26.1*  MCV 81.7  < > 80.2 80.6 81.1 82.5 82.6  PLT 426*  < > 345 343 328  319 270  < > = values in this interval not displayed. Cardiac Enzymes:  Recent Labs Lab 04/27/2015 1345 04/12/2015 2020 04/12/15 0100  TROPONINI 0.26* 0.34* 0.24*   BNP (last 3 results)  Recent Labs  03/28/2015 1113  BNP 523.3*    ProBNP (last 3 results) No results for input(s): PROBNP in the last 8760 hours.  CBG:  Recent Labs Lab 04/16/15 1223 04/16/15 1621 04/16/15 2147 04/17/15 0811 04/17/15 1155  GLUCAP 174* 125* 207* 122* 110*    Recent Results (from the past 240 hour(s))  Culture, blood (Routine X 2) w Reflex to ID Panel     Status: None   Collection Time: 03/30/2015 11:40 AM  Result Value Ref Range  Status   Specimen Description BLOOD RIGHT CHEST PORTA CATH  Final   Special Requests BOTTLES DRAWN AEROBIC AND ANAEROBIC 5CC  Final   Culture NO GROWTH 5 DAYS  Final   Report Status 04/16/2015 FINAL  Final  Culture, blood (Routine X 2) w Reflex to ID Panel     Status: None   Collection Time: 04/17/2015 11:45 AM  Result Value Ref Range Status   Specimen Description BLOOD RIGHT ANTECUBITAL  Final   Special Requests BOTTLES DRAWN AEROBIC AND ANAEROBIC 10CC  Final   Culture NO GROWTH 5 DAYS  Final   Report Status 04/16/2015 FINAL  Final  Urine culture     Status: None   Collection Time: 04/24/2015 12:00 PM  Result Value Ref Range Status   Specimen Description URINE, CATHETERIZED  Final   Special Requests NONE  Final   Culture NO GROWTH 1 DAY  Final   Report Status 04/12/2015 FINAL  Final  MRSA PCR Screening     Status: None   Collection Time: 04/05/2015  9:25 PM  Result Value Ref Range Status   MRSA by PCR NEGATIVE NEGATIVE Final    Comment:        The GeneXpert MRSA Assay (FDA approved for NASAL specimens only), is one component of a comprehensive MRSA colonization surveillance program. It is not intended to diagnose MRSA infection nor to guide or monitor treatment for MRSA infections.      Studies:  Recent x-ray studies have been reviewed in detail by the Attending Physician  Scheduled Meds:  Scheduled Meds: . diltiazem  180 mg Oral Daily  . imipenem-cilastatin  250 mg Intravenous Q6H  . insulin aspart  0-9 Units Subcutaneous TID WC  . linezolid (ZYVOX) IV  600 mg Intravenous Q12H    Time spent on care of this patient: 22 mins   Reyne Dumas , MD  [facility] Office  [phone] Pager - [phone]  On-Call/Text Page:      [email or web address]      password [email or web address]  If 7PM-7AM, please contact night-coverage [email or web address] Password [email or web address] 04/17/2015, 2:38 PM   LOS: 6 days   Care during the described time interval was provided by me .  I have reviewed this patient's  available data, including medical history, events of note, physical examination, and all test results as part of my evaluation. I have personally reviewed and interpreted all radiology studies.

## 2015-04-17 NOTE — Progress Notes (Signed)
ANTICOAGULATION CONSULT NOTE - Follow-up Consult  Pharmacy Consult for Heparin Indication: atrial fibrillation  Allergies  Allergen Reactions  . Aspirin     325mg  gives him shakes but low dose 81mg  is fine    Patient Measurements: Height: 5\' 11"  (180.3 cm) Weight: 207 lb 10.8 oz (94.2 kg) IBW/kg (Calculated) : 75.3 Heparin Dosing Weight: 83.6kg  Vital Signs: Temp: 96.9 F (36.1 C) (12/21 0340) Temp Source: Axillary (12/21 0340) BP: 88/70 mmHg (12/21 0732) Pulse Rate: 68 (12/21 0732)  Labs:  Recent Labs  04/15/15 0500 04/16/15 0421 04/16/15 0422 04/17/15 0430  HGB 8.0*  --  8.0* 7.7*  HCT 27.5*  --  27.3* 26.1*  PLT 328  --  319 270  APTT 75* 87*  --  106*  HEPARINUNFRC 1.08* 0.96*  --  0.94*  CREATININE 1.60*  --  2.39* 2.24*    Estimated Creatinine Clearance: 30.8 mL/min (by C-G formula based on Cr of 2.24).  . sodium chloride 50 mL/hr at 04/17/15 0600  . heparin 1,050 Units/hr (04/17/15 0600)    Assessment: 18 yoM previously on Eliquis, now on IV heparin for afib. aPTT is slightly elevated at 106 seconds. Heparin level remains elevated from Eliquis effect, but trending down- at 0.94units/mL today. Correlation beginning, but not quite able to only use heparin levels yet. CBC is down some today, no bleeding noted.  SCr peak at 2.39- now trending back down.   Goal of Therapy:  Heparin level 0.3-0.7 units/ml aPTT 66-102 seconds Monitor platelets by anticoagulation protocol: Yes   Plan:  - Reduced IV heparin to 950 units/hr. - Repeat aPTT and HL in 8 hours - may be able to use just HL soon.  - Daily HL, aPTT and CBC  Sloan Leiter, PharmD, BCPS Clinical Pharmacist [phone]  04/17/2015 7:40 AM

## 2015-04-17 NOTE — Progress Notes (Signed)
ANTICOAGULATION CONSULT NOTE - Follow-up Consult  Pharmacy Consult for Heparin Indication: atrial fibrillation  Allergies  Allergen Reactions  . Aspirin     325mg  gives him shakes but low dose 81mg  is fine   Patient Measurements: Height: 5\' 11"  (180.3 cm) Weight: 207 lb 10.8 oz (94.2 kg) IBW/kg (Calculated) : 75.3 Heparin Dosing Weight: 83.6kg  Vital Signs: Temp: 96.9 F (36.1 C) (12/21 1624) Temp Source: Axillary (12/21 1624) BP: 112/79 mmHg (12/21 1624) Pulse Rate: 67 (12/21 1624)  Labs:  Recent Labs  04/15/15 0500 04/16/15 0421 04/16/15 0422 04/17/15 0430 04/17/15 1712  HGB 8.0*  --  8.0* 7.7*  --   HCT 27.5*  --  27.3* 26.1*  --   PLT 328  --  319 270  --   APTT 75* 87*  --  106* 101*  HEPARINUNFRC 1.08* 0.96*  --  0.94* 1.06*  CREATININE 1.60*  --  2.39* 2.24*  --     Estimated Creatinine Clearance: 30.8 mL/min (by C-G formula based on Cr of 2.24).  . sodium chloride 50 mL/hr at 04/17/15 0901  . heparin 950 Units/hr (04/17/15 1117)   Assessment: 63 yoM previously on Eliquis, now on IV heparin for afib. aPTT is now within goal range at 101 seconds (upper end).  Heparin level remains elevated from Eliquis effect.  Would expect this to trend downward in the next 48-72 hours.  Goal of Therapy:  Heparin level 0.3-0.7 units/ml aPTT 66-102 seconds Monitor platelets by anticoagulation protocol: Yes   Plan:  - Reduced IV heparin to 900 units/hr. - Repeat aPTT and HL in AM  - Daily HL, aPTT and CBC  Rober Minion, PharmD., MS Clinical Pharmacist Pager:  [phone] Thank you for allowing pharmacy to be part of this patients care team. 04/17/2015 7:02 PM

## 2015-04-17 NOTE — Progress Notes (Signed)
Palliative:  I met with daughter, [name], outside the room today. She seems to have a good understanding that her father's prognosis is extremely poor and expresses desire to focus on keeping him happy and comfortable. She reiterates that he would not want resuscitation. I expressed that it would be useful for [place] to meet with family to decide how to best care for him and even consideration of hospice facility (they would not be able to take him home). She says that she believes her brother would like to be involved in this conversation and she will let me know a time they can meet later today or tomorrow. I will await call back. I did not speak with patient today as he was visiting with other people in the room and I will await family meeting.   No Charge  Vinie Sill, NP Palliative Medicine Team Pager # [phone] (M-F 8a-5p) Team Phone # [phone] (Nights/Weekends)

## 2015-04-18 LAB — COMPREHENSIVE METABOLIC PANEL
ALT: 534 U/L — ABNORMAL HIGH (ref 17–63)
ANION GAP: 11 (ref 5–15)
AST: 500 U/L — ABNORMAL HIGH (ref 15–41)
Albumin: 2 g/dL — ABNORMAL LOW (ref 3.5–5.0)
Alkaline Phosphatase: 97 U/L (ref 38–126)
BILIRUBIN TOTAL: 0.9 mg/dL (ref 0.3–1.2)
BUN: 47 mg/dL — AB (ref 6–20)
CO2: 20 mmol/L — ABNORMAL LOW (ref 22–32)
Calcium: 8.3 mg/dL — ABNORMAL LOW (ref 8.9–10.3)
Chloride: 115 mmol/L — ABNORMAL HIGH (ref 101–111)
Creatinine, Ser: 2.21 mg/dL — ABNORMAL HIGH (ref 0.61–1.24)
GFR, EST AFRICAN AMERICAN: 31 mL/min — AB (ref >60)
GFR, EST NON AFRICAN AMERICAN: 26 mL/min — AB (ref >60)
Glucose, Bld: 156 mg/dL — ABNORMAL HIGH (ref 65–99)
POTASSIUM: 4.7 mmol/L (ref 3.5–5.1)
Sodium: 146 mmol/L — ABNORMAL HIGH (ref 135–145)
TOTAL PROTEIN: 5.5 g/dL — AB (ref 6.5–8.1)

## 2015-04-18 LAB — CBC
HEMATOCRIT: 26.9 % — AB (ref 39.0–52.0)
HEMOGLOBIN: 8 g/dL — AB (ref 13.0–17.0)
MCH: 24.4 pg — ABNORMAL LOW (ref 26.0–34.0)
MCHC: 29.7 g/dL — AB (ref 30.0–36.0)
MCV: 82 fL (ref 78.0–100.0)
Platelets: 218 10*3/uL (ref 150–400)
RBC: 3.28 MIL/uL — AB (ref 4.22–5.81)
RDW: 25.1 % — ABNORMAL HIGH (ref 11.5–15.5)
WBC: 9.9 10*3/uL (ref 4.0–10.5)

## 2015-04-18 LAB — GLUCOSE, CAPILLARY
GLUCOSE-CAPILLARY: 133 mg/dL — AB (ref 65–99)
GLUCOSE-CAPILLARY: 127 mg/dL — AB (ref 65–99)
Glucose-Capillary: 115 mg/dL — ABNORMAL HIGH (ref 65–99)
Glucose-Capillary: 125 mg/dL — ABNORMAL HIGH (ref 65–99)
Glucose-Capillary: 145 mg/dL — ABNORMAL HIGH (ref 65–99)

## 2015-04-18 LAB — MAGNESIUM: MAGNESIUM: 2.5 mg/dL — AB (ref 1.7–2.4)

## 2015-04-18 LAB — APTT: aPTT: 95 seconds — ABNORMAL HIGH (ref 24–37)

## 2015-04-18 LAB — HEPARIN LEVEL (UNFRACTIONATED): HEPARIN UNFRACTIONATED: 1.05 [IU]/mL — AB (ref 0.30–0.70)

## 2015-04-18 NOTE — Progress Notes (Signed)
Pt taken off of BIPAP for mouth care. RN attempted to place pt on VM, but O2 sats dropped. Pt now on 100%NRB, with O2 sats staying around 93%. RR 36, HR 140, but pt says he is much more comfortable on the NRB than the BIPAP. RT will continue to monitor.

## 2015-04-18 NOTE — Progress Notes (Signed)
Nutrition Brief Note  Pt identified on the </= 12 Braden Score report. Prognosis is extremely poor; more than likely transition to comfort care. Palliative Care Team following. No nutrition interventions warranted at this time.  Please consult as needed.   Arthur Holms, RD, LDN Pager #: [phone] After-Hours Pager #: [phone]

## 2015-04-18 NOTE — Progress Notes (Signed)
Daily Progress Note   Patient Name: Jim Beck       Date: 04/18/2015 DOB: 05-26-1932  Age: 81 y.o. MRN#: [id number] Attending Physician: Reyne Dumas, MD Primary Care Physician: Kandice Hams, MD Admit Date: 04/12/2015  Reason for Consultation/Follow-up: Establishing goals of care  Subjective: [name] continues to appear that he is deteriorating each day. More labored, tachypneic, increasing oxygen demands. I tried to discuss this with him but he insists that he is feeling better. I explained the signs I am seeing and that this worries me - he says he is worried too. I asked him about hospice and if this is something he has thought about. He says no that he would not consider hospice because this is for people who are dying and he doesn't need this. He is very absolute about this. I then asked if he had considered if we couldn't get him better - he became somewhat agitated and tells me that he does not think about this because he is improving. I changed the subject to calm him and he was resting and at ease when I left him.   I also spoke with daughter, Jim Beck, who I spoke with briefly yesterday. I tell her the conversation that I had with her father. She very much understands his situation and wants for him to be comfortable but wants to continue care for now and take this one day at a time. She says that she and her family have been talking about hospice but they cannot take him home. They do not want hospice at this time but do not seem closed off to this as an option. Will need to continue discussions.    Length of Stay: 7 days  Current Medications: Scheduled Meds:  . diltiazem  180 mg Oral Daily  . imipenem-cilastatin  250 mg Intravenous Q6H  . insulin aspart  0-9 Units  Subcutaneous TID WC  . linezolid (ZYVOX) IV  600 mg Intravenous Q12H    Continuous Infusions: . sodium chloride 50 mL/hr at 04/17/15 0901  . heparin 900 Units/hr (04/17/15 1937)    PRN Meds: metoprolol, morphine injection  Physical Exam: Physical Exam  Constitutional: He is oriented to person, place, and time. He appears well-developed.  HENT:  Head: Normocephalic and atraumatic.  Cardiovascular: Normal rate.   Pulmonary/Chest: No  accessory muscle usage. Tachypnea noted. He is in respiratory distress.  Abdominal: Normal appearance.  Neurological: He is alert and oriented to person, place, and time.                Vital Signs: BP 114/95 mmHg  Pulse 121  Temp(Src) 97.5 F (36.4 C) (Axillary)  Resp 39  Ht 5\' 11"  (1.803 m)  Wt 95.5 kg (210 lb 8.6 oz)  BMI 29.38 kg/m2  SpO2 91% SpO2: SpO2: 91 % O2 Device: O2 Device: NRB O2 Flow Rate: O2 Flow Rate (L/min): 15 L/min  Intake/output summary:   Intake/Output Summary (Last 24 hours) at 04/18/15 1031 Last data filed at 04/18/15 0900  Gross per 24 hour  Intake    724 ml  Output    625 ml  Net     99 ml   LBM: Last BM Date: 04/14/15 Baseline Weight: Weight: 83.6 kg (184 lb 4.9 oz) Most recent weight: Weight: 95.5 kg (210 lb 8.6 oz)       Palliative Assessment/Data: Flowsheet Rows        Most Recent Value   Intake Tab    Referral Department  Hospitalist   Unit at Time of Referral  Intermediate Care Unit   Palliative Care Primary Diagnosis  Pulmonary   Date Notified  04/13/15   Palliative Care Type  New Palliative care   Reason for referral  Clarify Goals of Care, Non-pain Symptom   Date of Admission  04/19/2015   Date first seen by Palliative Care  04/14/15   # of days Palliative referral response time  1 Day(s)   # of days IP prior to Palliative referral  2   Clinical Assessment    Palliative Performance Scale Score  30%   Pain Max last 24 hours  0   Pain Min Last 24 hours  0   Dyspnea Max Last 24 Hours  8    Dyspnea Min Last 24 hours  4   Nausea Max Last 24 Hours  0   Nausea Min Last 24 Hours  0   Anxiety Max Last 24 Hours  0   Anxiety Min Last 24 Hours  0   Psychosocial & Spiritual Assessment    Palliative Care Outcomes    Patient/Family meeting held?  Yes   Who was at the meeting?  pt   Palliative Care Outcomes  Improved non-pain symptom therapy, Clarified goals of care   Patient/Family wishes: Interventions discontinued/not started   PEG, Trach, Mechanical Ventilation   Palliative Care follow-up planned  Yes, Facility      Additional Data Reviewed: CBC    Component Value Date/Time   WBC 9.9 04/18/2015 0422   WBC 3.8* 03/27/2015 1002   RBC 3.28* 04/18/2015 0422   RBC 3.57* 03/27/2015 1002   RBC 4.15* 08/01/2009 0440   HGB 8.0* 04/18/2015 0422   HGB 8.3* 03/27/2015 1002   HCT 26.9* 04/18/2015 0422   HCT 27.8* 03/27/2015 1002   PLT 218 04/18/2015 0422   PLT 358 03/27/2015 1002   MCV 82.0 04/18/2015 0422   MCV 77.9* 03/27/2015 1002   MCH 24.4* 04/18/2015 0422   MCH 23.2* 03/27/2015 1002   MCHC 29.7* 04/18/2015 0422   MCHC 29.9* 03/27/2015 1002   RDW 25.1* 04/18/2015 0422   RDW 23.6* 03/27/2015 1002   LYMPHSABS 1.0 03/31/2015 1113   LYMPHSABS 0.9 03/27/2015 1002   MONOABS 1.5* 04/21/2015 1113   MONOABS 0.9 03/27/2015 1002   EOSABS 0.0 04/01/2015 1113  EOSABS 0.0 03/27/2015 1002   BASOSABS 0.0 04/01/2015 1113   BASOSABS 0.0 03/27/2015 1002    CMP     Component Value Date/Time   NA 146* 04/18/2015 0422   NA 141 03/27/2015 1002   K 4.7 04/18/2015 0422   K 4.0 03/27/2015 1002   CL 115* 04/18/2015 0422   CO2 20* 04/18/2015 0422   CO2 25 03/27/2015 1002   GLUCOSE 156* 04/18/2015 0422   GLUCOSE 114 03/27/2015 1002   BUN 47* 04/18/2015 0422   BUN 17.7 03/27/2015 1002   CREATININE 2.21* 04/18/2015 0422   CREATININE 1.3 03/27/2015 1002   CALCIUM 8.3* 04/18/2015 0422   CALCIUM 9.3 03/27/2015 1002   PROT 5.5* 04/18/2015 0422   PROT 6.6 03/27/2015 1002   ALBUMIN 2.0*  04/18/2015 0422   ALBUMIN 2.9* 03/27/2015 1002   AST 500* 04/18/2015 0422   AST 12 03/27/2015 1002   ALT 534* 04/18/2015 0422   ALT <9 03/27/2015 1002   ALKPHOS 97 04/18/2015 0422   ALKPHOS 49 03/27/2015 1002   BILITOT 0.9 04/18/2015 0422   BILITOT 0.43 03/27/2015 1002   GFRNONAA 26* 04/18/2015 0422   GFRAA 31* 04/18/2015 0422       Problem List:  Patient Active Problem List   Diagnosis Date Noted  . COPD exacerbation ([place])   . Paroxysmal atrial fibrillation (H[facility])   . Hodgkin lymphoma of extranodal site excluding spleen and other solid organs ([place])   . Hypokalemia   . Palliative care encounter   . Acute respiratory failure with hypoxia ([place])   . Essential hypertension   . Pulmonary hypertension ([place])   . Dilated cardiomyopathy ([place])   . Hodgkin lymphoma of spleen ([place])   . Acute on chronic renal failure ([place])   . Pneumonia   . Respiratory failure ([place]) 04/23/2015  . Atrial fibrillation ([place])   . Sepsis ([place])   . Protein-calorie malnutrition, severe 04/05/2015  . Weakness 04/04/2015  . Anemia 04/04/2015  . HCAP (healthcare-associated pneumonia) 04/04/2015  . COPD (chronic obstructive pulmonary disease) ([place]) 04/04/2015  . HTN (hypertension) 04/04/2015  . HLD (hyperlipidemia) 04/04/2015  . Hodgkin lymphoma of lymph nodes of axilla ([place]) 03/27/2015  . Constipation 01/30/2015  . Neutropenia ([place]) 01/17/2015  . Encounter for antineoplastic chemotherapy 12/19/2014  . Hodgkin lymphoma ([place]) 11/12/2014  . NHL (non-Hodgkin's lymphoma) ([place]) 07/26/2014  . Personal history of colonic polyps - adenomas 11/30/2013  . GERD with stricture 02/05/2011     Palliative Care Assessment & Plan    1.Code Status:  DNR    Code Status Orders        Start     Ordered   04/01/2015 1358  Do not attempt resuscitation (DNR)   Continuous    Question Answer Comment  Maintain current active treatments Yes   Do not initiate new interventions Yes      04/07/2015 1357       2.  Goals of Care/Additional Recommendations:  Continue aggressive care outside DNR at this time. He tells me he feels like he is improving. I explained that I am very concerned about him. Not open to considering that his prognosis is poor or further limitations of care outside DNR.   Desire for further Chaplaincy support:no  Psycho-social Needs: Caregiving  Support/Resources and Education on Hospice  3. Symptom Management:      1. Continue morphine for dyspnea.       2. Would recommend haldol 1-2 mg IV q6 hour prn for agitation.   4. Palliative  Prophylaxis:   Bowel Regimen, Delirium Protocol and Oral Care  5. Prognosis: Very concerned that prognosis could be days.   6. Discharge Planning:  To be determined.    Thank you for allowing the Palliative Medicine Team to assist in the care of this patient.   Time In: 0940 Time Out: 1000 Total Time 34min Prolonged Time Billed  no         Pershing Proud, NP  [id number], 10:31 AM  Please contact Palliative Medicine Team phone at [phone] for questions and concerns.

## 2015-04-18 NOTE — Progress Notes (Addendum)
[facility] TEAM 1 - Stepdown/ICU TEAM Progress Note  Jim Beck [id number] DOB: Jul 28, 1932 DOA: 04/23/2015 PCP: Kandice Hams, MD  Admit HPI / Brief Narrative: 81yo BM PMHx Depression, former Smoker, COPD, HTN, MI, HLD, remote hx Polysubstance Abuse, Diverticulosis, Colonic Polyps-Adenomatous, Hodgkin's lymphoma (initially dx 1979, recurrence 2011) S/P Chemotherapy with last dose 03/27/15. Just admitted 12/8-12/14 with HCAP, sepsis, discharged on ceftin. Previously lived at home with his wife but d/c to SNF 12/14 r/t significant weakness, debilitation, malnutrition.   On am 12/15 he was found unresponsive at [facility] and brought to [facility] ED. On arrival, O2 sats 64%, BP 84/61, lactate 9.5. Placed on NRB, given IVF and PCCM consulted for ICU admission.   Pt states he is feeling better since arrival. Thinks that this was prompted by him taking off his nasal cannula to clean it and forgetting to put it back on. He c/o productive cough but unable to fully expectorate due to weakness, also c/o malaise and mild SOB. Denies hemoptysis, fevers, chills.    HPI/Subjective: Patient continues to desaturate on BiPAP, currently 87%, speech therapy continues to recommend NPO , blood pressure soft in the 80's   Assessment/Plan: Acute hypoxic respiratory failure/ Sepsis/HCAP worsening desite prior treatment/COPD exacerbation -Titrate O2 to maintain SPO2 89 and 93%, prn BiPAP Repeat chest x-ray shows no change in left base but right basilar opacity appears to be worse -Use BiPAP to help decrease patient's WOB; patient currently on nonrebreather Discontinue antibiotics, patient has been on antibiotics since 12/15, prior to that he was on antibiotics for 7 days -Patient trying to decide on going home on hospice. -Palliative care recommends morphine prn for dyspnea   - HTN  -Currently controlled normal medication monitor  Paroxysmal AFib Was  on diltiazem, IV metoprolol pushes, now on PO cardizem  180 mg /day  Patient received 2 fluid boluses 250 mL each to maintain his blood pressure into the 90s,  Patient currently on a heparin drip as the patient is unable to take eliquis   palliative care to discontinue heparin drip if patient becomes full comfort care    Dilated cardiomyopathy - EF 55% (Echo 04/05/15) -Strict in and out since admission + 6.8 L -Daily weight;              12/ 20 had bed weight= 92.5 kg -Normal CHF medication on hold secondary to hypotension  Pulmonary hypertension  Acute on chronic renal failure ( baseline Scr ~ 1.1) -Hypovolemic component -See dilated cardiomyopathy  Protein calorie malnutrition  NPO   Anemia chronic disease Hemoglobin stable around 8.0,   Hodgkin's lymphoma  - recurrent? S/p chemo (last dose 11/30) -PBS pending pathology read; spoke with Dr. Luvenia Redden pathologist who stated that Dr. Tresa Moore pathologist is the Subject Matter Expert [name]) on reading PBS for signs of lymphoma. Will be in on Monday. Contact pathology for read prior to initiating further workup. -12/18 contacted Oncology to let them know patient was readmitted and ask for consult on possible treatment options -12/20 spoke Dr. Curt Bears oncology and he stated there was no further treatment that could be offered concerning his lymphoma.   Hypokalemia -Potassium goal> 4 -Potassium IV 50 mEq  Hyperglycemia  -Sensitive SSI   Goals of care -Palliative care consult; Patient with MSOF, acute on chronic respiratory failure, with possible recurrent non-Hodgkin's lymphoma. Discuss short-term goals of care vs [name] -12/20 Again concerning the fact that more than likely he will not survive this hospitalization patient trying to decide on  home hospice.    Code Status: DO NOT RESUSCITATE Family Communication: no family present at time of exam Disposition Plan:  were prognosis, nothing by mouth, palliative care to meet with family again tomorrow to  make final recommendations    Consultants: Dr.Daniel Lily Kocher PCCM phone consult Dr. Luvenia Redden pathologist Phone consult Dr. Curt Bears oncology   Procedure/Significant Events: 12/9 echocardiogram;- LVEF= 55% to 60%. - Ventricular septum: contour showed diastolic flattening and systolic flattening. - Right ventricle: moderately to severelydilated. The moderator band was prominent.  - Right atrium:  mildly to moderately dilated. - Tricuspid valve: moderateregurgitation. - PA peak pressure: 54 mm Hg (S).   Culture 12/15 blood right chest Port-A-Cath NGTD 12/15 blood right AC NGTD 12/15 urine negative final 12/15 MRSA by PCR negative   Antibiotics: 12/15 Vanc>>>12/16 12/15zosyn>>>12/16 12/16 Imipenem>>> 12/16 zyvox>>>   DVT prophylaxis: Heparin drip   Devices    LINES / TUBES:      Continuous Infusions: . sodium chloride 50 mL/hr at 04/17/15 0901  . heparin 900 Units/hr (04/17/15 1937)    Objective: VITAL SIGNS: Temp: 96.9 F (36.1 C) (12/22 1200) Temp Source: Axillary (12/22 1200) BP: 116/74 mmHg (12/22 1200) Pulse Rate: 69 (12/22 1200) SPO2; FIO2:  Vent Mode:   Intake/Output Summary (Last 24 hours) at 04/18/15 1407 Last data filed at 04/18/15 0900  Gross per 24 hour  Intake    724 ml  Output    625 ml  Net     99 ml     Exam: General: A/O 4, cachectic, increased WOB, positive acute and chronic respiratory distress (currently on nonrebreather) Eyes: Negative headache,negative scleral hemorrhage ENT: Negative Runny nose, negative ear pain, negative gingival bleeding, Neck:  Negative scars, masses, torticollis, lymphadenopathy, JVD Lungs: significantly tachypneic, diffuse coarse breath sounds with expiratory wheezing, negative crackles  Cardiovascular: Tachycardic, Regular rate and rhythm without murmur gallop or rub normal S1 and S2 Abdomen:negative abdominal pain, nondistended, positive soft, bowel sounds, no rebound, no  ascites, no appreciable mass Extremities: No significant cyanosis, clubbing, or edema bilateral lower extremities Psychiatric:  Negative depression, negative anxiety, negative fatigue, negative mania  Neurologic:  Cranial nerves II through XII intact, tongue/uvula midline, all extremities muscle strength 5/5, sensation intact throughout, negative dysarthria, negative expressive aphasia, negative receptive aphasia.   Data Reviewed: Basic Metabolic Panel:  Recent Labs Lab 04/12/15 0530  04/14/15 0532 04/15/15 0500 04/16/15 0422 04/17/15 0430 04/18/15 0422  NA 138  < > 141 143 144 146* 146*  K 3.5  < > 3.2* 3.8 4.7 4.4 4.7  CL 105  < > 109 112* 116* 115* 115*  CO2 26  < > 25 25 22 22  20*  GLUCOSE 94  < > 119* 128* 133* 140* 156*  BUN 38*  < > 29* 26* 39* 42* 47*  CREATININE 2.21*  < > 1.66* 1.60* 2.39* 2.24* 2.21*  CALCIUM 8.3*  < > 8.0* 8.3* 8.4* 8.5* 8.3*  MG 2.3  < > 2.4 2.4 2.4 2.4 2.5*  PHOS 3.8  --   --   --   --   --   --   < > = values in this interval not displayed. Liver Function Tests:  Recent Labs Lab 04/14/15 0532 04/15/15 0500 04/16/15 0422 04/17/15 0430 04/18/15 0422  AST 144* 60* 864* 431* 500*  ALT 432* 293* 578* 507* 534*  ALKPHOS 86 83 122 102 97  BILITOT 0.6 0.8 0.6 0.7 0.9  PROT 5.1* 5.2* 5.4* 5.2* 5.5*  ALBUMIN  1.9* 2.0* 2.0* 2.0* 2.0*   No results for input(s): LIPASE, AMYLASE in the last 168 hours. No results for input(s): AMMONIA in the last 168 hours. CBC:  Recent Labs Lab 04/14/15 0532 04/15/15 0500 04/16/15 0422 04/17/15 0430 04/18/15 0422  WBC 7.2 7.3 9.5 9.1 9.9  HGB 8.1* 8.0* 8.0* 7.7* 8.0*  HCT 27.0* 27.5* 27.3* 26.1* 26.9*  MCV 80.6 81.1 82.5 82.6 82.0  PLT 343 328 319 270 218   Cardiac Enzymes:  Recent Labs Lab 04/13/2015 2020 04/12/15 0100  TROPONINI 0.34* 0.24*   BNP (last 3 results)  Recent Labs  04/17/2015 1113  BNP 523.3*    ProBNP (last 3 results) No results for input(s): PROBNP in the last 8760  hours.  CBG:  Recent Labs Lab 04/17/15 1621 04/17/15 2053 04/18/15 0647 04/18/15 0834 04/18/15 1311  GLUCAP 114* 118* 133* 115* 127*    Recent Results (from the past 240 hour(s))  Culture, blood (Routine X 2) w Reflex to ID Panel     Status: None   Collection Time: 04/07/2015 11:40 AM  Result Value Ref Range Status   Specimen Description BLOOD RIGHT CHEST PORTA CATH  Final   Special Requests BOTTLES DRAWN AEROBIC AND ANAEROBIC 5CC  Final   Culture NO GROWTH 5 DAYS  Final   Report Status 04/16/2015 FINAL  Final  Culture, blood (Routine X 2) w Reflex to ID Panel     Status: None   Collection Time: 03/28/2015 11:45 AM  Result Value Ref Range Status   Specimen Description BLOOD RIGHT ANTECUBITAL  Final   Special Requests BOTTLES DRAWN AEROBIC AND ANAEROBIC 10CC  Final   Culture NO GROWTH 5 DAYS  Final   Report Status 04/16/2015 FINAL  Final  Urine culture     Status: None   Collection Time: 04/07/2015 12:00 PM  Result Value Ref Range Status   Specimen Description URINE, CATHETERIZED  Final   Special Requests NONE  Final   Culture NO GROWTH 1 DAY  Final   Report Status 04/12/2015 FINAL  Final  MRSA PCR Screening     Status: None   Collection Time: 04/13/2015  9:25 PM  Result Value Ref Range Status   MRSA by PCR NEGATIVE NEGATIVE Final    Comment:        The GeneXpert MRSA Assay (FDA approved for NASAL specimens only), is one component of a comprehensive MRSA colonization surveillance program. It is not intended to diagnose MRSA infection nor to guide or monitor treatment for MRSA infections.      Studies:  Recent x-ray studies have been reviewed in detail by the Attending Physician  Scheduled Meds:  Scheduled Meds: . diltiazem  180 mg Oral Daily  . insulin aspart  0-9 Units Subcutaneous TID WC    Time spent on care of this patient: 12 mins   Reyne Dumas , MD  [facility] Office  [phone] Pager - [phone]  On-Call/Text Page:      [email or web address]       password [email or web address]  If 7PM-7AM, please contact night-coverage [email or web address] Password [email or web address] 04/18/2015, 2:07 PM   LOS: 7 days   Care during the described time interval was provided by me .  I have reviewed this patient's available data, including medical history, events of note, physical examination, and all test results as part of my evaluation. I have personally reviewed and interpreted all radiology studies.

## 2015-04-18 NOTE — Progress Notes (Signed)
Chart review indicates that while Palliative Care has met with patient- he is still uncertain as to whether to seek home hospice vs comfort care vs more aggressive care.  Current resident of [facility] and can return there with palliative care if indicated.  MD note states possible hospital death if he does not improve.  CSW services will continue to monitor and assist as indicated.  Lorie Phenix. Pauline Good, [place]

## 2015-04-18 NOTE — Progress Notes (Addendum)
ANTICOAGULATION CONSULT NOTE - Follow-up Consult  Pharmacy Consult for Heparin Indication: atrial fibrillation  Allergies  Allergen Reactions  . Aspirin     325mg  gives him shakes but low dose 81mg  is fine   Patient Measurements: Height: 5\' 11"  (180.3 cm) Weight: 210 lb 8.6 oz (95.5 kg) IBW/kg (Calculated) : 75.3 Heparin Dosing Weight: 83.6kg  Vital Signs: Temp: 97.2 F (36.2 C) (12/22 0400) Temp Source: Axillary (12/22 0400) BP: 118/75 mmHg (12/22 0600) Pulse Rate: 93 (12/22 0600)  Labs:  Recent Labs  04/16/15 0422 04/17/15 0430 04/17/15 1712 04/18/15 0422 04/18/15 0500  HGB 8.0* 7.7*  --  8.0*  --   HCT 27.3* 26.1*  --  26.9*  --   PLT 319 270  --  218  --   APTT  --  106* 101* 95*  --   HEPARINUNFRC  --  0.94* 1.06*  --  1.05*  CREATININE 2.39* 2.24*  --  2.21*  --     Estimated Creatinine Clearance: 31.4 mL/min (by C-G formula based on Cr of 2.21).  . sodium chloride 50 mL/hr at 04/17/15 0901  . heparin 900 Units/hr (04/17/15 1937)   Assessment: 50 yoM previously on Eliquis, now on IV heparin for afib. aPTT is now within goal range at 95 seconds.  Heparin level remains elevated from Eliquis effect.  Would expect this to trend downward in the next 48-72 hours. Currently dosing based on the aPTT level. No bleeding reported. CBC remains low-stable.   Goal of Therapy:  Heparin level 0.3-0.7 units/ml aPTT 66-102 seconds Monitor platelets by anticoagulation protocol: Yes   Plan:  - Continue IV heparin at 900 units/hr. - Daily HL, aPTT and CBC  Sloan Leiter, PharmD, BCPS Clinical Pharmacist [phone] Thank you for allowing pharmacy to be part of this patients care team. 04/18/2015 7:21 AM

## 2015-04-18 NOTE — Progress Notes (Signed)
Speech Language Pathology Treatment: Dysphagia  Patient Details Name: Jim Beck MRN: [id number] DOB: 11-15-1932 Today's Date: 04/18/2015 Time: [id number] SLP Time Calculation (min) (ACUTE ONLY): 11 min  Assessment / Plan / Recommendation Clinical Impression  Pt on NRB this morning after requesting a break from BiPAP. RR hovering in the low 30s, SpO2 in the low 90s. Per RN, pt has been requesting water. Ice chip trials presented did result in a delayed cough following completion of trials. Rest breaks were taken PRN due to elevation of RR up to 38. RN present and administered one bolus of puree for medication administration with no overt difficulties observed. Reviewed with pt that at this time he is at a high aspiration risk with all POs due to his respiratory needs; however, pending [place] conversation with palliative care team, pt may be appropriate for comfort feeds given his frequent request for water. Will continue to follow along.   HPI HPI: 81yo male former smoker with hx COPD, HTN, remote hx polysubstance abuse, Hodgkin's lymphoma (initially dx 1979, recurrence 2011) s/p chemotherapy with last dose 03/27/15. Just admitted 12/8-12/14 with HCAP, sepsis, discharged on ceftin. Previously lived at home with his wife but d/c to SNF 12/14 r/t significant weakness, debilitation, malnutrition. On am 12/15 he was found unresponsive at [facility] and brought to [facility] ED.      SLP Plan  Continue with current plan of care     Recommendations  Diet recommendations: NPO Medication Administration: Crushed with puree       Oral Care Recommendations: Oral care QID Follow up Recommendations:  (tba - palliative care meeting pending) Plan: Continue with current plan of care   [name], M.A. CCC-SLP [phone]  [name] 04/18/2015, 10:27 AM

## 2015-04-19 LAB — GLUCOSE, CAPILLARY
GLUCOSE-CAPILLARY: 117 mg/dL — AB (ref 65–99)
GLUCOSE-CAPILLARY: 141 mg/dL — AB (ref 65–99)
Glucose-Capillary: 135 mg/dL — ABNORMAL HIGH (ref 65–99)
Glucose-Capillary: 117 mg/dL — ABNORMAL HIGH (ref 65–99)
Glucose-Capillary: 129 mg/dL — ABNORMAL HIGH (ref 65–99)
Glucose-Capillary: 129 mg/dL — ABNORMAL HIGH (ref 65–99)

## 2015-04-19 LAB — MAGNESIUM: Magnesium: 2.6 mg/dL — ABNORMAL HIGH (ref 1.7–2.4)

## 2015-04-19 LAB — BLOOD GAS, ARTERIAL
ACID-BASE DEFICIT: 6.7 mmol/L — AB (ref 0.0–2.0)
BICARBONATE: 18.2 meq/L — AB (ref 20.0–24.0)
Delivery systems: POSITIVE
Drawn by: 313941
EXPIRATORY PAP: 5
FIO2: 0.6
Inspiratory PAP: 15
O2 SAT: 86.8 %
PATIENT TEMPERATURE: 97.4
PH ART: 7.335 — AB (ref 7.350–7.450)
PO2 ART: 56.7 mmHg — AB (ref 80.0–100.0)
TCO2: 19.3 mmol/L (ref 0–100)
pCO2 arterial: 34.8 mmHg — ABNORMAL LOW (ref 35.0–45.0)

## 2015-04-19 LAB — CBC
HEMATOCRIT: 27 % — AB (ref 39.0–52.0)
Hemoglobin: 7.9 g/dL — ABNORMAL LOW (ref 13.0–17.0)
MCH: 24.6 pg — ABNORMAL LOW (ref 26.0–34.0)
MCHC: 29.3 g/dL — ABNORMAL LOW (ref 30.0–36.0)
MCV: 84.1 fL (ref 78.0–100.0)
Platelets: 191 10*3/uL (ref 150–400)
RBC: 3.21 MIL/uL — AB (ref 4.22–5.81)
RDW: 26 % — ABNORMAL HIGH (ref 11.5–15.5)
WBC: 13.1 10*3/uL — AB (ref 4.0–10.5)

## 2015-04-19 LAB — APTT: aPTT: 78 seconds — ABNORMAL HIGH (ref 24–37)

## 2015-04-19 LAB — HEPARIN LEVEL (UNFRACTIONATED): Heparin Unfractionated: 1.03 IU/mL — ABNORMAL HIGH (ref 0.30–0.70)

## 2015-04-19 MED ORDER — DILTIAZEM HCL ER COATED BEADS 120 MG PO CP24: 120.0000 mg | ORAL_CAPSULE | Freq: Every day | ORAL | Status: DC

## 2015-04-19 NOTE — Progress Notes (Addendum)
SLP Cancellation Note  Patient Details Name: Jim Beck MRN: [id number] DOB: 03/20/1933   Cancelled treatment:       Reason Eval/Treat Not Completed: Medical issues which prohibited therapy. Pt currently on BiPAP with SpO2 at 90% - per palliative care note, pt is not agreeable to comfort care at this time. Will continue to follow and assist as able.   [name], M.A. CCC-SLP [phone]  [name] 04/19/2015, 9:30 AM

## 2015-04-19 NOTE — Progress Notes (Signed)
ABG results sent via text page to MD Abrol per order request. No new orders received, will continue to monitor patient. Urban Gibson Lexine Baton

## 2015-04-19 NOTE — Care Management Important Message (Signed)
Important Message  Patient Details  Name: Jim Beck MRN: [id number] Date of Birth: Sep 03, 1932   Medicare Important Message Given:  Yes    Nathen May 04/19/2015, 3:29 PM

## 2015-04-19 NOTE — Progress Notes (Signed)
ANTICOAGULATION CONSULT NOTE - Follow-up Consult  Pharmacy Consult for Heparin Indication: atrial fibrillation  Allergies  Allergen Reactions  . Aspirin     325mg  gives him shakes but low dose 81mg  is fine   Patient Measurements: Height: 5\' 11"  (180.3 cm) Weight: 209 lb 3.5 oz (94.9 kg) IBW/kg (Calculated) : 75.3 Heparin Dosing Weight: 83.6kg  Vital Signs: Temp: 97.4 F (36.3 C) (12/23 0800) Temp Source: Axillary (12/23 0800) BP: 107/65 mmHg (12/23 1114) Pulse Rate: 91 (12/23 1114)  Labs:  Recent Labs  04/17/15 0430 04/17/15 1712 04/18/15 0422 04/18/15 0500 04/19/15 0532  HGB 7.7*  --  8.0*  --  7.9*  HCT 26.1*  --  26.9*  --  27.0*  PLT 270  --  218  --  191  APTT 106* 101* 95*  --  78*  HEPARINUNFRC 0.94* 1.06*  --  1.05* 1.03*  CREATININE 2.24*  --  2.21*  --   --     Estimated Creatinine Clearance: 31.3 mL/min (by C-G formula based on Cr of 2.21).  . sodium chloride 50 mL/hr at 04/18/15 1927  . heparin 900 Units/hr (04/18/15 1927)   Assessment: 89 yoM previously on Eliquis, now on IV heparin for afib. aPTT is now within goal range at 78 seconds.  Heparin level remains elevated from Eliquis effect in setting of renal dysfunction. Currently dosing based on the aPTT level. No bleeding reported. CBC remains low-stable.   Goal of Therapy:  Heparin level 0.3-0.7 units/ml aPTT 66-102 seconds Monitor platelets by anticoagulation protocol: Yes   Plan:  - Continue IV heparin at 900 units/hr. - Daily HL, aPTT and CBC  Sloan Leiter, PharmD, BCPS Clinical Pharmacist [phone] Thank you for allowing pharmacy to be part of this patients care team. 04/19/2015 11:54 AM

## 2015-04-19 NOTE — Progress Notes (Addendum)
[place] TEAM 1 - Stepdown/ICU TEAM Progress Note  Jim Beck [id number] DOB: [birth year] DOA: 04/03/2015 PCP: Kandice Hams, MD  Admit HPI / Brief Narrative: 81yo BM PMHx Depression, former Smoker, COPD, HTN, MI, HLD, remote hx Polysubstance Abuse, Diverticulosis, Colonic Polyps-Adenomatous, Hodgkin's lymphoma (initially dx 1979, recurrence 2011) S/P Chemotherapy with last dose 03/27/15. Just admitted 12/8-12/14 with HCAP, sepsis, discharged on ceftin. Previously lived at home with his wife but d/c to SNF 12/14 r/t significant weakness, debilitation, malnutrition.   On am 12/15 he was found unresponsive at [facility] and brought to [facility] ED. On arrival, O2 sats 64%, BP 84/61, lactate 9.5. Placed on NRB, given IVF and PCCM consulted for ICU admission.   Pt states he is feeling better since arrival. Thinks that this was prompted by him taking off his nasal cannula to clean it and forgetting to put it back on. He c/o productive cough but unable to fully expectorate due to weakness, also c/o malaise and mild SOB. Denies hemoptysis, fevers, chills.    HPI/Subjective: More labored, tachypneic, increasing oxygen demands, refused hospice Patient noted to have bradycardia overnight, on BiPAP this morning  Assessment/Plan: Acute hypoxic respiratory failure/ Sepsis/HCAP worsening desite prior treatment/COPD exacerbation -Titrate O2 to maintain SPO2 89 and 93%, prn BiPAP,   Repeat chest x-ray shows no change in left base but right basilar opacity appears to be worse, suspect recurrent aspiration Continue BiPAP to help decrease patient's WOB;   Antibiotics discontinued, patient has been on antibiotics since 12/15, prior to that he was on antibiotics for 7 days -Patient refused  hospice. -Palliative care recommends morphine prn for dyspnea NPO  - HTN  -Currently controlled normal medication monitor  Paroxysmal AFib Was  on diltiazem, IV metoprolol pushes, now on PO cardizem 180 mg /day,  bradycardic this morning, switched to Cardizem 120 mg a day  Patient received 2 fluid boluses 250 mL each to maintain his blood pressure into the 90s,  Patient currently on a heparin drip as the patient is unable to take eliquis   palliative care to discontinue heparin drip if patient becomes full comfort care    Dilated cardiomyopathy - EF 55% (Echo 04/05/15) -Strict in and out since admission + 6.8 L -Daily weight;              12/ 20 had bed weight= 92.5 kg -Normal CHF medication on hold secondary to hypotension  Pulmonary hypertension  Acute on chronic renal failure ( baseline Scr ~ 1.1) -Hypovolemic component -See dilated cardiomyopathy  Protein calorie malnutrition  NPO    Anemia chronic disease Hemoglobin stable around 8.0,   Hodgkin's lymphoma  - recurrent? S/p chemo (last dose 11/30) -PBS pending pathology read; spoke with Dr. Luvenia Redden pathologist who stated that Dr. Tresa Moore pathologist is the Subject Matter Expert [facility]) on reading PBS for signs of lymphoma. Will be in on Monday. Contact pathology for read prior to initiating further workup. -12/18 contacted Oncology to let them know patient was readmitted and ask for consult on possible treatment options -12/20 spoke Dr. Curt Bears oncology and he stated there was no further treatment that could be offered concerning his lymphoma.   Hypokalemia -Potassium goal> 4 -Potassium IV 50 mEq  Hyperglycemia  -Sensitive SSI   Goals of care -Palliative care consult; Patient with MSOF, acute on chronic respiratory failure, with possible recurrent non-Hodgkin's lymphoma. Discuss short-term goals of care vs Haber-term goals care -12/20 Again concerning the fact that more than likely he will not survive this hospitalization .  Declined hospice  Code Status: DO NOT RESUSCITATE Family Communication: no family present at time of exam Disposition Plan:  were prognosis, nothing by mouth, palliative care to meet with family  again tomorrow to make final recommendations    Consultants: Dr.Daniel Lily Kocher PCCM phone consult Dr. Luvenia Redden pathologist Phone consult Dr. Curt Bears oncology   Procedure/Significant Events: 12/9 echocardiogram;- LVEF= 55% to 60%. - Ventricular septum: contour showed diastolic flattening and systolic flattening. - Right ventricle: moderately to severelydilated. The moderator band was prominent.  - Right atrium:  mildly to moderately dilated. - Tricuspid valve: moderateregurgitation. - PA peak pressure: 54 mm Hg (S).   Culture 12/15 blood right chest Port-A-Cath NGTD 12/15 blood right AC NGTD 12/15 urine negative final 12/15 MRSA by PCR negative   Antibiotics: 12/15 Vanc>>>12/16 12/15zosyn>>>12/16 12/16 Imipenem>>> 12/16 zyvox>>>   DVT prophylaxis: Heparin drip   Devices    LINES / TUBES:      Continuous Infusions: . sodium chloride 50 mL/hr at 04/18/15 1927  . heparin 900 Units/hr (04/18/15 1927)    Objective: VITAL SIGNS: Temp: 97.4 F (36.3 C) (12/23 0800) Temp Source: Axillary (12/23 0800) BP: 108/69 mmHg (12/23 0957) Pulse Rate: 45 (12/23 0800) SPO2; FIO2:  Vent Mode:   Intake/Output Summary (Last 24 hours) at 04/19/15 1031 Last data filed at 04/19/15 0600  Gross per 24 hour  Intake   1580 ml  Output    600 ml  Net    980 ml     Exam: General: A/O 4, cachectic, increased WOB, positive acute and chronic respiratory distress (currently on nonrebreather) Eyes: Negative headache,negative scleral hemorrhage ENT: Negative Runny nose, negative ear pain, negative gingival bleeding, Neck:  Negative scars, masses, torticollis, lymphadenopathy, JVD Lungs: significantly tachypneic, diffuse coarse breath sounds with expiratory wheezing, negative crackles  Cardiovascular: Tachycardic, Regular rate and rhythm without murmur gallop or rub normal S1 and S2 Abdomen:negative abdominal pain, nondistended, positive soft, bowel sounds,  no rebound, no ascites, no appreciable mass Extremities: No significant cyanosis, clubbing, or edema bilateral lower extremities Psychiatric:  Negative depression, negative anxiety, negative fatigue, negative mania  Neurologic:  Cranial nerves II through XII intact, tongue/uvula midline, all extremities muscle strength 5/5, sensation intact throughout, negative dysarthria, negative expressive aphasia, negative receptive aphasia.   Data Reviewed: Basic Metabolic Panel:  Recent Labs Lab 04/14/15 0532 04/15/15 0500 04/16/15 0422 04/17/15 0430 04/18/15 0422 04/19/15 0532  NA 141 143 144 146* 146*  --   K 3.2* 3.8 4.7 4.4 4.7  --   CL 109 112* 116* 115* 115*  --   CO2 25 25 22 22  20*  --   GLUCOSE 119* 128* 133* 140* 156*  --   BUN 29* 26* 39* 42* 47*  --   CREATININE 1.66* 1.60* 2.39* 2.24* 2.21*  --   CALCIUM 8.0* 8.3* 8.4* 8.5* 8.3*  --   MG 2.4 2.4 2.4 2.4 2.5* 2.6*   Liver Function Tests:  Recent Labs Lab 04/14/15 0532 04/15/15 0500 04/16/15 0422 04/17/15 0430 04/18/15 0422  AST 144* 60* 864* 431* 500*  ALT 432* 293* 578* 507* 534*  ALKPHOS 86 83 122 102 97  BILITOT 0.6 0.8 0.6 0.7 0.9  PROT 5.1* 5.2* 5.4* 5.2* 5.5*  ALBUMIN 1.9* 2.0* 2.0* 2.0* 2.0*   No results for input(s): LIPASE, AMYLASE in the last 168 hours. No results for input(s): AMMONIA in the last 168 hours. CBC:  Recent Labs Lab 04/15/15 0500 04/16/15 0422 04/17/15 0430 04/18/15 0422 04/19/15 0532  WBC  7.3 9.5 9.1 9.9 13.1*  HGB 8.0* 8.0* 7.7* 8.0* 7.9*  HCT 27.5* 27.3* 26.1* 26.9* 27.0*  MCV 81.1 82.5 82.6 82.0 84.1  PLT 328 319 270 218 191   Cardiac Enzymes: No results for input(s): CKTOTAL, CKMB, CKMBINDEX, TROPONINI in the last 168 hours. BNP (last 3 results)  Recent Labs  04/06/2015 1113  BNP 523.3*    ProBNP (last 3 results) No results for input(s): PROBNP in the last 8760 hours.  CBG:  Recent Labs Lab 04/18/15 1311 04/18/15 1625 04/18/15 2019 04/19/15 0412 04/19/15 0801   GLUCAP 127* 125* 145* 135* 117*    Recent Results (from the past 240 hour(s))  Culture, blood (Routine X 2) w Reflex to ID Panel     Status: None   Collection Time: 04/03/2015 11:40 AM  Result Value Ref Range Status   Specimen Description BLOOD RIGHT CHEST PORTA CATH  Final   Special Requests BOTTLES DRAWN AEROBIC AND ANAEROBIC 5CC  Final   Culture NO GROWTH 5 DAYS  Final   Report Status 04/16/2015 FINAL  Final  Culture, blood (Routine X 2) w Reflex to ID Panel     Status: None   Collection Time: 04/20/2015 11:45 AM  Result Value Ref Range Status   Specimen Description BLOOD RIGHT ANTECUBITAL  Final   Special Requests BOTTLES DRAWN AEROBIC AND ANAEROBIC 10CC  Final   Culture NO GROWTH 5 DAYS  Final   Report Status 04/16/2015 FINAL  Final  Urine culture     Status: None   Collection Time: 04/20/2015 12:00 PM  Result Value Ref Range Status   Specimen Description URINE, CATHETERIZED  Final   Special Requests NONE  Final   Culture NO GROWTH 1 DAY  Final   Report Status 04/12/2015 FINAL  Final  MRSA PCR Screening     Status: None   Collection Time: 04/21/2015  9:25 PM  Result Value Ref Range Status   MRSA by PCR NEGATIVE NEGATIVE Final    Comment:        The GeneXpert MRSA Assay (FDA approved for NASAL specimens only), is one component of a comprehensive MRSA colonization surveillance program. It is not intended to diagnose MRSA infection nor to guide or monitor treatment for MRSA infections.      Studies:  Recent x-ray studies have been reviewed in detail by the Attending Physician  Scheduled Meds:  Scheduled Meds: . diltiazem  180 mg Oral Daily  . insulin aspart  0-9 Units Subcutaneous TID WC    Time spent on care of this patient: 66 mins   Reyne Dumas , MD  [facility] Office  [phone] Pager - [phone]  On-Call/Text Page:      [email or web address]      password [email or web address]  If 7PM-7AM, please contact night-coverage [email or web address] Password [email or web address] 04/19/2015,  10:31 AM   LOS: 8 days   Care during the described time interval was provided by me .  I have reviewed this patient's available data, including medical history, events of note, physical examination, and all test results as part of my evaluation. I have personally reviewed and interpreted all radiology studies.

## 2015-04-20 LAB — BLOOD GAS, ARTERIAL
Acid-base deficit: 4.8 mmol/L — ABNORMAL HIGH (ref 0.0–2.0)
BICARBONATE: 20.3 meq/L (ref 20.0–24.0)
DRAWN BY: 313941
Delivery systems: POSITIVE
Expiratory PAP: 5
FIO2: 1
INSPIRATORY PAP: 15
O2 SAT: 96.2 %
PATIENT TEMPERATURE: 97.4
PCO2 ART: 39.7 mmHg (ref 35.0–45.0)
PO2 ART: 93.7 mmHg (ref 80.0–100.0)
RATE: 8 resp/min
TCO2: 21.6 mmol/L (ref 0–100)
pH, Arterial: 7.325 — ABNORMAL LOW (ref 7.350–7.450)

## 2015-04-20 LAB — CBC
HCT: 27.3 % — ABNORMAL LOW (ref 39.0–52.0)
Hemoglobin: 8 g/dL — ABNORMAL LOW (ref 13.0–17.0)
MCH: 24.2 pg — AB (ref 26.0–34.0)
MCHC: 29.3 g/dL — AB (ref 30.0–36.0)
MCV: 82.7 fL (ref 78.0–100.0)
PLATELETS: 146 10*3/uL — AB (ref 150–400)
RBC: 3.3 MIL/uL — ABNORMAL LOW (ref 4.22–5.81)
RDW: 26.2 % — AB (ref 11.5–15.5)
WBC: 13.1 10*3/uL — ABNORMAL HIGH (ref 4.0–10.5)

## 2015-04-20 LAB — HEPARIN LEVEL (UNFRACTIONATED): Heparin Unfractionated: 0.86 IU/mL — ABNORMAL HIGH (ref 0.30–0.70)

## 2015-04-20 LAB — GLUCOSE, CAPILLARY
GLUCOSE-CAPILLARY: 118 mg/dL — AB (ref 65–99)
GLUCOSE-CAPILLARY: 114 mg/dL — AB (ref 65–99)
Glucose-Capillary: 133 mg/dL — ABNORMAL HIGH (ref 65–99)
Glucose-Capillary: 118 mg/dL — ABNORMAL HIGH (ref 65–99)
Glucose-Capillary: 107 mg/dL — ABNORMAL HIGH (ref 65–99)

## 2015-04-20 LAB — MAGNESIUM: Magnesium: 2.8 mg/dL — ABNORMAL HIGH (ref 1.7–2.4)

## 2015-04-20 LAB — APTT: APTT: 82 s — AB (ref 24–37)

## 2015-04-20 MED ORDER — CHLORHEXIDINE GLUCONATE 0.12 % MT SOLN
15.0000 mL | Freq: Two times a day (BID) | OROMUCOSAL | Status: DC
  Administered 2015-04-20 – 2015-04-21 (×3): 15 mL via OROMUCOSAL

## 2015-04-20 MED ORDER — DILTIAZEM HCL 30 MG PO TABS
30.0000 mg | ORAL_TABLET | Freq: Three times a day (TID) | ORAL | Status: DC
  Administered 2015-04-20 (×2): 30 mg via ORAL
  Filled 2015-04-20 (×3): qty 1

## 2015-04-20 MED ORDER — CETYLPYRIDINIUM CHLORIDE 0.05 % MT LIQD
7.0000 mL | Freq: Two times a day (BID) | OROMUCOSAL | Status: DC
  Administered 2015-04-20 – 2015-04-21 (×4): 7 mL via OROMUCOSAL

## 2015-04-20 MED ORDER — MORPHINE SULFATE (PF) 2 MG/ML IV SOLN
2.0000 mg | INTRAVENOUS | Status: DC | PRN
  Administered 2015-04-20: 4 mg via INTRAVENOUS
  Administered 2015-04-21 (×3): 2 mg via INTRAVENOUS
  Administered 2015-04-21: 4 mg via INTRAVENOUS
  Filled 2015-04-20: qty 1
  Filled 2015-04-20: qty 2
  Filled 2015-04-20 (×2): qty 1
  Filled 2015-04-20: qty 2
  Filled 2015-04-20: qty 1

## 2015-04-20 NOTE — Progress Notes (Signed)
Patient off BIPAP for oral care and meds.Placed on Non re breather sats 88-90%. Daughter jennifer visiting and agrees that patient looks more comfortable without the BiPAP.Medicated with morphine for comfort.Will try off BIPAP for now

## 2015-04-20 NOTE — Progress Notes (Signed)
ANTICOAGULATION CONSULT NOTE - Follow-up Consult  Pharmacy Consult for : HIT alert  Allergies  Allergen Reactions  . Aspirin     325mg  gives him shakes but low dose 81mg  is fine   Patient Measurements: Height: 5\' 11"  (180.3 cm) Weight: 203 lb 14.8 oz (92.5 kg) IBW/kg (Calculated) : 75.3 Heparin Dosing Weight: 83.6kg  Vital Signs: Temp: 97.4 F (36.3 C) (12/24 0854) Temp Source: Axillary (12/24 0854) BP: 111/69 mmHg (12/24 0900) Pulse Rate: 47 (12/24 0900)  Labs:  Recent Labs  04/18/15 0422 04/18/15 0500 04/19/15 0532 04/20/15 0442  HGB 8.0*  --  7.9* 8.0*  HCT 26.9*  --  27.0* 27.3*  PLT 218  --  191 146*  APTT 95*  --  78* 82*  HEPARINUNFRC  --  1.05* 1.03* 0.86*  CREATININE 2.21*  --   --   --     Estimated Creatinine Clearance: 31 mL/min (by C-G formula based on Cr of 2.21).  . sodium chloride 50 mL/hr at 04/18/15 1927   Assessment: 58 yoM previously on Eliquis, currentlyl on IV heparin for afib. Heparin stopped today due to concern for HIT with plct drop to 146. No bleeding reported.    Plan:  - Heparin stopped 2nd dropping PLTC, concern for HIT -HIT ab ordered by MD - DC heparin labs (aPTT/HL/CBC)  Eudelia Bunch, Pharm.D. BP:[phone] 04/20/2015 10:24 AM

## 2015-04-20 NOTE — Progress Notes (Signed)
Placed patient on BiPAP on settings of 18/8 and 100% FiO2 due to patient desaturation. Patient is at 91% and tolerating it fairly well. RT will continue to monitor.

## 2015-04-20 NOTE — Progress Notes (Signed)
[place] TEAM 1 - Stepdown/ICU TEAM Progress Note  Jim Beck [id number] DOB: 1933-02-13 DOA: 04/21/2015 PCP: Kandice Hams, MD  Admit HPI / Brief Narrative: 81yo BM PMHx Depression, former Smoker, COPD, HTN, MI, HLD, remote hx Polysubstance Abuse, Diverticulosis, Colonic Polyps-Adenomatous, Hodgkin's lymphoma (initially dx 1979, recurrence 2011) S/P Chemotherapy with  last dose 03/27/15. Just admitted 12/8-12/14 with HCAP, sepsis, discharged on ceftin. Previously lived at home with his wife but d/c to SNF 12/14 r/t significant weakness, debilitation, malnutrition.   On am 12/15 he was found unresponsive at [facility] and brought to [facility] ED. On arrival, O2 sats 64%, BP 84/61, lactate 9.5. Placed on NRB, given IVF and PCCM consulted for ICU admission.   Pt states he is feeling better since arrival. Thinks that this was prompted by him taking off his nasal cannula to clean it and forgetting to put it back on. He c/o productive cough but unable to fully expectorate due to weakness, also c/o malaise and mild SOB. Denies hemoptysis, fevers, chills.    HPI/Subjective: More labored, tachypneic, currently on 100% FiO2, discussed with the patient's spouse, made her aware that patient can decompensate and possibly expire over the weekend.   Assessment/Plan: Acute hypoxic respiratory failure/ Sepsis/HCAP worsening desite prior treatment/COPD exacerbation Has not been able to wean off the BiPAP for several days now, FiO2 requirements increasing by the day  Repeat chest x-ray shows no change in left base but right basilar opacity appears to be worse, suspect recurrent aspiration , this cannot be prevented even with strict aspiration precautions, and the fact that the patient is on the BiPAP, it would be hard to prevent recurrent aspiration Patient is mentating well Continue BiPAP   Antibiotics discontinued, patient has been on antibiotics since 12/15, prior to that he was on antibiotics for 7  days, continuing antibiotics would be futile at this time, discussed with the patient's wife -Patient refused  hospice. Patient remains NPO  - HTN  -Currently controlled normal medication monitor  Paroxysmal AFib Was  on diltiazem, IV metoprolol pushes, now on PO cardizem 180 mg /day, bradycardic again this morning, discontinue Cardizem CD, will use 30 mg of diltiazem Q8HRS   unable to take eliquis as the patient is NPO, heparin drip discontinued because of decreasing platelet count      Dilated cardiomyopathy - EF 55% (Echo 04/05/15) -Strict in and out since admission + 6.8 L -Daily weight;              12/ 20 had bed weight= 92.5 kg -Normal CHF medication on hold secondary to hypotension  Pulmonary hypertension  Acute on chronic renal failure ( baseline Scr ~ 1.1) -Hypovolemic component -See dilated cardiomyopathy  Protein calorie malnutrition  Nothing by mouth  Anemia chronic disease Hemoglobin stable around 8.0,   Hodgkin's lymphoma  - recurrent? S/p chemo (last dose 11/30) -12/18 contacted Oncology to let them know patient was readmitted and ask for consult on possible treatment options -12/20 spoke Dr. Curt Bears oncology and he stated there was no further treatment that could be offered concerning his lymphoma.   Hypokalemia -Potassium goal> 4 -Potassium IV 50 mEq  Hyperglycemia  -Sensitive SSI   Goals of care -Palliative care consult; Patient with MSOF, acute on chronic respiratory failure, with possible recurrent non-Hodgkin's lymphoma. Continue palliative care discussions -12/20 Again concerning the fact that more than likely he will not survive this hospitalization . Declined hospice  Code Status: DO NOT RESUSCITATE Family Communication: no family present at time of  exam Disposition Plan:  Poor  prognosis, nothing by mouth,  Will not survive this hospitalization, called  spouse and discussed as above    Consultants: Dr.Daniel Lily Kocher  PCCM phone consult Dr. Luvenia Redden pathologist Phone consult Dr. Curt Bears oncology   Procedure/Significant Events: 12/9 echocardiogram;- LVEF= 55% to 60%. - Ventricular septum: contour showed diastolic flattening and systolic flattening. - Right ventricle: moderately to severelydilated. The moderator band was prominent.  - Right atrium:  mildly to moderately dilated. - Tricuspid valve: moderateregurgitation. - PA peak pressure: 54 mm Hg (S).   Culture 12/15 blood right chest Port-A-Cath NGTD 12/15 blood right AC NGTD 12/15 urine negative final 12/15 MRSA by PCR negative   Antibiotics: 12/15 Vanc>>>12/16 12/15zosyn>>>12/16 12/16 Imipenem>>> 12/16 zyvox>>>   DVT prophylaxis: Heparin drip   Devices    LINES / TUBES:      Continuous Infusions: . sodium chloride 50 mL/hr at 04/18/15 1927    Objective: VITAL SIGNS: Temp: 97.4 F (36.3 C) (12/24 0854) Temp Source: Axillary (12/24 0854) BP: 111/69 mmHg (12/24 0900) Pulse Rate: 47 (12/24 0900) SPO2; FIO2:  Vent Mode:   Intake/Output Summary (Last 24 hours) at 04/20/15 1013 Last data filed at 04/20/15 0854  Gross per 24 hour  Intake   1357 ml  Output    900 ml  Net    457 ml     Exam: General: A/O 4, cachectic, increased WOB, positive acute and chronic respiratory distress (currently on nonrebreather) Eyes: Negative headache,negative scleral hemorrhage ENT: Negative Runny nose, negative ear pain, negative gingival bleeding, Neck:  Negative scars, masses, torticollis, lymphadenopathy, JVD Lungs: significantly tachypneic, diffuse coarse breath sounds with expiratory wheezing, negative crackles  Cardiovascular: Tachycardic, Regular rate and rhythm without murmur gallop or rub normal S1 and S2 Abdomen:negative abdominal pain, nondistended, positive soft, bowel sounds, no rebound, no ascites, no appreciable mass Extremities: No significant cyanosis, clubbing, or edema bilateral lower  extremities Psychiatric:  Negative depression, negative anxiety, negative fatigue, negative mania  Neurologic:  Cranial nerves II through XII intact, tongue/uvula midline, all extremities muscle strength 5/5, sensation intact throughout, negative dysarthria, negative expressive aphasia, negative receptive aphasia.   Data Reviewed: Basic Metabolic Panel:  Recent Labs Lab 04/14/15 0532 04/15/15 0500 04/16/15 0422 04/17/15 0430 04/18/15 0422 04/19/15 0532 04/20/15 0442  NA 141 143 144 146* 146*  --   --   K 3.2* 3.8 4.7 4.4 4.7  --   --   CL 109 112* 116* 115* 115*  --   --   CO2 25 25 22 22  20*  --   --   GLUCOSE 119* 128* 133* 140* 156*  --   --   BUN 29* 26* 39* 42* 47*  --   --   CREATININE 1.66* 1.60* 2.39* 2.24* 2.21*  --   --   CALCIUM 8.0* 8.3* 8.4* 8.5* 8.3*  --   --   MG 2.4 2.4 2.4 2.4 2.5* 2.6* 2.8*   Liver Function Tests:  Recent Labs Lab 04/14/15 0532 04/15/15 0500 04/16/15 0422 04/17/15 0430 04/18/15 0422  AST 144* 60* 864* 431* 500*  ALT 432* 293* 578* 507* 534*  ALKPHOS 86 83 122 102 97  BILITOT 0.6 0.8 0.6 0.7 0.9  PROT 5.1* 5.2* 5.4* 5.2* 5.5*  ALBUMIN 1.9* 2.0* 2.0* 2.0* 2.0*   No results for input(s): LIPASE, AMYLASE in the last 168 hours. No results for input(s): AMMONIA in the last 168 hours. CBC:  Recent Labs Lab 04/16/15 0422 04/17/15 0430 04/18/15 0422  04/19/15 0532 04/20/15 0442  WBC 9.5 9.1 9.9 13.1* 13.1*  HGB 8.0* 7.7* 8.0* 7.9* 8.0*  HCT 27.3* 26.1* 26.9* 27.0* 27.3*  MCV 82.5 82.6 82.0 84.1 82.7  PLT 319 270 218 191 146*   Cardiac Enzymes: No results for input(s): CKTOTAL, CKMB, CKMBINDEX, TROPONINI in the last 168 hours. BNP (last 3 results)  Recent Labs  03/29/2015 1113  BNP 523.3*    ProBNP (last 3 results) No results for input(s): PROBNP in the last 8760 hours.  CBG:  Recent Labs Lab 04/19/15 1628 04/19/15 1959 04/19/15 2328 04/20/15 0403 04/20/15 0853  GLUCAP 129* 141* 129* 118* 133*    Recent Results  (from the past 240 hour(s))  Culture, blood (Routine X 2) w Reflex to ID Panel     Status: None   Collection Time: 04/12/2015 11:40 AM  Result Value Ref Range Status   Specimen Description BLOOD RIGHT CHEST PORTA CATH  Final   Special Requests BOTTLES DRAWN AEROBIC AND ANAEROBIC 5CC  Final   Culture NO GROWTH 5 DAYS  Final   Report Status 04/16/2015 FINAL  Final  Culture, blood (Routine X 2) w Reflex to ID Panel     Status: None   Collection Time: 04/01/2015 11:45 AM  Result Value Ref Range Status   Specimen Description BLOOD RIGHT ANTECUBITAL  Final   Special Requests BOTTLES DRAWN AEROBIC AND ANAEROBIC 10CC  Final   Culture NO GROWTH 5 DAYS  Final   Report Status 04/16/2015 FINAL  Final  Urine culture     Status: None   Collection Time: 04/08/2015 12:00 PM  Result Value Ref Range Status   Specimen Description URINE, CATHETERIZED  Final   Special Requests NONE  Final   Culture NO GROWTH 1 DAY  Final   Report Status 04/12/2015 FINAL  Final  MRSA PCR Screening     Status: None   Collection Time: 04/24/2015  9:25 PM  Result Value Ref Range Status   MRSA by PCR NEGATIVE NEGATIVE Final    Comment:        The GeneXpert MRSA Assay (FDA approved for NASAL specimens only), is one component of a comprehensive MRSA colonization surveillance program. It is not intended to diagnose MRSA infection nor to guide or monitor treatment for MRSA infections.      Studies:  Recent x-ray studies have been reviewed in detail by the Attending Physician  Scheduled Meds:  Scheduled Meds: . diltiazem  120 mg Oral Daily  . insulin aspart  0-9 Units Subcutaneous TID WC    Time spent on care of this patient: 62 mins   Reyne Dumas , MD  [facility] Office  [phone] Pager - [phone]  On-Call/Text Page:      [email or web address]      password [email or web address]  If 7PM-7AM, please contact night-coverage [email or web address] Password [email or web address] 04/20/2015, 10:13 AM   LOS: 9 days   Care during the described  time interval was provided by me .  I have reviewed this patient's available data, including medical history, events of note, physical examination, and all test results as part of my evaluation. I have personally reviewed and interpreted all radiology studies.

## 2015-04-20 NOTE — Progress Notes (Signed)
Went by to FU with Jim Beck, but on BIPAP, non-verbal, with RR 38. Asked RN to administer MS04. Pt has been adamantly opposed to hospice and complete comfort care despite clinical decline in spite of aggressive medical; management . Called [name] tonight who states they know "this is it". She references that she received a call from a physician this am who told her' they have done all they can". She herself has not seen him in 2 days. I shared my fears that  he is at EOL , his symptoms could be better managed in a different setting. She states his daughter, Jim Beck, is his HCPOA and any changes other than what he has already shared verbally would have to be addressed with her. Her number is [phone]. Will attempt to contact [name] to discuss [place]. At this point he appears to not be able to come off BIPAP. Potnetial prognosis of just hours to days. Thank you, Romona Curls, ANP

## 2015-04-20 NOTE — Progress Notes (Signed)
Patient is displaying increased WOB, and decreasing SpO2 on the NRB (Was down to 83%) Patient is refusing the use of the BiPAP machine, so flow was increased on the NRB. Patient is up to 90% and RT will continue to monitor.

## 2015-04-20 NOTE — Progress Notes (Signed)
Patient with increased work of breathing, RR 40.Nurse asked "would you like Bipap machine to help you breath?" Patient answered "no". Morphine PRN dose given for comfort.Palliative NP paged.

## 2015-04-21 LAB — CBC
HEMATOCRIT: 27.6 % — AB (ref 39.0–52.0)
Hemoglobin: 8 g/dL — ABNORMAL LOW (ref 13.0–17.0)
MCH: 24.3 pg — ABNORMAL LOW (ref 26.0–34.0)
MCHC: 29 g/dL — ABNORMAL LOW (ref 30.0–36.0)
MCV: 83.9 fL (ref 78.0–100.0)
PLATELETS: 131 10*3/uL — AB (ref 150–400)
RBC: 3.29 MIL/uL — ABNORMAL LOW (ref 4.22–5.81)
RDW: 26.3 % — AB (ref 11.5–15.5)
WBC: 12.9 10*3/uL — AB (ref 4.0–10.5)

## 2015-04-21 LAB — COMPREHENSIVE METABOLIC PANEL
ALBUMIN: 2 g/dL — AB (ref 3.5–5.0)
ALT: 284 U/L — ABNORMAL HIGH (ref 17–63)
AST: 163 U/L — AB (ref 15–41)
Alkaline Phosphatase: 95 U/L (ref 38–126)
Anion gap: 11 (ref 5–15)
BILIRUBIN TOTAL: 1.3 mg/dL — AB (ref 0.3–1.2)
BUN: 55 mg/dL — AB (ref 6–20)
CHLORIDE: 128 mmol/L — AB (ref 101–111)
CO2: 22 mmol/L (ref 22–32)
Calcium: 8.9 mg/dL (ref 8.9–10.3)
Creatinine, Ser: 2.22 mg/dL — ABNORMAL HIGH (ref 0.61–1.24)
GFR calc Af Amer: 30 mL/min — ABNORMAL LOW (ref >60)
GFR calc non Af Amer: 26 mL/min — ABNORMAL LOW (ref >60)
GLUCOSE: 148 mg/dL — AB (ref 65–99)
POTASSIUM: 5 mmol/L (ref 3.5–5.1)
SODIUM: 161 mmol/L — AB (ref 135–145)
Total Protein: 5.5 g/dL — ABNORMAL LOW (ref 6.5–8.1)

## 2015-04-21 LAB — GLUCOSE, CAPILLARY
GLUCOSE-CAPILLARY: 109 mg/dL — AB (ref 65–99)
Glucose-Capillary: 106 mg/dL — ABNORMAL HIGH (ref 65–99)
Glucose-Capillary: 108 mg/dL — ABNORMAL HIGH (ref 65–99)
Glucose-Capillary: 116 mg/dL — ABNORMAL HIGH (ref 65–99)

## 2015-04-21 MED ORDER — SODIUM CHLORIDE 0.45 % IV SOLN: INTRAVENOUS | Status: DC

## 2015-04-21 MED ORDER — LORAZEPAM 2 MG/ML IJ SOLN
1.0000 mg | Freq: Once | INTRAMUSCULAR | Status: AC
  Administered 2015-04-21: 1 mg via INTRAVENOUS
  Filled 2015-04-21: qty 1

## 2015-04-21 MED ORDER — SODIUM CHLORIDE 0.45 % IV SOLN
INTRAVENOUS | Status: DC
  Administered 2015-04-21: 06:00:00 via INTRAVENOUS

## 2015-04-21 MED ORDER — MORPHINE SULFATE (PF) 2 MG/ML IV SOLN
2.0000 mg | INTRAVENOUS | Status: DC
  Administered 2015-04-21 – 2015-04-22 (×5): 2 mg via INTRAVENOUS
  Filled 2015-04-21 (×4): qty 1

## 2015-04-21 MED ORDER — LORAZEPAM 2 MG/ML IJ SOLN
1.0000 mg | INTRAMUSCULAR | Status: DC | PRN
Start: 2015-04-21 — End: 2015-04-22

## 2015-04-21 NOTE — Progress Notes (Signed)
Called to bedside following patient desaturation on 100% non-rebreather, patient placed back on BiPAP on 100% and sats have increased to 91%. RT will continue to monitor.

## 2015-04-21 NOTE — Progress Notes (Signed)
CRITICAL VALUE ALERT  Critical value received:  Na+ 161  Date of notification:  04/21/2015  Time of notification:  [id number]  Critical value read back: YES  Nurse who received alert:  Eulis Foster RN  MD notified (1st page):  Silvio Clayman  Time of first page: [phone]  MD notified (2nd page):  Time of second page:  Responding MD:  Silvio Clayman  Time MD responded:  [phone]

## 2015-04-21 NOTE — Progress Notes (Signed)
04/21/2015 Central monitoring called patient had flutter waves and heart rate in the 130's at 1942. Rn made third shift nurse aware. Rn went in patient room he was in a fib and heart rate was in the 130's. [facility] RN.

## 2015-04-21 NOTE — Progress Notes (Signed)
Pt is agitated and keep taking his BiPAP off

## 2015-04-21 NOTE — Progress Notes (Signed)
04/21/2015 patient blood pressure as 76/63, heart rate 139, temp was 96.0 axillary. Dr Allyson Sabal was made aware and no orders given. Arthur Aydelotte RN

## 2015-04-21 NOTE — Progress Notes (Signed)
Message sent to Dr Allyson Sabal asking if for ativan order to help pt relax and tolerate bipap. RT suggested pt may benefit and relax more so with ativan and help pt tolerate bipap. Also sent message inquiring about restraints if necessary to keep bipap on.    Order received for a one time dose of ativan 1mg  IV only.  No family in today.

## 2015-04-21 NOTE — Progress Notes (Signed)
Pt refusing to wear BiPAP. Put him on non re-breather on 15 l/min. But desats in 76%. RT was called to put him back on BiPAP. Pt is uncomfortable on BiPAP.

## 2015-04-21 NOTE — Progress Notes (Signed)
RT called to bedside by RN. Pt has again removed BiPap, has removed several times this AM. Sp02 in 70s. HR 138, RR 29. RT & RN replaced BiPap mask. Pt tolerating at this time.

## 2015-04-21 NOTE — Progress Notes (Addendum)
[place] TEAM 1 - Stepdown/ICU TEAM Progress Note  Jim Beck [id number] DOB: [birth year] DOA: 03/29/2015 PCP: Kandice Hams, MD  Admit HPI / Brief Narrative: 81yo BM PMHx Depression, former Smoker, COPD, HTN, MI, HLD, remote hx Polysubstance Abuse, Diverticulosis, Colonic Polyps-Adenomatous, Hodgkin's lymphoma (initially dx 1979, recurrence 2011) S/P Chemotherapy with  last dose 03/27/15. Just admitted 12/8-12/14 with HCAP, sepsis, discharged on ceftin. Previously lived at home with his wife but d/c to SNF 12/14 r/t significant weakness, debilitation, malnutrition.   On am 12/15 he was found unresponsive at [facility] and brought to [facility] ED. On arrival, O2 sats 64%, BP 84/61, lactate 9.5. Placed on NRB, given IVF and PCCM consulted for ICU admission.   Pt states he is feeling better since arrival. Thinks that this was prompted by him taking off his nasal cannula to clean it and forgetting to put it back on. He c/o productive cough but unable to fully expectorate due to weakness, also c/o malaise and mild SOB. Denies hemoptysis, fevers, chills.    HPI/Subjective: Difficulty in maintaining on BiPAP overnight, He is now unable to follow commands, currently on 100% FiO2, discussed with the patient's spouse, made her aware that patient can decompensate and possibly expire over the weekend.   Assessment/Plan: Acute hypoxic respiratory failure/ Sepsis/HCAP worsening desite prior treatment/COPD exacerbation Has not been able to wean off the BiPAP for several days now, FiO2 requirements increasing by the day  Repeat chest x-ray shows no change in left base but right basilar opacity appears to be worse, suspect recurrent aspiration , this cannot be prevented even with strict aspiration precautions, and the fact that the patient is on the BiPAP, it would be hard to prevent recurrent aspiration Very tachycardic hypoxic agitated and confused last night Continue BiPAP  as Say[place] as we can Antibiotics  discontinued, patient has been on antibiotics since 12/15, prior to that he was on antibiotics for 7 days, continuing antibiotics would be futile at this time, discussed with the patient's wife -Patient refused  hospice. Patient remains NPO   Acute metabolic encephalopathy-worsening mental status, now hypernatremia secondary to NPO status, suspected IV fluids making his hypoxemia worse in the setting of chronic diastolic heart failure DC IV fluids  - HTN  -Currently controlled normal medication monitor  Paroxysmal AFib Was  on diltiazem, IV metoprolol pushes, Cardizem decreased from 180 mg> 120 mg /day,  bradycardic yesterday, tachycardic today, now on Cardizem CD, will use 30 mg of diltiazem Q8HRS  Continue IV metoprolol prn for heart rate greater than 110 unable to take eliquis as the patient is NPO, heparin drip discontinued because of decreasing platelet count   Thrombocytopenia-platelet count continues to go down, heparin drip discontinued, HIT panel pending   Dilated cardiomyopathy - EF 55% (Echo 04/05/15) -Strict in and out since admission + 6.8 L -Daily weight;              12/ 20 had bed weight= 92.5 kg -Normal CHF medication on hold secondary to hypotension  Pulmonary hypertension  Acute on chronic renal failure stage IV ( baseline Scr ~ 1.1) Now 2.22 -See dilated cardiomyopathy  Protein calorie malnutrition  Nothing by mouth, high risk of aspiration  Anemia chronic disease Hemoglobin stable around 8.0,   Hodgkin's lymphoma  - recurrent? S/p chemo (last dose 11/30) -12/18 contacted Oncology to let them know patient was readmitted and ask for consult on possible treatment options -12/20 spoke Dr. Curt Bears oncology and he stated there was no further treatment that  could be offered concerning his lymphoma.   Hypokalemia -Potassium goal> 4 -Potassium IV 50 mEq  Hyperglycemia  -Sensitive SSI   Goals of care -Palliative care consult; hypoxemic  respiratory failure, with possible recurrent non-Hodgkin's lymphoma. Continue palliative care discussions -12/20 Again concerning the fact that more than likely he will not survive this hospitalization . Declined hospice    Code Status: DO NOT RESUSCITATE Family Communication: no family present at time of exam Disposition Plan:  Poor  prognosis, nothing by mouth,  Will not survive this hospitalization, called  spouse and discussed as above    Consultants: Dr.Daniel Lily Kocher PCCM phone consult Dr. Luvenia Redden pathologist Phone consult Dr. Curt Bears oncology   Procedure/Significant Events: 12/9 echocardiogram;- LVEF= 55% to 60%. - Ventricular septum: contour showed diastolic flattening and systolic flattening. - Right ventricle: moderately to severelydilated. The moderator band was prominent.  - Right atrium:  mildly to moderately dilated. - Tricuspid valve: moderateregurgitation. - PA peak pressure: 54 mm Hg (S).   Culture 12/15 blood right chest Port-A-Cath NGTD 12/15 blood right AC NGTD 12/15 urine negative final 12/15 MRSA by PCR negative   Antibiotics: 12/15 Vanc>>>12/16 12/15zosyn>>>12/16 12/16 Imipenem>>> 12/16 zyvox>>>   DVT prophylaxis: Heparin drip   Devices    LINES / TUBES:      Continuous Infusions: . sodium chloride 75 mL/hr at 04/21/15 0605    Objective: VITAL SIGNS: Temp: 97.4 F (36.3 C) (12/25 0428) Temp Source: Axillary (12/25 0428) BP: 99/77 mmHg (12/25 0950) Pulse Rate: 138 (12/25 0950) SPO2; FIO2:  Vent Mode:   Intake/Output Summary (Last 24 hours) at 04/21/15 1017 Last data filed at 04/21/15 0400  Gross per 24 hour  Intake    754 ml  Output   1000 ml  Net   -246 ml     Exam: General: A/O 4, cachectic, increased WOB, positive acute and chronic respiratory distress (currently on nonrebreather) Eyes: Negative headache,negative scleral hemorrhage ENT: Negative Runny nose, negative ear pain, negative  gingival bleeding, Neck:  Negative scars, masses, torticollis, lymphadenopathy, JVD Lungs: significantly tachypneic, diffuse coarse breath sounds with expiratory wheezing, negative crackles  Cardiovascular: Tachycardic, Regular rate and rhythm without murmur gallop or rub normal S1 and S2 Abdomen:negative abdominal pain, nondistended, positive soft, bowel sounds, no rebound, no ascites, no appreciable mass Extremities: No significant cyanosis, clubbing, or edema bilateral lower extremities Psychiatric:  Negative depression, negative anxiety, negative fatigue, negative mania  Neurologic:  Cranial nerves II through XII intact, tongue/uvula midline, all extremities muscle strength 5/5, sensation intact throughout, negative dysarthria, negative expressive aphasia, negative receptive aphasia.   Data Reviewed: Basic Metabolic Panel:  Recent Labs Lab 04/15/15 0500 04/16/15 0422 04/17/15 0430 04/18/15 0422 04/19/15 0532 04/20/15 0442 04/21/15 0444  NA 143 144 146* 146*  --   --  161*  K 3.8 4.7 4.4 4.7  --   --  5.0  CL 112* 116* 115* 115*  --   --  128*  CO2 25 22 22  20*  --   --  22  GLUCOSE 128* 133* 140* 156*  --   --  148*  BUN 26* 39* 42* 47*  --   --  55*  CREATININE 1.60* 2.39* 2.24* 2.21*  --   --  2.22*  CALCIUM 8.3* 8.4* 8.5* 8.3*  --   --  8.9  MG 2.4 2.4 2.4 2.5* 2.6* 2.8*  --    Liver Function Tests:  Recent Labs Lab 04/15/15 0500 04/16/15 0422 04/17/15 0430 04/18/15 0422 04/21/15 0444  AST 60* 864* 431* 500* 163*  ALT 293* 578* 507* 534* 284*  ALKPHOS 83 122 102 97 95  BILITOT 0.8 0.6 0.7 0.9 1.3*  PROT 5.2* 5.4* 5.2* 5.5* 5.5*  ALBUMIN 2.0* 2.0* 2.0* 2.0* 2.0*   No results for input(s): LIPASE, AMYLASE in the last 168 hours. No results for input(s): AMMONIA in the last 168 hours. CBC:  Recent Labs Lab 04/17/15 0430 04/18/15 0422 04/19/15 0532 04/20/15 0442 04/21/15 0444  WBC 9.1 9.9 13.1* 13.1* 12.9*  HGB 7.7* 8.0* 7.9* 8.0* 8.0*  HCT 26.1* 26.9*  27.0* 27.3* 27.6*  MCV 82.6 82.0 84.1 82.7 83.9  PLT 270 218 191 146* 131*   Cardiac Enzymes: No results for input(s): CKTOTAL, CKMB, CKMBINDEX, TROPONINI in the last 168 hours. BNP (last 3 results)  Recent Labs  04/20/2015 1113  BNP 523.3*    ProBNP (last 3 results) No results for input(s): PROBNP in the last 8760 hours.  CBG:  Recent Labs Lab 04/20/15 1206 04/20/15 1616 04/20/15 2006 04/21/15 0026 04/21/15 0821  GLUCAP 118* 114* 107* 108* 116*    Recent Results (from the past 240 hour(s))  Culture, blood (Routine X 2) w Reflex to ID Panel     Status: None   Collection Time: 04/15/2015 11:40 AM  Result Value Ref Range Status   Specimen Description BLOOD RIGHT CHEST PORTA CATH  Final   Special Requests BOTTLES DRAWN AEROBIC AND ANAEROBIC 5CC  Final   Culture NO GROWTH 5 DAYS  Final   Report Status 04/16/2015 FINAL  Final  Culture, blood (Routine X 2) w Reflex to ID Panel     Status: None   Collection Time: 03/28/2015 11:45 AM  Result Value Ref Range Status   Specimen Description BLOOD RIGHT ANTECUBITAL  Final   Special Requests BOTTLES DRAWN AEROBIC AND ANAEROBIC 10CC  Final   Culture NO GROWTH 5 DAYS  Final   Report Status 04/16/2015 FINAL  Final  Urine culture     Status: None   Collection Time: 03/28/2015 12:00 PM  Result Value Ref Range Status   Specimen Description URINE, CATHETERIZED  Final   Special Requests NONE  Final   Culture NO GROWTH 1 DAY  Final   Report Status 04/12/2015 FINAL  Final  MRSA PCR Screening     Status: None   Collection Time: 04/12/2015  9:25 PM  Result Value Ref Range Status   MRSA by PCR NEGATIVE NEGATIVE Final    Comment:        The GeneXpert MRSA Assay (FDA approved for NASAL specimens only), is one component of a comprehensive MRSA colonization surveillance program. It is not intended to diagnose MRSA infection nor to guide or monitor treatment for MRSA infections.      Studies:  Recent x-ray studies have been reviewed in  detail by the Attending Physician  Scheduled Meds:  Scheduled Meds: . antiseptic oral rinse  7 mL Mouth Rinse q12n4p  . chlorhexidine  15 mL Mouth Rinse BID  . diltiazem  30 mg Oral 3 times per day  . insulin aspart  0-9 Units Subcutaneous TID WC    Time spent on care of this patient: 33 mins   Reyne Dumas , MD  [facility] Office  [phone] Pager - [phone]  On-Call/Text Page:      [email or web address]      password [email or web address]  If 7PM-7AM, please contact night-coverage [email or web address] Password [email or web address] 04/21/2015, 10:17 AM   LOS: 10 days   Care during the described time interval  was provided by me .  I have reviewed this patient's available data, including medical history, events of note, physical examination, and all test results as part of my evaluation. I have personally reviewed and interpreted all radiology studies.

## 2015-04-21 NOTE — Progress Notes (Signed)
Daily Progress Note   Patient Name: Jim Beck       Date: 04/21/2015 DOB: 02-27-1933  Age: 81 y.o. MRN#: [id number] Attending Physician: Reyne Dumas, MD Primary Care Physician: Kandice Hams, MD Admit Date: 04/04/2015  Reason for Consultation/Follow-up: Establishing goals of care and Non pain symptom management  Subjective: Pt appears to be transitioning towards EOL. He is unable to come off BIPAP even briefly per nursing and RT who were at the bedside. He is now unable to follow commands. Trying to pull off mask. Question if he is developing terminal delirium. Pt at this point is unable to speak for himself. Prior to this point he has been adamant about no hospice, continuing BIPAP. No family in to visit thus far today Interval Events: Developing agitation Length of Stay: 10 days  Current Medications: Scheduled Meds:  . antiseptic oral rinse  7 mL Mouth Rinse q12n4p  . chlorhexidine  15 mL Mouth Rinse BID  . insulin aspart  0-9 Units Subcutaneous TID WC  .  morphine injection  2 mg Intravenous 6 times per day    Continuous Infusions: . sodium chloride 10 mL/hr at 04/21/15 1300    PRN Meds: LORazepam, metoprolol, morphine injection  Physical Exam: Physical Exam  Constitutional:  Acutely ill appearing man, on bipap  Cardiovascular:  Irrg, tachy  Pulmonary/Chest:  Labored breathing on BIPAP. Unable to come off BIPAP even briefly without increased resp distress and desat's. Pt becoming agitated and pulling off BIPAP  Neurological:  Minimally responsive to voice  Skin: Skin is warm and dry.  Psychiatric:  Agitated, restless. ? Developing terminal delirium                Vital Signs: BP 83/46 mmHg  Pulse 139  Temp(Src) 96.8 F (36 C) (Axillary)  Resp 22  Ht 5\' 11"   (1.803 m)  Wt 93.4 kg (205 lb 14.6 oz)  BMI 28.73 kg/m2  SpO2 96% SpO2: SpO2: 96 % O2 Device: O2 Device: Bi-PAP O2 Flow Rate: O2 Flow Rate (L/min): 15 L/min  Intake/output summary:  Intake/Output Summary (Last 24 hours) at 04/21/15 1632 Last data filed at 04/21/15 1400  Gross per 24 hour  Intake 928.75 ml  Output    600 ml  Net 328.75 ml   LBM: Last BM Date: 04/20/15 Baseline Weight: Weight:  83.6 kg (184 lb 4.9 oz) Most recent weight: Weight: 93.4 kg (205 lb 14.6 oz)       Palliative Assessment/Data: Flowsheet Rows        Most Recent Value   Intake Tab    Referral Department  Hospitalist   Unit at Time of Referral  Intermediate Care Unit   Palliative Care Primary Diagnosis  Pulmonary   Date Notified  04/13/15   Palliative Care Type  New Palliative care   Reason for referral  Clarify Goals of Care, Non-pain Symptom   Date of Admission  04/08/2015   Date first seen by Palliative Care  04/14/15   # of days Palliative referral response time  1 Day(s)   # of days IP prior to Palliative referral  2   Clinical Assessment    Palliative Performance Scale Score  30%   Pain Max last 24 hours  0   Pain Min Last 24 hours  0   Dyspnea Max Last 24 Hours  8   Dyspnea Min Last 24 hours  4   Nausea Max Last 24 Hours  0   Nausea Min Last 24 Hours  0   Anxiety Max Last 24 Hours  0   Anxiety Min Last 24 Hours  0   Psychosocial & Spiritual Assessment    Palliative Care Outcomes    Patient/Family meeting held?  Yes   Who was at the meeting?  pt   Palliative Care Outcomes  Improved non-pain symptom therapy, Clarified goals of care   Patient/Family wishes: Interventions discontinued/not started   PEG, Trach, Mechanical Ventilation   Palliative Care follow-up planned  Yes, Facility      Additional Data Reviewed: CBC    Component Value Date/Time   WBC 12.9* 04/21/2015 0444   WBC 3.8* 03/27/2015 1002   RBC 3.29* 04/21/2015 0444   RBC 3.57* 03/27/2015 1002   RBC 4.15* 08/01/2009 0440     HGB 8.0* 04/21/2015 0444   HGB 8.3* 03/27/2015 1002   HCT 27.6* 04/21/2015 0444   HCT 27.8* 03/27/2015 1002   PLT 131* 04/21/2015 0444   PLT 358 03/27/2015 1002   MCV 83.9 04/21/2015 0444   MCV 77.9* 03/27/2015 1002   MCH 24.3* 04/21/2015 0444   MCH 23.2* 03/27/2015 1002   MCHC 29.0* 04/21/2015 0444   MCHC 29.9* 03/27/2015 1002   RDW 26.3* 04/21/2015 0444   RDW 23.6* 03/27/2015 1002   LYMPHSABS 1.0 04/26/2015 1113   LYMPHSABS 0.9 03/27/2015 1002   MONOABS 1.5* 04/24/2015 1113   MONOABS 0.9 03/27/2015 1002   EOSABS 0.0 04/03/2015 1113   EOSABS 0.0 03/27/2015 1002   BASOSABS 0.0 04/12/2015 1113   BASOSABS 0.0 03/27/2015 1002    CMP     Component Value Date/Time   NA 161* 04/21/2015 0444   NA 141 03/27/2015 1002   K 5.0 04/21/2015 0444   K 4.0 03/27/2015 1002   CL 128* 04/21/2015 0444   CO2 22 04/21/2015 0444   CO2 25 03/27/2015 1002   GLUCOSE 148* 04/21/2015 0444   GLUCOSE 114 03/27/2015 1002   BUN 55* 04/21/2015 0444   BUN 17.7 03/27/2015 1002   CREATININE 2.22* 04/21/2015 0444   CREATININE 1.3 03/27/2015 1002   CALCIUM 8.9 04/21/2015 0444   CALCIUM 9.3 03/27/2015 1002   PROT 5.5* 04/21/2015 0444   PROT 6.6 03/27/2015 1002   ALBUMIN 2.0* 04/21/2015 0444   ALBUMIN 2.9* 03/27/2015 1002   AST 163* 04/21/2015 0444   AST 12 03/27/2015 1002  ALT 284* 04/21/2015 0444   ALT <9 03/27/2015 1002   ALKPHOS 95 04/21/2015 0444   ALKPHOS 49 03/27/2015 1002   BILITOT 1.3* 04/21/2015 0444   BILITOT 0.43 03/27/2015 1002   GFRNONAA 26* 04/21/2015 0444   GFRAA 30* 04/21/2015 0444       Problem List:  Patient Active Problem List   Diagnosis Date Noted  . COPD exacerbation ([place])   . Paroxysmal atrial fibrillation (H[facility])   . 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 Hypokalemia   . Palliative care encounter   . Acute respiratory failure with hypoxia ([place])   . Essential hypertension   . Pulmonary hypertension ([place])   .  Dilated cardiomyopathy ([place])   . Hodgkin lymphoma of spleen ([place])   . Acute on chronic renal failure ([place])   . Pneumonia   . Respiratory failure ([place]) 04/10/2015  . Atrial fibrillation ([place])   . Sepsis ([place])   . Protein-calorie malnutrition, severe 04/05/2015  . Weakness 04/04/2015  . Anemia 04/04/2015  . HCAP (healthcare-associated pneumonia) 04/04/2015  . COPD (chronic obstructive pulmonary disease) ([place]) 04/04/2015  . HTN (hypertension) 04/04/2015  . HLD (hyperlipidemia) 04/04/2015  . 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 Constipation 01/30/2015  . Neutropenia ([place]) 01/17/2015  . Encounter for antineoplastic chemotherapy 12/19/2014  . Hodgkin lymphoma ([place]) 11/12/2014  . NHL (non-Hodgkin's lymphoma) ([place]) 07/26/2014  . Personal history of colonic polyps - adenomas 11/30/2013  . GERD with stricture 02/05/2011     Palliative Care Assessment & Plan    1.Code Status:  DNR    Code Status Orders        Start     Ordered   04/18/2015 1358  Do not attempt resuscitation (DNR)   Continuous    Question Answer Comment  Maintain current active treatments Yes   Do not initiate new interventions Yes      04/27/2015 1357       2. Goals of Care/Additional Recommendations:  Need daughter Fidela [place] , who is HCPOA, to discuss with PMT or attending, DC of BIPAP in order to provide more comfort for pt as he appears to be reaching EOL. Her number is [phone]. Spoke to wife who defered this conversation to her daughter. Wife verblaized that she knows "this is it".  Limitations on Scope of Treatment: At this point still continuing BIPAP  Desire for further Chaplaincy support:no  Psycho-social Needs: Grief/Bereavement Support  3. Symptom Management:      1.Dyspnea: Pt struggling to breathe. Worried he is getting into crisis and that is one aspect of his              developing agitation. Will schedule MS04 2mg  q4 atc and 2-4 q2 prn.       2.   Agitation: Ativan 1-2 mg q4 PRN. Observed pt after administration of ativan 1mg , with little impact on agitation and no drop in BP  4. Palliative Prophylaxis:   Delirium Protocol, Frequent Pain Assessment and Turn Reposition  5. Prognosis: Hours - Days  6. Discharge Planning: Anticipate hospital death   Thank you for allowing the Palliative Medicine Team to assist in the care of this patient.   Time In: 1615 Time Out: 1635 Total Time 20 min Prolonged Time Billed  no         Dory Horn, NP  04/21/2015, 4:32 PM  Please contact Palliative Medicine Team phone at [phone] for questions and  concerns.

## 2015-04-22 LAB — COMPREHENSIVE METABOLIC PANEL
ALBUMIN: 2 g/dL — AB (ref 3.5–5.0)
ALK PHOS: 102 U/L (ref 38–126)
ALK PHOS: 97 U/L (ref 38–126)
ALT: 651 U/L — ABNORMAL HIGH (ref 17–63)
ALT: 920 U/L — AB (ref 17–63)
ANION GAP: 23 — AB (ref 5–15)
AST: 1403 U/L — ABNORMAL HIGH (ref 15–41)
AST: 2444 U/L — AB (ref 15–41)
Albumin: 2.1 g/dL — ABNORMAL LOW (ref 3.5–5.0)
BILIRUBIN TOTAL: 2.1 mg/dL — AB (ref 0.3–1.2)
BILIRUBIN TOTAL: 2 mg/dL — AB (ref 0.3–1.2)
BUN: 69 mg/dL — ABNORMAL HIGH (ref 6–20)
BUN: 71 mg/dL — AB (ref 6–20)
CO2: 12 mmol/L — ABNORMAL LOW (ref 22–32)
CO2: 9 mmol/L — ABNORMAL LOW (ref 22–32)
CREATININE: 4.06 mg/dL — AB (ref 0.61–1.24)
Calcium: 8.9 mg/dL (ref 8.9–10.3)
Calcium: 9 mg/dL (ref 8.9–10.3)
Chloride: 127 mmol/L — ABNORMAL HIGH (ref 101–111)
Chloride: >130 mmol/L (ref 101–111)
Creatinine, Ser: 3.85 mg/dL — ABNORMAL HIGH (ref 0.61–1.24)
GFR calc Af Amer: 15 mL/min — ABNORMAL LOW (ref >60)
GFR calc non Af Amer: 14 mL/min — ABNORMAL LOW (ref >60)
GFR, EST AFRICAN AMERICAN: 16 mL/min — AB (ref >60)
GFR, EST NON AFRICAN AMERICAN: 13 mL/min — AB (ref >60)
GLUCOSE: 34 mg/dL — AB (ref 65–99)
Glucose, Bld: 71 mg/dL (ref 65–99)
POTASSIUM: 6.3 mmol/L — AB (ref 3.5–5.1)
Potassium: 7 mmol/L (ref 3.5–5.1)
SODIUM: 162 mmol/L — AB (ref 135–145)
Sodium: 163 mmol/L (ref 135–145)
TOTAL PROTEIN: 5 g/dL — AB (ref 6.5–8.1)
Total Protein: 5.4 g/dL — ABNORMAL LOW (ref 6.5–8.1)

## 2015-04-22 LAB — GLUCOSE, CAPILLARY
GLUCOSE-CAPILLARY: 20 mg/dL — AB (ref 65–99)
GLUCOSE-CAPILLARY: 20 mg/dL — AB (ref 65–99)
GLUCOSE-CAPILLARY: 58 mg/dL — AB (ref 65–99)
GLUCOSE-CAPILLARY: 88 mg/dL (ref 65–99)
Glucose-Capillary: 41 mg/dL — CL (ref 65–99)

## 2015-04-22 LAB — CBC
HCT: 29.1 % — ABNORMAL LOW (ref 39.0–52.0)
HEMOGLOBIN: 8 g/dL — AB (ref 13.0–17.0)
MCH: 24.2 pg — AB (ref 26.0–34.0)
MCHC: 27.5 g/dL — AB (ref 30.0–36.0)
MCV: 87.9 fL (ref 78.0–100.0)
Platelets: 104 10*3/uL — ABNORMAL LOW (ref 150–400)
RBC: 3.31 MIL/uL — ABNORMAL LOW (ref 4.22–5.81)
RDW: 27.1 % — AB (ref 11.5–15.5)
WBC: 17.3 10*3/uL — ABNORMAL HIGH (ref 4.0–10.5)

## 2015-04-22 LAB — HEPARIN INDUCED PLATELET AB (HIT ANTIBODY): Heparin Induced Plt Ab: 0.285 OD (ref 0.000–0.400)

## 2015-04-22 MED ORDER — DEXTROSE 50 % IV SOLN
INTRAVENOUS | Status: AC
  Administered 2015-04-22 (×2): 25 mL
  Filled 2015-04-22: qty 50

## 2015-04-22 MED ORDER — DEXTROSE 50 % IV SOLN
INTRAVENOUS | Status: AC
  Administered 2015-04-22: 50 mL
  Filled 2015-04-22: qty 50

## 2015-04-23 ENCOUNTER — Inpatient Hospital Stay (HOSPITAL_COMMUNITY): Payer: Medicare Other

## 2015-04-23 LAB — GLUCOSE, CAPILLARY: Glucose-Capillary: <10 mg/dL — CL (ref 65–99)

## 2015-04-24 ENCOUNTER — Ambulatory Visit: Payer: Medicare Other

## 2015-04-24 ENCOUNTER — Ambulatory Visit: Payer: Medicare Other | Admitting: Internal Medicine

## 2015-04-24 ENCOUNTER — Other Ambulatory Visit: Payer: Medicare Other

## 2015-04-28 NOTE — Progress Notes (Signed)
Patient's CBG reading 20 this am.  STAT CMP obtained. 25 ml of  Dextrose 50% given @0745 .  Dr. Allyson Sabal notified.  Dextrose 10% started at [facility] per her order.  Repeat CBG still reading 20. 25 ml of Dextrose 50% repeated.  Will continue to monitor.

## 2015-04-28 NOTE — Progress Notes (Signed)
CRITICAL VALUE ALERT  Critical value received:  Sodium (163), Potassium (7.0), Chloride (>130), Glucose (34)  Date of notification:  04-28-15  Time of notification:  0848  Critical value read back:Yes.    Nurse who received alert:  Rico Sheehan RN (let Angelica (RN) aware)  MD notified (1st page):  Dr Allyson Sabal  Time of first page:  0900  MD notified (2nd page):  Time of second page:  Responding MD:  [phone]  Time MD responded: Dr Allyson Sabal

## 2015-04-28 NOTE — Progress Notes (Signed)
Daily Progress Note   Patient Name: Jim Beck       Date: 05-15-15 DOB: [birth year]  Age: 82 y.o. MRN#: [id number] Attending Physician: Reyne Dumas, MD Primary Care Physician: Kandice Hams, MD Admit Date: 03/28/2015  Reason for Consultation/Follow-up: Psychosocial/spiritual support and Terminal Care  Subjective: Patient's bedside nurse called informing the plan is for comfort care with discontinuation of BiPAP. I called his daughter to discuss plan with her and she states that her family is okay with proceeding as they have all said their goodbyes.  On arrival to the floor, I discussed with bedside nurse and respiratory therapy. We premedicated patient with dose of morphine. BiPAP was discontinued. He did not show significant spontaneous respirations following DC of BiPAP. Patient expired short time later without need for additional comfort medications.  Length of Stay: 11 days  Current Medications: Scheduled Meds:  . antiseptic oral rinse  7 mL Mouth Rinse q12n4p  . chlorhexidine  15 mL Mouth Rinse BID  .  morphine injection  2 mg Intravenous 6 times per day    Continuous Infusions:    PRN Meds: LORazepam, morphine injection  Physical Exam: Physical Exam         Pupils fixed and dilated. No corneal reflex. Does respond to verbal or tactile stimulation, including no response to painful stimuli. No auscultatable respiratory or cardiac activity for greater than one minute. No palpable central or peripheral pulses.      Vital Signs: BP 102/55 mmHg  Pulse 49  Temp(Src) 96.1 F (35.6 C) (Axillary)  Resp 15  Ht 5\' 11"  (1.803 m)  Wt 93.8 kg (206 lb 12.7 oz)  BMI 28.85 kg/m2  SpO2 97% SpO2: SpO2:  (Unable to obtain an acurate SAT at this time.) O2 Device: O2 Device:  Bi-PAP O2 Flow Rate: O2 Flow Rate (L/min): 15 L/min  Intake/output summary:  Intake/Output Summary (Last 24 hours) at 05/15/2015 1248 Last data filed at May 15, 2015 0900  Gross per 24 hour  Intake 697.42 ml  Output    350 ml  Net 347.42 ml   LBM: Last BM Date: 04/20/15 Baseline Weight: Weight: 83.6 kg (184 lb 4.9 oz) Most recent weight: Weight: 93.8 kg (206 lb 12.7 oz)       Palliative Assessment/Data: Flowsheet Rows        Most Recent  Value   Intake Tab    Referral Department  Hospitalist   Unit at Time of Referral  Intermediate Care Unit   Palliative Care Primary Diagnosis  Pulmonary   Date Notified  04/13/15   Palliative Care Type  New Palliative care   Reason for referral  Clarify Goals of Care, Non-pain Symptom   Date of Admission  03/28/2015   Date first seen by Palliative Care  04/14/15   # of days Palliative referral response time  1 Day(s)   # of days IP prior to Palliative referral  2   Clinical Assessment    Palliative Performance Scale Score  30%   Pain Max last 24 hours  0   Pain Min Last 24 hours  0   Dyspnea Max Last 24 Hours  8   Dyspnea Min Last 24 hours  4   Nausea Max Last 24 Hours  0   Nausea Min Last 24 Hours  0   Anxiety Max Last 24 Hours  0   Anxiety Min Last 24 Hours  0   Psychosocial & Spiritual Assessment    Palliative Care Outcomes    Patient/Family meeting held?  Yes   Who was at the meeting?  pt   Palliative Care Outcomes  Improved non-pain symptom therapy, Clarified goals of care   Patient/Family wishes: Interventions discontinued/not started   PEG, Trach, Mechanical Ventilation   Palliative Care follow-up planned  Yes, Facility      Additional Data Reviewed: CBC    Component Value Date/Time   WBC 17.3* 05/18/15 0443   WBC 3.8* 03/27/2015 1002   RBC 3.31* 05-18-15 0443   RBC 3.57* 03/27/2015 1002   RBC 4.15* 08/01/2009 0440   HGB 8.0* May 18, 2015 0443   HGB 8.3* 03/27/2015 1002   HCT 29.1* May 18, 2015 0443   HCT 27.8* 03/27/2015  1002   PLT 104* 05-18-15 0443   PLT 358 03/27/2015 1002   MCV 87.9 2015/05/18 0443   MCV 77.9* 03/27/2015 1002   MCH 24.2* May 18, 2015 0443   MCH 23.2* 03/27/2015 1002   MCHC 27.5* 18-May-2015 0443   MCHC 29.9* 03/27/2015 1002   RDW 27.1* 18-May-2015 0443   RDW 23.6* 03/27/2015 1002   LYMPHSABS 1.0 04/12/2015 1113   LYMPHSABS 0.9 03/27/2015 1002   MONOABS 1.5* 04/08/2015 1113   MONOABS 0.9 03/27/2015 1002   EOSABS 0.0 04/04/2015 1113   EOSABS 0.0 03/27/2015 1002   BASOSABS 0.0 04/05/2015 1113   BASOSABS 0.0 03/27/2015 1002    CMP     Component Value Date/Time   NA 163* May 18, 2015 0750   NA 141 03/27/2015 1002   K 7.0* May 18, 2015 0750   K 4.0 03/27/2015 1002   CL >130* 18-May-2015 0750   CO2 9* 05/18/15 0750   CO2 25 03/27/2015 1002   GLUCOSE 34* 05-18-2015 0750   GLUCOSE 114 03/27/2015 1002   BUN 71* 05-18-15 0750   BUN 17.7 03/27/2015 1002   CREATININE 4.06* May 18, 2015 0750   CREATININE 1.3 03/27/2015 1002   CALCIUM 9.0 18-May-2015 0750   CALCIUM 9.3 03/27/2015 1002   PROT 5.0* 05/18/15 0750   PROT 6.6 03/27/2015 1002   ALBUMIN 2.0* 2015/05/18 0750   ALBUMIN 2.9* 03/27/2015 1002   AST 2444* May 18, 2015 0750   AST 12 03/27/2015 1002   ALT 920* 2015-05-18 0750   ALT <9 03/27/2015 1002   ALKPHOS 97 05/18/2015 0750   ALKPHOS 49 03/27/2015 1002   BILITOT 2.0* 05/18/15 0750   BILITOT 0.43 03/27/2015 1002  GFRNONAA 13* 05/10/2015 0750   GFRAA 15* May 10, 2015 0750       Problem List:  Patient Active Problem List   Diagnosis Date Noted  . COPD exacerbation ([place])   . Paroxysmal atrial fibrillation (HCC)   . 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 Hypokalemia   . Palliative care encounter   . Acute respiratory failure with hypoxia ([place])   . Essential hypertension   . Pulmonary hypertension ([place])   . Dilated cardiomyopathy ([place])   . Hodgkin lymphoma of spleen ([place])   . Acute on chronic renal failure ([place])     . Pneumonia   . Respiratory failure ([place]) 04/03/2015  . Atrial fibrillation ([place])   . Sepsis ([place])   . Protein-calorie malnutrition, severe 04/05/2015  . Weakness 04/04/2015  . Anemia 04/04/2015  . HCAP (healthcare-associated pneumonia) 04/04/2015  . COPD (chronic obstructive pulmonary disease) ([place]) 04/04/2015  . HTN (hypertension) 04/04/2015  . HLD (hyperlipidemia) 04/04/2015  . 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 Constipation 01/30/2015  . Neutropenia ([place]) 01/17/2015  . Encounter for antineoplastic chemotherapy 12/19/2014  . Hodgkin lymphoma ([place]) 11/12/2014  . NHL (non-Hodgkin's lymphoma) ([place]) 07/26/2014  . Personal history of colonic polyps - adenomas 11/30/2013  . GERD with stricture 02/05/2011     Palliative Care Assessment & Plan    1.Code Status:  DNR    Code Status Orders        Start     Ordered   04/21/2015 1358  Do not attempt resuscitation (DNR)   Continuous    Question Answer Comment  Maintain current active treatments Yes   Do not initiate new interventions Yes      04/14/2015 1357       2. Goals of Care/Additional Recommendations:  Patient was transition to full comfort care. I called and spoke with his daughter, [name].   Discussed plan for medications for comfort as well as discontinuation of BiPAP.  She reported that her family has their goodbyes would like to proceed  with discontinuation  of BiPAP.   Patient was premedicated with morphine followed by DC of BiPAP. He expired shortly afterward. I was present for patient death.  see separate note for pronouncement     Limitations on Scope of Treatment: Full Comfort Care  Desire for further Chaplaincy support: No  Psycho-social Needs: Patient expired  3. Symptom Management:   4. Palliative Prophylaxis:   Patient Expired  5. Prognosis: Patient expired  6. Discharge Planning:  Patient expired   Care plan was discussed with patient, family, bedside nurse.   Attending notified via page.  Thank you for allowing the Palliative Medicine Team to assist in the care of this patient.   Time In: 1200 Time Out: [id number] Total Time 45 Prolonged Time Billed  no         Micheline Rough, MD  05/10/15, 12:48 PM  Please contact Palliative Medicine Team phone at [phone] for questions and concerns.

## 2015-04-28 NOTE — Progress Notes (Signed)
[place] TEAM 1 - Stepdown/ICU TEAM Progress Note  Jim Beck [id number] DOB: 1932-12-16 DOA: 04/07/2015 PCP: Kandice Hams, MD  Admit HPI / Brief Narrative: 82yo BM PMHx Depression, former Smoker, COPD, HTN, MI, HLD, remote hx Polysubstance Abuse, Diverticulosis, Colonic Polyps-Adenomatous, Hodgkin's lymphoma (initially dx 1979, recurrence 2011) S/P Chemotherapy with  last dose 03/27/15. Just admitted 12/8-12/14 with HCAP, sepsis, discharged on ceftin. Previously lived at home with his wife but d/c to SNF 12/14 r/t significant weakness, debilitation, malnutrition.   On am 12/15 he was found unresponsive at [facility] and brought to [facility] ED. On arrival, O2 sats 64%, BP 84/61, lactate 9.5. Placed on NRB, given IVF and PCCM consulted for ICU admission.   Pt states he is feeling better since arrival. Thinks that this was prompted by him taking off his nasal cannula to clean it and forgetting to put it back on. He c/o productive cough but unable to fully expectorate due to weakness, also c/o malaise and mild SOB. Denies hemoptysis, fevers, chills.    HPI/Subjective: Discussed with the patient's daughter [name] [phone], transition patient to full comfort care, discontinue BiPAP   Assessment/Plan: Acute hypoxic respiratory failure/ Sepsis/HCAP worsening desite prior treatment/COPD exacerbation Has not been able to wean off the BiPAP for several days now, FiO2 requirements increasing by the day  Repeat chest x-ray shows no change in left base but right basilar opacity appears to be worse, suspect recurrent aspiration , this cannot be prevented even with strict aspiration precautions, and the fact that the patient is on the BiPAP, it would be hard to prevent recurrent aspiration Very tachycardic hypoxic agitated and confused last night Antibiotics discontinued, patient has been on antibiotics since 12/15, prior to that he was on antibiotics for 7 days, continuing antibiotics would be  futile at this time, discussed with the patient's wife Discontinue BiPAP transition to for comfort care   Acute metabolic encephalopathy-worsening mental status, now hypernatremia secondary to NPO status, suspected IV fluids making his hypoxemia worse in the setting of chronic diastolic heart failure DC IV fluids  - HTN  -Currently controlled normal medication monitor  Paroxysmal AFib Was  on diltiazem, IV metoprolol pushes, Cardizem decreased from 180 mg> 120 mg /day,  bradycardic yesterday, tachycardic today, now on Cardizem CD, will use 30 mg of diltiazem Q8HRS  Discontinue IV metoprolol prn for heart rate greater than 110, now patient is full comfort care unable to take eliquis as the patient is NPO, heparin drip discontinued because of decreasing platelet count   Thrombocytopenia-platelet count continues to go down, heparin drip discontinued, HIT panel pending   Dilated cardiomyopathy - EF 55% (Echo 04/05/15) -Strict in and out since admission + 6.8 L -Daily weight;              12/ 20 had bed weight= 92.5 kg -Normal CHF medication on hold secondary to hypotension  Pulmonary hypertension  Acute on chronic renal failure stage IV ( baseline Scr ~ 1.1) Worsening, patient full comfort care  Protein calorie malnutrition  Nothing by mouth, high risk of aspiration  Anemia chronic disease Hemoglobin stable around 8.0,   Hodgkin's lymphoma  - recurrent? S/p chemo (last dose 11/30) -12/18 contacted Oncology to let them know patient was readmitted and ask for consult on possible treatment options -12/20 spoke Dr. Curt Bears oncology and he stated there was no further treatment that could be offered concerning his lymphoma.   Hypokalemia -Potassium goal> 4 -Potassium IV 50 mEq  Hyperglycemia  -Sensitive SSI  Goals of care -Palliative care consult; hypoxemic respiratory failure, liver failure, with possible recurrent non-Hodgkin's lymphoma. Continue palliative  care discussions 12/26-transition to full comfort care    Code Status: DO NOT RESUSCITATE Family Communication: no family present at time of exam Disposition Plan:  Poor  prognosis, nothing by mouth,  Will not survive this hospitalization, called  spouse and discussed as above    Consultants: [facility] phone consult Dr. Luvenia Redden pathologist Phone consult Dr. Curt Bears oncology   Procedure/Significant Events: 12/9 echocardiogram;- LVEF= 55% to 60%. - Ventricular septum: contour showed diastolic flattening and systolic flattening. - Right ventricle: moderately to severelydilated. The moderator band was prominent.  - Right atrium:  mildly to moderately dilated. - Tricuspid valve: moderateregurgitation. - PA peak pressure: 54 mm Hg (S).   Culture 12/15 blood right chest Port-A-Cath NGTD 12/15 blood right AC NGTD 12/15 urine negative final 12/15 MRSA by PCR negative   Antibiotics: 12/15 Vanc>>>12/16 12/15zosyn>>>12/16 12/16 Imipenem>>> 12/16 zyvox>>>   DVT prophylaxis: Heparin drip   Devices    LINES / TUBES:      Continuous Infusions:    Objective: VITAL SIGNS: Temp: 96 F (35.6 C) (12/26 0743) Temp Source: Axillary (12/26 0743) BP: 158/125 mmHg (12/26 0743) Pulse Rate: 37 (12/26 0400) SPO2; FIO2:  Vent Mode:   Intake/Output Summary (Last 24 hours) at 05-14-2015 1050 Last data filed at 2015-05-14 0900  Gross per 24 hour  Intake 697.42 ml  Output    350 ml  Net 347.42 ml     Exam: Acutely ill appearing man, on bipap  Cardiovascular:  Irrg, tachy  Pulmonary/Chest:  Labored breathing on BIPAP. Unable to come off BIPAP even briefly without increased resp distress and desat's. Pt becoming agitated and pulling off BIPAP  Neurological:  Minimally responsive to voice  Skin: Skin is warm and dry.  Psychiatric:  Agitated, restless. ? Developing terminal delirium   Data Reviewed: Basic Metabolic Panel:  Recent  Labs Lab 04/16/15 0422 04/17/15 0430 04/18/15 0422 04/19/15 0532 04/20/15 0442 04/21/15 0444 05/14/15 0443 2015/05/14 0750  NA 144 146* 146*  --   --  161* 162* 163*  K 4.7 4.4 4.7  --   --  5.0 6.3* 7.0*  CL 116* 115* 115*  --   --  128* 127* >130*  CO2 22 22 20*  --   --  22 12* 9*  GLUCOSE 133* 140* 156*  --   --  148* 71 34*  BUN 39* 42* 47*  --   --  55* 69* 71*  CREATININE 2.39* 2.24* 2.21*  --   --  2.22* 3.85* 4.06*  CALCIUM 8.4* 8.5* 8.3*  --   --  8.9 8.9 9.0  MG 2.4 2.4 2.5* 2.6* 2.8*  --   --   --    Liver Function Tests:  Recent Labs Lab 04/17/15 0430 04/18/15 0422 04/21/15 0444 05/14/15 0443 May 14, 2015 0750  AST 431* 500* 163* 1403* 2444*  ALT 507* 534* 284* 651* 920*  ALKPHOS 102 97 95 102 97  BILITOT 0.7 0.9 1.3* 2.1* 2.0*  PROT 5.2* 5.5* 5.5* 5.4* 5.0*  ALBUMIN 2.0* 2.0* 2.0* 2.1* 2.0*   No results for input(s): LIPASE, AMYLASE in the last 168 hours. No results for input(s): AMMONIA in the last 168 hours. CBC:  Recent Labs Lab 04/18/15 0422 04/19/15 0532 04/20/15 0442 04/21/15 0444 14-May-2015 0443  WBC 9.9 13.1* 13.1* 12.9* 17.3*  HGB 8.0* 7.9* 8.0* 8.0* 8.0*  HCT 26.9* 27.0*  27.3* 27.6* 29.1*  MCV 82.0 84.1 82.7 83.9 87.9  PLT 218 191 146* 131* 104*   Cardiac Enzymes: No results for input(s): CKTOTAL, CKMB, CKMBINDEX, TROPONINI in the last 168 hours. BNP (last 3 results)  Recent Labs  04/20/2015 1113  BNP 523.3*    ProBNP (last 3 results) No results for input(s): PROBNP in the last 8760 hours.  CBG:  Recent Labs Lab 04-27-2015 0740 27-Apr-2015 0810 April 27, 2015 0812 April 27, 2015 0846 04-27-15 0920  GLUCAP <10* 20* <10* 58* 88    No results found for this or any previous visit (from the past 240 hour(s)).   Studies:  Recent x-ray studies have been reviewed in detail by the Attending Physician  Scheduled Meds:  Scheduled Meds: . antiseptic oral rinse  7 mL Mouth Rinse q12n4p  . chlorhexidine  15 mL Mouth Rinse BID  .  morphine  injection  2 mg Intravenous 6 times per day    Time spent on care of this patient: 50 mins   Reyne Dumas , MD  [facility] Office  [phone] Pager - [phone]  On-Call/Text Page:      [email or web address]      password [email or web address]  If 7PM-7AM, please contact night-coverage [email or web address] Password [email or web address] Apr 27, 2015, 10:50 AM   LOS: 11 days   Care during the described time interval was provided by me .  I have reviewed this patient's available data, including medical history, events of note, physical examination, and all test results as part of my evaluation. I have personally reviewed and interpreted all radiology studies.

## 2015-04-28 NOTE — Progress Notes (Signed)
Pt. Was taken off BIPAP & placed on 2L [place] for comfort care with RN & MD at the bedside.

## 2015-04-28 NOTE — Final Progress Note (Signed)
Patient lying in bed in no distress.  Bipap removed and focus on comfort care.  Minimal spontaneous respiratory effort following removal of Bipap, followed by no respirations.  No signs of distress.  Pupils fixed and dilated. No corneal reflex. Does respond to verbal or tactile stimulation, including no response to painful stimuli. No auscultatable respiratory or cardiac activity for greater than one minute.  No palpable central or peripheral pulses.  Time of death 12:29 PM.  I called and notified his daughter, [name].  She is going to discuss funeral home selection with family.  Attending notified via page.  Micheline Rough, MD [place] Team [phone]

## 2015-04-28 NOTE — Discharge Summary (Signed)
Death note Patient expired in the presence of palliative care and his family, after removal from BiPAP, under full comfort care measures  Time of death 12:29 PM.    Admit HPI / Brief Narrative: 82yo BM PMHx Depression, former Smoker, COPD, HTN, MI, HLD, remote hx Polysubstance Abuse, Diverticulosis, Colonic Polyps-Adenomatous, Hodgkin's lymphoma (initially dx 1979, recurrence 2011) S/P Chemotherapy with last dose 03/27/15. Just admitted 12/8-12/14 with HCAP, sepsis, discharged on ceftin. Previously lived at home with his wife but d/c to SNF 12/14 r/t significant weakness, debilitation, malnutrition.   On am 12/15 he was found unresponsive at [facility] and brought to [facility] ED. On arrival, O2 sats 64%, BP 84/61, lactate 9.5. Placed on NRB, given IVF and PCCM consulted for ICU admission.   Pt states he is feeling better since arrival. Thinks that this was prompted by him taking off his nasal cannula to clean it and forgetting to put it back on. He c/o productive cough but unable to fully expectorate due to weakness, also c/o malaise and mild SOB. Denies hemoptysis, fevers, chills.    HPI/Subjective: Discussed with the patient's daughter [name] [phone], transition patient to full comfort care, discontinue BiPAP   Assessment/Plan: Acute hypoxic respiratory failure/ Sepsis/HCAP worsening desite prior treatment/COPD exacerbation Has not been able to wean off the BiPAP for several days now, FiO2 requirements increasing by the day  Repeat chest x-ray shows no change in left base but right basilar opacity appears to be worse, suspect recurrent aspiration , this cannot be prevented even with strict aspiration precautions, and the fact that the patient is on the BiPAP, it would be hard to prevent recurrent aspiration Very tachycardic hypoxic agitated and confused last night Antibiotics discontinued, patient has been on antibiotics since 12/15, prior to that he was on antibiotics for 7 days,  continuing antibiotics would be futile at this time, discussed with the patient's wife Discontinue BiPAP transition to for comfort care   Acute metabolic encephalopathy-worsening mental status, now hypernatremia secondary to NPO status, suspected IV fluids making his hypoxemia worse in the setting of chronic diastolic heart failure DC IV fluids  - HTN  -Currently controlled normal medication monitor  Paroxysmal AFib Was on diltiazem, IV metoprolol pushes, Cardizem decreased from 180 mg> 120 mg /day,  bradycardic yesterday, tachycardic today, now on Cardizem CD, will use 30 mg of diltiazem Q8HRS  Discontinue IV metoprolol prn for heart rate greater than 110, now patient is full comfort care unable to take eliquis as the patient is NPO, heparin drip discontinued because of decreasing platelet count  Thrombocytopenia-platelet count continues to go down, heparin drip discontinued, HIT panel pending  Dilated cardiomyopathy - EF 55% (Echo 04/05/15) -Strict in and out since admission + 6.8 L -Daily weight; 12/ 20 had bed weight= 92.5 kg -Normal CHF medication on hold secondary to hypotension  Pulmonary hypertension  Acute on chronic renal failure stage IV ( baseline Scr ~ 1.1) Worsening, patient full comfort care  Protein calorie malnutrition  Nothing by mouth, high risk of aspiration  Anemia chronic disease Hemoglobin stable around 8.0,  Hodgkin's lymphoma  - recurrent? S/p chemo (last dose 11/30) -12/18 contacted Oncology to let them know patient was readmitted and ask for consult on possible treatment options -12/20 spoke Dr. Curt Bears oncology and he stated there was no further treatment that could be offered concerning his lymphoma.   Hypokalemia -Potassium goal> 4 -Potassium IV 50 mEq  Hyperglycemia  -Sensitive SSI   Goals of care -Palliative care consult; hypoxemic respiratory  failure, liver failure, with possible recurrent  non-Hodgkin's lymphoma. Continue palliative care discussions 12/26-transition to full comfort care    Code Status: DO NOT RESUSCITATE

## 2015-04-28 NOTE — Progress Notes (Signed)
Speech Language Pathology Discharge Patient Details Name: Jim Beck MRN: [id number] DOB: 1932/09/21 Today's Date: 05/07/15 Time:  -     Patient discharged from SLP services secondary to medical decline - will need to re-order SLP to resume therapy services.  Please see latest therapy progress note for current level of functioning and progress toward goals.    Progress and discharge plan discussed with patient and/or caregiver: Patient unable to participate in discharge planning and no caregivers available  GO     [name] 05/07/15, 7:47 AM

## 2015-04-28 DEATH — deceased

## 2015-10-15 ENCOUNTER — Other Ambulatory Visit: Payer: Self-pay | Admitting: Nurse Practitioner

## 2017-07-14 IMAGING — CT NM PET TUM IMG INITIAL (PI) SKULL BASE T - THIGH
1 of 8 series · 1 of 25 positions shown · non-contrast
Comparison: CT neck/ chest dated 08/27/2014. PET-CT dated
12/03/2009.

CLINICAL DATA: Subsequent treatment strategy for Hodgkin's
lymphoma.

EXAM:
NUCLEAR MEDICINE PET SKULL BASE TO THIGH
TECHNIQUE: 9.05 mCi F-18 FDG was injected intravenously. Full-ring PET imaging
was performed from the skull base to thigh after the radiotracer. CT
data was obtained and used for attenuation correction and anatomic
localization.
FASTING BLOOD GLUCOSE:  Value: 83 mg/dl

[Series 4: ct sk_thigh 5.0 b31f · axial · 5.0mm · 0.88mm/px · 1 of 221 slices shown]
[im 221/221  brain]
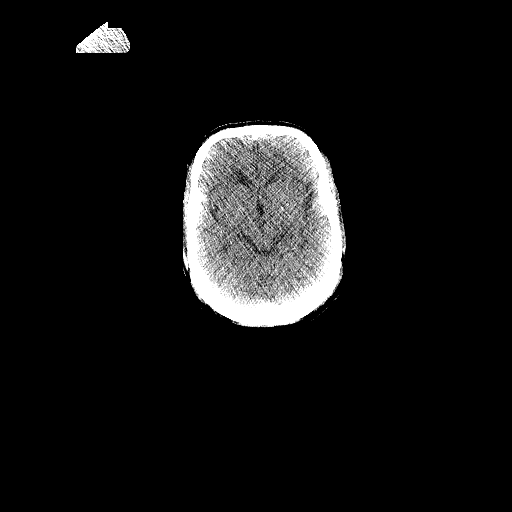

[1 of 25 positions shown; findings below may reference images not displayed]

FINDINGS: NECK

No hypermetabolic lymph nodes in the neck.

Mild asymmetric hypermetabolism in the right tonsil, without
correlate on recent enhanced CT, likely physiologic.

CHEST

8 x 6 mm nodule in the posterior right upper lobe (series 7/image
27), previously 7 x 6 mm in 1061, max SUV 0.8.

Underlying moderate centrilobular and paraseptal emphysematous
changes. Left apical pleural parenchymal scarring. No pleural
effusion or pneumothorax.

The heart is normal in size.  No pericardial effusion.

Postsurgical seroma in the left axilla related to prior
lymphadenectomy. Left axillary lymph nodes measuring up to 2.4 cm
short axis (series 4/image 55), max SUV 13.2.

Additional prevascular nodes measuring up to 12 mm short axis
(series 4/ image 63), max SUV 3.2. Small right paratracheal, AP
window, and subcarinal nodes measuring 8-9 mm short axis, max SUV
2.9. Mild right hilar metabolism, poorly evaluated on unenhanced CT,
max SUV 3.1.

ABDOMEN/PELVIS

No abnormal hypermetabolic activity within the liver, pancreas,
adrenal glands, or spleen. Spleen is normal in size.

Moderate hiatal hernia/inverted intrathoracic stomach.
Scarring/atrophy in the anterior right kidney. Atherosclerotic
calcifications of the abdominal aorta and branch vessels. Right
posterolateral bladder diverticulum. Small fat containing bilateral
inguinal hernias.

No hypermetabolic lymph nodes in the abdomen. 9 mm short axis right
inguinal node (series 4/image 25), max SUV 4.7.

SKELETON

No focal hypermetabolic activity to suggest skeletal metastasis.
IMPRESSION: Postsurgical changes/seroma in the left axilla related to prior left
axillary lymph node resection.

Hypermetabolic left axillary, mediastinal, and right hilar
lymphadenopathy, compatible with recurrent lymphoma.

Additional 9 mm hypermetabolic right inguinal node, suspicious.

Spleen is normal in size.

8 x 6 mm posterior right upper lobe nodule, minimally increased from
1061, non FDG avid but beneath the size threshold for PET
sensitivity. This is unlikely to be related to patient's lymphoma.
